# Patient Record
Sex: Female | Born: 1955
Health system: Southern US, Community
[De-identification: ages and names within clinical notes are randomized; demographics above are authoritative.]

## PROBLEM LIST (undated history)

## (undated) DIAGNOSIS — E669 Obesity, unspecified: Secondary | ICD-10-CM

## (undated) DIAGNOSIS — G4730 Sleep apnea, unspecified: Secondary | ICD-10-CM

## (undated) DIAGNOSIS — Z794 Long term (current) use of insulin: Secondary | ICD-10-CM

## (undated) DIAGNOSIS — F329 Major depressive disorder, single episode, unspecified: Secondary | ICD-10-CM

## (undated) DIAGNOSIS — K219 Gastro-esophageal reflux disease without esophagitis: Secondary | ICD-10-CM

## (undated) DIAGNOSIS — M549 Dorsalgia, unspecified: Secondary | ICD-10-CM

## (undated) DIAGNOSIS — F32A Depression, unspecified: Secondary | ICD-10-CM

## (undated) DIAGNOSIS — M1990 Unspecified osteoarthritis, unspecified site: Secondary | ICD-10-CM

## (undated) DIAGNOSIS — E119 Type 2 diabetes mellitus without complications: Secondary | ICD-10-CM

## (undated) DIAGNOSIS — E785 Hyperlipidemia, unspecified: Secondary | ICD-10-CM

## (undated) DIAGNOSIS — I10 Essential (primary) hypertension: Secondary | ICD-10-CM

## (undated) DIAGNOSIS — G8929 Other chronic pain: Secondary | ICD-10-CM

## (undated) DIAGNOSIS — Z789 Other specified health status: Secondary | ICD-10-CM

## (undated) HISTORY — DX: Depression, unspecified: F32.A

## (undated) HISTORY — DX: Other chronic pain: G89.29

## (undated) HISTORY — DX: Sleep apnea, unspecified: G47.30

## (undated) HISTORY — DX: Unspecified osteoarthritis, unspecified site: M19.90

## (undated) HISTORY — PX: ABDOMINAL HYSTERECTOMY: SHX81

## (undated) HISTORY — DX: Type 2 diabetes mellitus without complications: E11.9

## (undated) HISTORY — DX: Essential (primary) hypertension: I10

## (undated) HISTORY — DX: Dorsalgia, unspecified: M54.9

## (undated) HISTORY — DX: Major depressive disorder, single episode, unspecified: F32.9

## (undated) HISTORY — DX: Obesity, unspecified: E66.9

## (undated) HISTORY — DX: Hyperlipidemia, unspecified: E78.5

## (undated) HISTORY — DX: Long term (current) use of insulin: Z79.4

---

## 1998-01-05 ENCOUNTER — Ambulatory Visit: Admission: RE | Admit: 1998-01-05 | Discharge: 1998-01-05 | Payer: Self-pay

## 1999-05-02 ENCOUNTER — Inpatient Hospital Stay (HOSPITAL_COMMUNITY): Admission: EM | Admit: 1999-05-02 | Discharge: 1999-05-05 | Payer: Self-pay | Admitting: Emergency Medicine

## 1999-05-03 ENCOUNTER — Encounter: Payer: Self-pay | Admitting: Internal Medicine

## 1999-05-05 ENCOUNTER — Encounter: Payer: Self-pay | Admitting: Internal Medicine

## 1999-09-14 ENCOUNTER — Emergency Department (HOSPITAL_COMMUNITY): Admission: EM | Admit: 1999-09-14 | Discharge: 1999-09-14 | Payer: Self-pay | Admitting: Emergency Medicine

## 1999-09-28 ENCOUNTER — Emergency Department (HOSPITAL_COMMUNITY): Admission: EM | Admit: 1999-09-28 | Discharge: 1999-09-28 | Payer: Self-pay | Admitting: Emergency Medicine

## 1999-10-30 ENCOUNTER — Emergency Department (HOSPITAL_COMMUNITY): Admission: EM | Admit: 1999-10-30 | Discharge: 1999-10-30 | Payer: Self-pay | Admitting: Emergency Medicine

## 1999-11-08 ENCOUNTER — Emergency Department (HOSPITAL_COMMUNITY): Admission: EM | Admit: 1999-11-08 | Discharge: 1999-11-08 | Payer: Self-pay | Admitting: Emergency Medicine

## 2000-03-08 ENCOUNTER — Inpatient Hospital Stay (HOSPITAL_COMMUNITY): Admission: EM | Admit: 2000-03-08 | Discharge: 2000-03-10 | Payer: Self-pay | Admitting: *Deleted

## 2000-04-20 HISTORY — PX: VESICOVAGINAL FISTULA CLOSURE W/ TAH: SUR271

## 2000-08-03 ENCOUNTER — Emergency Department (HOSPITAL_COMMUNITY): Admission: EM | Admit: 2000-08-03 | Discharge: 2000-08-03 | Payer: Self-pay | Admitting: *Deleted

## 2000-08-09 ENCOUNTER — Other Ambulatory Visit: Admission: RE | Admit: 2000-08-09 | Discharge: 2000-08-09 | Payer: Self-pay | Admitting: Obstetrics and Gynecology

## 2000-08-12 ENCOUNTER — Emergency Department (HOSPITAL_COMMUNITY): Admission: EM | Admit: 2000-08-12 | Discharge: 2000-08-12 | Payer: Self-pay | Admitting: Emergency Medicine

## 2000-08-17 ENCOUNTER — Encounter: Payer: Self-pay | Admitting: Family Medicine

## 2000-08-17 ENCOUNTER — Ambulatory Visit (HOSPITAL_COMMUNITY): Admission: RE | Admit: 2000-08-17 | Discharge: 2000-08-17 | Payer: Self-pay | Admitting: Family Medicine

## 2000-08-19 ENCOUNTER — Emergency Department (HOSPITAL_COMMUNITY): Admission: EM | Admit: 2000-08-19 | Discharge: 2000-08-19 | Payer: Self-pay | Admitting: *Deleted

## 2000-08-30 ENCOUNTER — Emergency Department (HOSPITAL_COMMUNITY): Admission: EM | Admit: 2000-08-30 | Discharge: 2000-08-30 | Payer: Self-pay | Admitting: Emergency Medicine

## 2000-09-06 ENCOUNTER — Encounter: Payer: Self-pay | Admitting: General Surgery

## 2000-09-07 ENCOUNTER — Inpatient Hospital Stay (HOSPITAL_COMMUNITY): Admission: RE | Admit: 2000-09-07 | Discharge: 2000-09-10 | Payer: Self-pay | Admitting: General Surgery

## 2000-09-17 ENCOUNTER — Encounter: Payer: Self-pay | Admitting: Emergency Medicine

## 2000-09-17 ENCOUNTER — Emergency Department (HOSPITAL_COMMUNITY): Admission: EM | Admit: 2000-09-17 | Discharge: 2000-09-17 | Payer: Self-pay | Admitting: Emergency Medicine

## 2001-09-23 ENCOUNTER — Ambulatory Visit (HOSPITAL_COMMUNITY): Admission: RE | Admit: 2001-09-23 | Discharge: 2001-09-23 | Payer: Self-pay | Admitting: Family Medicine

## 2001-09-25 ENCOUNTER — Ambulatory Visit (HOSPITAL_COMMUNITY): Admission: RE | Admit: 2001-09-25 | Discharge: 2001-09-25 | Payer: Self-pay | Admitting: Family Medicine

## 2001-09-25 ENCOUNTER — Encounter: Payer: Self-pay | Admitting: Internal Medicine

## 2001-09-25 ENCOUNTER — Emergency Department (HOSPITAL_COMMUNITY): Admission: EM | Admit: 2001-09-25 | Discharge: 2001-09-25 | Payer: Self-pay | Admitting: Internal Medicine

## 2001-09-27 ENCOUNTER — Ambulatory Visit (HOSPITAL_COMMUNITY): Admission: RE | Admit: 2001-09-27 | Discharge: 2001-09-27 | Payer: Self-pay | Admitting: Family Medicine

## 2001-10-12 ENCOUNTER — Ambulatory Visit: Admission: RE | Admit: 2001-10-12 | Discharge: 2001-10-12 | Payer: Self-pay | Admitting: Family Medicine

## 2001-10-28 ENCOUNTER — Ambulatory Visit (HOSPITAL_COMMUNITY): Admission: RE | Admit: 2001-10-28 | Discharge: 2001-10-28 | Payer: Self-pay | Admitting: Family Medicine

## 2001-12-04 ENCOUNTER — Encounter: Payer: Self-pay | Admitting: Emergency Medicine

## 2001-12-04 ENCOUNTER — Emergency Department (HOSPITAL_COMMUNITY): Admission: EM | Admit: 2001-12-04 | Discharge: 2001-12-04 | Payer: Self-pay | Admitting: Emergency Medicine

## 2001-12-20 ENCOUNTER — Encounter: Payer: Self-pay | Admitting: Family Medicine

## 2001-12-20 ENCOUNTER — Ambulatory Visit (HOSPITAL_COMMUNITY): Admission: RE | Admit: 2001-12-20 | Discharge: 2001-12-20 | Payer: Self-pay | Admitting: Family Medicine

## 2001-12-21 ENCOUNTER — Emergency Department (HOSPITAL_COMMUNITY): Admission: EM | Admit: 2001-12-21 | Discharge: 2001-12-21 | Payer: Self-pay | Admitting: *Deleted

## 2001-12-21 ENCOUNTER — Encounter: Payer: Self-pay | Admitting: *Deleted

## 2002-04-20 HISTORY — PX: OTHER SURGICAL HISTORY: SHX169

## 2002-06-18 ENCOUNTER — Encounter: Payer: Self-pay | Admitting: Emergency Medicine

## 2002-06-18 ENCOUNTER — Emergency Department (HOSPITAL_COMMUNITY): Admission: EM | Admit: 2002-06-18 | Discharge: 2002-06-18 | Payer: Self-pay | Admitting: Emergency Medicine

## 2002-06-30 ENCOUNTER — Encounter (HOSPITAL_COMMUNITY): Admission: RE | Admit: 2002-06-30 | Discharge: 2002-07-30 | Payer: Self-pay | Admitting: *Deleted

## 2002-06-30 ENCOUNTER — Encounter: Payer: Self-pay | Admitting: *Deleted

## 2002-10-26 ENCOUNTER — Encounter: Payer: Self-pay | Admitting: Orthopedic Surgery

## 2002-10-30 ENCOUNTER — Ambulatory Visit (HOSPITAL_COMMUNITY): Admission: RE | Admit: 2002-10-30 | Discharge: 2002-10-31 | Payer: Self-pay | Admitting: Orthopedic Surgery

## 2003-01-04 ENCOUNTER — Encounter (HOSPITAL_COMMUNITY): Admission: RE | Admit: 2003-01-04 | Discharge: 2003-02-03 | Payer: Self-pay | Admitting: Orthopedic Surgery

## 2003-02-07 ENCOUNTER — Encounter (HOSPITAL_COMMUNITY): Admission: RE | Admit: 2003-02-07 | Discharge: 2003-03-09 | Payer: Self-pay | Admitting: *Deleted

## 2003-05-01 ENCOUNTER — Emergency Department (HOSPITAL_COMMUNITY): Admission: EM | Admit: 2003-05-01 | Discharge: 2003-05-01 | Payer: Self-pay | Admitting: Emergency Medicine

## 2003-05-03 ENCOUNTER — Ambulatory Visit (HOSPITAL_COMMUNITY): Admission: RE | Admit: 2003-05-03 | Discharge: 2003-05-03 | Payer: Self-pay | Admitting: *Deleted

## 2003-10-26 ENCOUNTER — Emergency Department (HOSPITAL_COMMUNITY): Admission: EM | Admit: 2003-10-26 | Discharge: 2003-10-27 | Payer: Self-pay | Admitting: Emergency Medicine

## 2004-04-04 ENCOUNTER — Ambulatory Visit: Payer: Self-pay | Admitting: Family Medicine

## 2004-04-24 ENCOUNTER — Ambulatory Visit: Payer: Self-pay | Admitting: Orthopedic Surgery

## 2004-04-29 ENCOUNTER — Ambulatory Visit (HOSPITAL_COMMUNITY): Admission: RE | Admit: 2004-04-29 | Discharge: 2004-04-29 | Payer: Self-pay | Admitting: Orthopedic Surgery

## 2004-05-02 ENCOUNTER — Ambulatory Visit: Payer: Self-pay | Admitting: Family Medicine

## 2004-05-21 ENCOUNTER — Ambulatory Visit: Payer: Self-pay | Admitting: Orthopedic Surgery

## 2004-06-24 ENCOUNTER — Ambulatory Visit: Payer: Self-pay | Admitting: Family Medicine

## 2004-07-15 ENCOUNTER — Encounter: Admission: RE | Admit: 2004-07-15 | Discharge: 2004-07-15 | Payer: Self-pay | Admitting: Sports Medicine

## 2004-07-20 ENCOUNTER — Ambulatory Visit: Payer: Self-pay | Admitting: Internal Medicine

## 2004-07-20 ENCOUNTER — Ambulatory Visit: Payer: Self-pay | Admitting: *Deleted

## 2004-07-20 ENCOUNTER — Inpatient Hospital Stay (HOSPITAL_COMMUNITY): Admission: EM | Admit: 2004-07-20 | Discharge: 2004-07-22 | Payer: Self-pay | Admitting: Emergency Medicine

## 2004-07-21 ENCOUNTER — Ambulatory Visit: Payer: Self-pay | Admitting: *Deleted

## 2004-08-26 ENCOUNTER — Emergency Department (HOSPITAL_COMMUNITY): Admission: EM | Admit: 2004-08-26 | Discharge: 2004-08-26 | Payer: Self-pay | Admitting: Emergency Medicine

## 2004-09-04 ENCOUNTER — Ambulatory Visit: Payer: Self-pay | Admitting: Family Medicine

## 2004-10-26 ENCOUNTER — Emergency Department (HOSPITAL_COMMUNITY): Admission: EM | Admit: 2004-10-26 | Discharge: 2004-10-26 | Payer: Self-pay | Admitting: Emergency Medicine

## 2004-11-07 ENCOUNTER — Ambulatory Visit: Payer: Self-pay | Admitting: Family Medicine

## 2004-11-18 ENCOUNTER — Ambulatory Visit: Payer: Self-pay | Admitting: *Deleted

## 2004-11-26 ENCOUNTER — Emergency Department (HOSPITAL_COMMUNITY): Admission: EM | Admit: 2004-11-26 | Discharge: 2004-11-26 | Payer: Self-pay | Admitting: Emergency Medicine

## 2004-12-30 ENCOUNTER — Ambulatory Visit: Payer: Self-pay | Admitting: *Deleted

## 2004-12-30 ENCOUNTER — Encounter (HOSPITAL_COMMUNITY): Admission: RE | Admit: 2004-12-30 | Discharge: 2004-12-31 | Payer: Self-pay | Admitting: *Deleted

## 2005-01-13 ENCOUNTER — Ambulatory Visit: Payer: Self-pay | Admitting: *Deleted

## 2005-01-27 ENCOUNTER — Ambulatory Visit: Payer: Self-pay | Admitting: Family Medicine

## 2005-04-20 HISTORY — PX: OTHER SURGICAL HISTORY: SHX169

## 2005-04-28 ENCOUNTER — Ambulatory Visit: Payer: Self-pay | Admitting: Family Medicine

## 2005-06-22 ENCOUNTER — Ambulatory Visit: Payer: Self-pay | Admitting: Family Medicine

## 2005-08-03 ENCOUNTER — Ambulatory Visit (HOSPITAL_COMMUNITY): Admission: RE | Admit: 2005-08-03 | Discharge: 2005-08-04 | Payer: Self-pay | Admitting: Orthopedic Surgery

## 2005-08-27 ENCOUNTER — Ambulatory Visit: Payer: Self-pay | Admitting: Family Medicine

## 2006-04-29 ENCOUNTER — Ambulatory Visit: Payer: Self-pay | Admitting: Family Medicine

## 2006-04-29 LAB — CONVERTED CEMR LAB
Albumin: 3.9 g/dL (ref 3.5–5.2)
BUN: 14 mg/dL (ref 6–23)
Basophils Absolute: 0 10*3/uL (ref 0.0–0.1)
CO2: 23 meq/L (ref 19–32)
Calcium: 8.9 mg/dL (ref 8.4–10.5)
Chloride: 102 meq/L (ref 96–112)
Cholesterol: 188 mg/dL (ref 0–200)
Creatinine, Ser: 0.65 mg/dL (ref 0.40–1.20)
Hgb A1c MFr Bld: 8.5 % — ABNORMAL HIGH (ref 4.6–6.1)
Lymphocytes Relative: 31 % (ref 12–46)
Lymphs Abs: 2 10*3/uL (ref 0.7–3.3)
MCHC: 31.6 g/dL (ref 30.0–36.0)
Monocytes Absolute: 0.4 10*3/uL (ref 0.2–0.7)
Monocytes Relative: 7 % (ref 3–11)
Neutro Abs: 3.9 10*3/uL (ref 1.7–7.7)
Neutrophils Relative %: 61 % (ref 43–77)
Total Bilirubin: 0.5 mg/dL (ref 0.3–1.2)
Triglycerides: 120 mg/dL (ref <150)

## 2006-07-20 ENCOUNTER — Ambulatory Visit: Payer: Self-pay | Admitting: Family Medicine

## 2006-07-20 LAB — CONVERTED CEMR LAB
Albumin: 3.8 g/dL (ref 3.5–5.2)
Bilirubin, Direct: 0.1 mg/dL (ref 0.0–0.3)
CO2: 27 meq/L (ref 19–32)
Chloride: 102 meq/L (ref 96–112)
Cholesterol: 202 mg/dL — ABNORMAL HIGH (ref 0–200)
Hgb A1c MFr Bld: 8 % — ABNORMAL HIGH (ref 4.6–6.1)
Indirect Bilirubin: 0.3 mg/dL (ref 0.0–0.9)
Sodium: 138 meq/L (ref 135–145)
Total CHOL/HDL Ratio: 3.7
Total Protein: 7.3 g/dL (ref 6.0–8.3)
Triglycerides: 77 mg/dL (ref <150)
VLDL: 15 mg/dL (ref 0–40)

## 2006-07-30 ENCOUNTER — Ambulatory Visit (HOSPITAL_COMMUNITY): Admission: RE | Admit: 2006-07-30 | Discharge: 2006-07-30 | Payer: Self-pay | Admitting: Family Medicine

## 2006-10-07 ENCOUNTER — Ambulatory Visit: Payer: Self-pay | Admitting: Family Medicine

## 2006-10-09 ENCOUNTER — Encounter: Payer: Self-pay | Admitting: Family Medicine

## 2006-10-09 LAB — CONVERTED CEMR LAB
Candida species: NEGATIVE
Chlamydia, DNA Probe: NEGATIVE
GC Probe Amp, Genital: NEGATIVE

## 2007-02-01 ENCOUNTER — Ambulatory Visit: Payer: Self-pay | Admitting: Family Medicine

## 2007-02-03 ENCOUNTER — Encounter: Payer: Self-pay | Admitting: Family Medicine

## 2007-04-15 ENCOUNTER — Emergency Department (HOSPITAL_COMMUNITY): Admission: EM | Admit: 2007-04-15 | Discharge: 2007-04-15 | Payer: Self-pay | Admitting: Emergency Medicine

## 2007-04-21 ENCOUNTER — Encounter: Payer: Self-pay | Admitting: Family Medicine

## 2007-05-05 ENCOUNTER — Ambulatory Visit: Payer: Self-pay | Admitting: Family Medicine

## 2007-06-04 ENCOUNTER — Emergency Department (HOSPITAL_COMMUNITY): Admission: EM | Admit: 2007-06-04 | Discharge: 2007-06-04 | Payer: Self-pay | Admitting: Emergency Medicine

## 2007-06-07 ENCOUNTER — Ambulatory Visit: Payer: Self-pay | Admitting: Family Medicine

## 2007-07-05 ENCOUNTER — Ambulatory Visit: Payer: Self-pay | Admitting: Family Medicine

## 2007-07-15 DIAGNOSIS — E109 Type 1 diabetes mellitus without complications: Secondary | ICD-10-CM | POA: Insufficient documentation

## 2007-07-15 DIAGNOSIS — M549 Dorsalgia, unspecified: Secondary | ICD-10-CM | POA: Insufficient documentation

## 2007-07-15 DIAGNOSIS — F329 Major depressive disorder, single episode, unspecified: Secondary | ICD-10-CM

## 2007-07-15 DIAGNOSIS — G4730 Sleep apnea, unspecified: Secondary | ICD-10-CM | POA: Insufficient documentation

## 2007-07-15 DIAGNOSIS — I10 Essential (primary) hypertension: Secondary | ICD-10-CM | POA: Insufficient documentation

## 2007-07-15 DIAGNOSIS — E669 Obesity, unspecified: Secondary | ICD-10-CM

## 2007-07-15 DIAGNOSIS — J45909 Unspecified asthma, uncomplicated: Secondary | ICD-10-CM | POA: Insufficient documentation

## 2007-07-15 DIAGNOSIS — E785 Hyperlipidemia, unspecified: Secondary | ICD-10-CM

## 2007-08-03 ENCOUNTER — Ambulatory Visit: Payer: Self-pay | Admitting: Family Medicine

## 2007-08-03 LAB — CONVERTED CEMR LAB
ALT: 15 units/L (ref 0–35)
AST: 13 units/L (ref 0–37)
Alkaline Phosphatase: 47 units/L (ref 39–117)
BUN: 11 mg/dL (ref 6–23)
Chloride: 102 meq/L (ref 96–112)
Cholesterol: 173 mg/dL (ref 0–200)
Creatinine, Ser: 0.72 mg/dL (ref 0.40–1.20)
Lymphocytes Relative: 32 % (ref 12–46)
Lymphs Abs: 2.3 10*3/uL (ref 0.7–4.0)
MCV: 90.4 fL (ref 78.0–100.0)
Monocytes Relative: 7 % (ref 3–12)
Neutro Abs: 4.3 10*3/uL (ref 1.7–7.7)
Neutrophils Relative %: 60 % (ref 43–77)
Sodium: 141 meq/L (ref 135–145)
Total Bilirubin: 0.5 mg/dL (ref 0.3–1.2)
Total Protein: 6.7 g/dL (ref 6.0–8.3)
Triglycerides: 129 mg/dL (ref <150)

## 2007-08-05 ENCOUNTER — Encounter: Payer: Self-pay | Admitting: Family Medicine

## 2007-09-19 ENCOUNTER — Emergency Department (HOSPITAL_COMMUNITY): Admission: EM | Admit: 2007-09-19 | Discharge: 2007-09-19 | Payer: Self-pay | Admitting: Emergency Medicine

## 2007-09-29 ENCOUNTER — Ambulatory Visit: Payer: Self-pay | Admitting: Family Medicine

## 2007-09-29 LAB — CONVERTED CEMR LAB

## 2007-09-30 ENCOUNTER — Encounter: Payer: Self-pay | Admitting: Family Medicine

## 2007-09-30 LAB — CONVERTED CEMR LAB
Candida species: NEGATIVE
Chlamydia, DNA Probe: NEGATIVE
GC Probe Amp, Genital: NEGATIVE
Gardnerella vaginalis: NEGATIVE
Trichomonal Vaginitis: NEGATIVE

## 2007-11-03 ENCOUNTER — Encounter: Admission: RE | Admit: 2007-11-03 | Discharge: 2007-11-03 | Payer: Self-pay | Admitting: Sports Medicine

## 2007-11-16 ENCOUNTER — Encounter: Payer: Self-pay | Admitting: Family Medicine

## 2007-11-16 ENCOUNTER — Ambulatory Visit: Payer: Self-pay | Admitting: Family Medicine

## 2007-11-16 DIAGNOSIS — M25569 Pain in unspecified knee: Secondary | ICD-10-CM | POA: Insufficient documentation

## 2007-11-16 LAB — CONVERTED CEMR LAB: Hgb A1c MFr Bld: 8.9 %

## 2007-12-19 ENCOUNTER — Encounter: Payer: Self-pay | Admitting: Family Medicine

## 2007-12-30 ENCOUNTER — Encounter: Payer: Self-pay | Admitting: Family Medicine

## 2008-01-17 ENCOUNTER — Encounter: Payer: Self-pay | Admitting: Family Medicine

## 2008-01-23 ENCOUNTER — Encounter: Payer: Self-pay | Admitting: Family Medicine

## 2008-02-02 ENCOUNTER — Ambulatory Visit: Payer: Self-pay | Admitting: Family Medicine

## 2008-02-02 LAB — CONVERTED CEMR LAB: Glucose, Bld: 343 mg/dL

## 2008-02-13 ENCOUNTER — Encounter: Payer: Self-pay | Admitting: Family Medicine

## 2008-03-13 ENCOUNTER — Ambulatory Visit: Payer: Self-pay | Admitting: Family Medicine

## 2008-03-13 LAB — CONVERTED CEMR LAB
Bilirubin Urine: NEGATIVE
Glucose, Urine, Semiquant: NEGATIVE
Hgb A1c MFr Bld: 8.6 %
Protein, U semiquant: NEGATIVE
Urobilinogen, UA: 0.2
WBC Urine, dipstick: NEGATIVE
pH: 5.5

## 2008-03-14 ENCOUNTER — Encounter: Payer: Self-pay | Admitting: Family Medicine

## 2008-03-21 ENCOUNTER — Emergency Department (HOSPITAL_COMMUNITY): Admission: EM | Admit: 2008-03-21 | Discharge: 2008-03-21 | Payer: Self-pay | Admitting: Emergency Medicine

## 2008-03-23 ENCOUNTER — Encounter: Payer: Self-pay | Admitting: Family Medicine

## 2008-04-02 ENCOUNTER — Encounter: Payer: Self-pay | Admitting: Family Medicine

## 2008-04-06 ENCOUNTER — Encounter: Payer: Self-pay | Admitting: Family Medicine

## 2008-04-18 ENCOUNTER — Telehealth: Payer: Self-pay | Admitting: Family Medicine

## 2008-04-23 ENCOUNTER — Telehealth: Payer: Self-pay | Admitting: Family Medicine

## 2008-05-16 ENCOUNTER — Encounter: Payer: Self-pay | Admitting: Family Medicine

## 2008-05-31 ENCOUNTER — Encounter: Payer: Self-pay | Admitting: Family Medicine

## 2008-06-13 ENCOUNTER — Ambulatory Visit: Payer: Self-pay | Admitting: Family Medicine

## 2008-06-13 LAB — CONVERTED CEMR LAB: Glucose, Bld: 238 mg/dL

## 2008-06-14 ENCOUNTER — Telehealth: Payer: Self-pay | Admitting: Family Medicine

## 2008-06-15 ENCOUNTER — Encounter: Payer: Self-pay | Admitting: Family Medicine

## 2008-06-16 ENCOUNTER — Emergency Department (HOSPITAL_COMMUNITY): Admission: EM | Admit: 2008-06-16 | Discharge: 2008-06-16 | Payer: Self-pay | Admitting: Emergency Medicine

## 2008-06-27 ENCOUNTER — Telehealth: Payer: Self-pay | Admitting: Family Medicine

## 2008-06-28 ENCOUNTER — Ambulatory Visit: Payer: Self-pay | Admitting: Family Medicine

## 2008-06-28 LAB — CONVERTED CEMR LAB: Glucose, Bld: 122 mg/dL

## 2008-07-10 ENCOUNTER — Telehealth: Payer: Self-pay | Admitting: Family Medicine

## 2008-07-11 ENCOUNTER — Encounter: Payer: Self-pay | Admitting: Family Medicine

## 2008-07-12 ENCOUNTER — Ambulatory Visit: Payer: Self-pay | Admitting: Family Medicine

## 2008-07-12 DIAGNOSIS — N3000 Acute cystitis without hematuria: Secondary | ICD-10-CM

## 2008-07-12 LAB — CONVERTED CEMR LAB
Bilirubin Urine: NEGATIVE
Blood in Urine, dipstick: NEGATIVE
Glucose, Bld: 133 mg/dL
Glucose, Urine, Semiquant: NEGATIVE
Nitrite: NEGATIVE
Protein, U semiquant: NEGATIVE
Specific Gravity, Urine: 1.02
Urobilinogen, UA: 0.2
WBC Urine, dipstick: NEGATIVE
pH: 6

## 2008-07-17 ENCOUNTER — Encounter: Payer: Self-pay | Admitting: Family Medicine

## 2008-07-26 ENCOUNTER — Encounter: Payer: Self-pay | Admitting: Family Medicine

## 2008-07-27 ENCOUNTER — Encounter: Payer: Self-pay | Admitting: Family Medicine

## 2008-08-10 ENCOUNTER — Telehealth: Payer: Self-pay | Admitting: Family Medicine

## 2008-08-21 ENCOUNTER — Ambulatory Visit: Payer: Self-pay | Admitting: Family Medicine

## 2008-08-21 LAB — CONVERTED CEMR LAB
Bilirubin Urine: NEGATIVE
Glucose, Bld: 400 mg/dL
Glucose, Urine, Semiquant: 250
Ketones, urine, test strip: NEGATIVE
Microalb, Ur: 0.5 mg/dL (ref 0.00–1.89)
Specific Gravity, Urine: 1.025
pH: 6

## 2008-09-14 ENCOUNTER — Encounter: Payer: Self-pay | Admitting: Internal Medicine

## 2008-09-27 ENCOUNTER — Encounter: Payer: Self-pay | Admitting: Family Medicine

## 2008-10-15 ENCOUNTER — Telehealth: Payer: Self-pay | Admitting: Family Medicine

## 2008-10-18 ENCOUNTER — Ambulatory Visit: Payer: Self-pay | Admitting: Family Medicine

## 2008-10-18 DIAGNOSIS — R5383 Other fatigue: Secondary | ICD-10-CM

## 2008-10-18 DIAGNOSIS — R5381 Other malaise: Secondary | ICD-10-CM | POA: Insufficient documentation

## 2008-10-18 LAB — CONVERTED CEMR LAB: Glucose, Bld: 90 mg/dL

## 2008-10-19 ENCOUNTER — Encounter: Payer: Self-pay | Admitting: Family Medicine

## 2008-10-19 ENCOUNTER — Telehealth: Payer: Self-pay | Admitting: Family Medicine

## 2008-10-19 LAB — CONVERTED CEMR LAB: Retic Ct Pct: 1.3 % (ref 0.4–3.1)

## 2008-10-24 ENCOUNTER — Encounter: Payer: Self-pay | Admitting: Family Medicine

## 2008-10-29 ENCOUNTER — Encounter: Payer: Self-pay | Admitting: Family Medicine

## 2008-11-05 ENCOUNTER — Encounter: Payer: Self-pay | Admitting: Family Medicine

## 2008-12-04 ENCOUNTER — Emergency Department (HOSPITAL_COMMUNITY): Admission: EM | Admit: 2008-12-04 | Discharge: 2008-12-04 | Payer: Self-pay | Admitting: Emergency Medicine

## 2008-12-14 ENCOUNTER — Ambulatory Visit: Payer: Self-pay | Admitting: Family Medicine

## 2008-12-14 DIAGNOSIS — N62 Hypertrophy of breast: Secondary | ICD-10-CM | POA: Insufficient documentation

## 2008-12-14 LAB — CONVERTED CEMR LAB: Glucose, Bld: 124 mg/dL

## 2008-12-25 ENCOUNTER — Encounter: Payer: Self-pay | Admitting: Family Medicine

## 2009-01-14 ENCOUNTER — Ambulatory Visit: Payer: Self-pay | Admitting: Family Medicine

## 2009-01-14 LAB — CONVERTED CEMR LAB
Glucose, Bld: 239 mg/dL
Hgb A1c MFr Bld: 8.1 %

## 2009-01-17 ENCOUNTER — Telehealth: Payer: Self-pay | Admitting: Family Medicine

## 2009-02-06 ENCOUNTER — Telehealth: Payer: Self-pay | Admitting: Family Medicine

## 2009-02-06 ENCOUNTER — Ambulatory Visit: Payer: Self-pay | Admitting: Family Medicine

## 2009-02-07 DIAGNOSIS — F411 Generalized anxiety disorder: Secondary | ICD-10-CM | POA: Insufficient documentation

## 2009-02-07 LAB — CONVERTED CEMR LAB
AST: 12 units/L (ref 0–37)
Alkaline Phosphatase: 46 units/L (ref 39–117)
BUN: 14 mg/dL (ref 6–23)
Calcium: 8.6 mg/dL (ref 8.4–10.5)
Chloride: 105 meq/L (ref 96–112)
Cholesterol: 208 mg/dL — ABNORMAL HIGH (ref 0–200)
Creatinine, Ser: 0.8 mg/dL (ref 0.40–1.20)
Indirect Bilirubin: 0.3 mg/dL (ref 0.0–0.9)
LDL Cholesterol: 130 mg/dL — ABNORMAL HIGH (ref 0–99)
Total Protein: 6.6 g/dL (ref 6.0–8.3)
Triglycerides: 90 mg/dL (ref <150)

## 2009-02-18 ENCOUNTER — Encounter: Payer: Self-pay | Admitting: Family Medicine

## 2009-02-22 ENCOUNTER — Encounter: Payer: Self-pay | Admitting: Family Medicine

## 2009-03-04 ENCOUNTER — Ambulatory Visit: Payer: Self-pay | Admitting: Family Medicine

## 2009-03-04 LAB — CONVERTED CEMR LAB
Blood in Urine, dipstick: NEGATIVE
Ketones, urine, test strip: NEGATIVE
Nitrite: NEGATIVE
Urobilinogen, UA: 1

## 2009-03-06 ENCOUNTER — Ambulatory Visit: Payer: Self-pay | Admitting: Family Medicine

## 2009-03-06 LAB — CONVERTED CEMR LAB: Glucose, Bld: 199 mg/dL

## 2009-03-07 ENCOUNTER — Telehealth: Payer: Self-pay | Admitting: Family Medicine

## 2009-03-08 ENCOUNTER — Telehealth: Payer: Self-pay | Admitting: Family Medicine

## 2009-03-08 ENCOUNTER — Encounter: Payer: Self-pay | Admitting: Family Medicine

## 2009-03-11 ENCOUNTER — Ambulatory Visit: Payer: Self-pay | Admitting: Family Medicine

## 2009-03-16 ENCOUNTER — Emergency Department (HOSPITAL_COMMUNITY): Admission: EM | Admit: 2009-03-16 | Discharge: 2009-03-16 | Payer: Self-pay | Admitting: Emergency Medicine

## 2009-03-16 ENCOUNTER — Encounter: Payer: Self-pay | Admitting: Family Medicine

## 2009-03-18 ENCOUNTER — Ambulatory Visit: Payer: Self-pay | Admitting: Family Medicine

## 2009-03-18 LAB — CONVERTED CEMR LAB: Glucose, Bld: 271 mg/dL

## 2009-03-19 ENCOUNTER — Telehealth: Payer: Self-pay | Admitting: Family Medicine

## 2009-03-20 ENCOUNTER — Telehealth: Payer: Self-pay | Admitting: Family Medicine

## 2009-03-24 DIAGNOSIS — E162 Hypoglycemia, unspecified: Secondary | ICD-10-CM | POA: Insufficient documentation

## 2009-03-27 ENCOUNTER — Telehealth: Payer: Self-pay | Admitting: Family Medicine

## 2009-04-01 ENCOUNTER — Telehealth: Payer: Self-pay | Admitting: Family Medicine

## 2009-04-03 ENCOUNTER — Telehealth: Payer: Self-pay | Admitting: Family Medicine

## 2009-04-04 ENCOUNTER — Telehealth: Payer: Self-pay | Admitting: Family Medicine

## 2009-04-08 ENCOUNTER — Ambulatory Visit: Payer: Self-pay | Admitting: Family Medicine

## 2009-04-08 LAB — CONVERTED CEMR LAB: Glucose, Bld: 126 mg/dL

## 2009-04-10 ENCOUNTER — Ambulatory Visit (HOSPITAL_COMMUNITY): Admission: RE | Admit: 2009-04-10 | Discharge: 2009-04-10 | Payer: Self-pay | Admitting: Family Medicine

## 2009-04-16 ENCOUNTER — Encounter: Payer: Self-pay | Admitting: Family Medicine

## 2009-04-23 ENCOUNTER — Telehealth: Payer: Self-pay | Admitting: Family Medicine

## 2009-05-17 ENCOUNTER — Ambulatory Visit: Payer: Self-pay | Admitting: Family Medicine

## 2009-05-19 LAB — CONVERTED CEMR LAB
ALT: 13 units/L (ref 0–35)
AST: 18 units/L (ref 0–37)
Albumin: 3.9 g/dL (ref 3.5–5.2)
Calcium: 9.1 mg/dL (ref 8.4–10.5)
Cholesterol: 190 mg/dL (ref 0–200)
HDL: 58 mg/dL (ref >39)
Sodium: 141 meq/L (ref 135–145)
Total CHOL/HDL Ratio: 3.3

## 2009-05-20 ENCOUNTER — Encounter (INDEPENDENT_AMBULATORY_CARE_PROVIDER_SITE_OTHER): Payer: Self-pay | Admitting: *Deleted

## 2009-06-13 ENCOUNTER — Ambulatory Visit: Payer: Self-pay | Admitting: Family Medicine

## 2009-06-13 ENCOUNTER — Encounter (INDEPENDENT_AMBULATORY_CARE_PROVIDER_SITE_OTHER): Payer: Self-pay | Admitting: *Deleted

## 2009-06-13 LAB — CONVERTED CEMR LAB
Glucose, Urine, Semiquant: NEGATIVE
WBC Urine, dipstick: NEGATIVE
pH: 6.5

## 2009-07-04 ENCOUNTER — Ambulatory Visit: Payer: Self-pay | Admitting: Family Medicine

## 2009-07-04 DIAGNOSIS — D179 Benign lipomatous neoplasm, unspecified: Secondary | ICD-10-CM | POA: Insufficient documentation

## 2009-08-13 ENCOUNTER — Encounter: Payer: Self-pay | Admitting: Family Medicine

## 2009-08-20 ENCOUNTER — Ambulatory Visit: Payer: Self-pay | Admitting: Family Medicine

## 2009-08-20 LAB — CONVERTED CEMR LAB
Glucose, Urine, Semiquant: NEGATIVE
Protein, U semiquant: NEGATIVE
Urobilinogen, UA: 0.2
WBC Urine, dipstick: NEGATIVE

## 2009-08-21 ENCOUNTER — Telehealth: Payer: Self-pay | Admitting: Family Medicine

## 2009-08-21 LAB — CONVERTED CEMR LAB
ALT: 12 units/L (ref 0–35)
AST: 12 units/L (ref 0–37)
Albumin: 4 g/dL (ref 3.5–5.2)
Alkaline Phosphatase: 38 units/L — ABNORMAL LOW (ref 39–117)
BUN: 18 mg/dL (ref 6–23)
Bilirubin, Direct: 0.1 mg/dL (ref 0.0–0.3)
CO2: 22 meq/L (ref 19–32)
Calcium: 9.2 mg/dL (ref 8.4–10.5)
Chloride: 106 meq/L (ref 96–112)
Creatinine, Ser: 0.84 mg/dL (ref 0.40–1.20)
Glucose, Bld: 119 mg/dL — ABNORMAL HIGH (ref 70–99)
Hgb A1c MFr Bld: 6.6 % — ABNORMAL HIGH (ref <5.7)
Indirect Bilirubin: 0.2 mg/dL (ref 0.0–0.9)
Potassium: 4.1 meq/L (ref 3.5–5.3)
Sodium: 138 meq/L (ref 135–145)
Total Bilirubin: 0.3 mg/dL (ref 0.3–1.2)
Total Protein: 6.4 g/dL (ref 6.0–8.3)
Vit D, 25-Hydroxy: 20 ng/mL — ABNORMAL LOW (ref 30–89)

## 2009-08-22 ENCOUNTER — Encounter: Payer: Self-pay | Admitting: Family Medicine

## 2009-08-22 LAB — CONVERTED CEMR LAB: Microalb Creat Ratio: 3 mg/g (ref 0.0–30.0)

## 2009-09-18 ENCOUNTER — Telehealth (INDEPENDENT_AMBULATORY_CARE_PROVIDER_SITE_OTHER): Payer: Self-pay | Admitting: *Deleted

## 2009-10-03 ENCOUNTER — Telehealth: Payer: Self-pay | Admitting: Family Medicine

## 2009-10-14 ENCOUNTER — Encounter: Admission: RE | Admit: 2009-10-14 | Discharge: 2009-10-14 | Payer: Self-pay | Admitting: Sports Medicine

## 2009-10-23 ENCOUNTER — Ambulatory Visit: Payer: Self-pay | Admitting: Family Medicine

## 2009-10-23 LAB — CONVERTED CEMR LAB
Blood in Urine, dipstick: NEGATIVE
Ketones, urine, test strip: NEGATIVE
Nitrite: NEGATIVE
Specific Gravity, Urine: 1.025
Urobilinogen, UA: 0.2

## 2009-10-28 ENCOUNTER — Telehealth: Payer: Self-pay | Admitting: Family Medicine

## 2009-10-28 ENCOUNTER — Emergency Department (HOSPITAL_COMMUNITY): Admission: EM | Admit: 2009-10-28 | Discharge: 2009-10-28 | Payer: Self-pay | Admitting: Emergency Medicine

## 2009-10-30 ENCOUNTER — Encounter: Payer: Self-pay | Admitting: Family Medicine

## 2009-11-03 ENCOUNTER — Emergency Department (HOSPITAL_COMMUNITY): Admission: EM | Admit: 2009-11-03 | Discharge: 2009-11-03 | Payer: Self-pay | Admitting: Emergency Medicine

## 2009-11-05 ENCOUNTER — Encounter: Payer: Self-pay | Admitting: Family Medicine

## 2009-11-07 ENCOUNTER — Encounter: Payer: Self-pay | Admitting: Family Medicine

## 2009-11-26 ENCOUNTER — Ambulatory Visit (HOSPITAL_COMMUNITY): Admission: RE | Admit: 2009-11-26 | Discharge: 2009-11-29 | Payer: Self-pay | Admitting: Orthopedic Surgery

## 2009-11-26 HISTORY — PX: OTHER SURGICAL HISTORY: SHX169

## 2009-12-04 ENCOUNTER — Ambulatory Visit: Payer: Self-pay | Admitting: Family Medicine

## 2009-12-04 DIAGNOSIS — M899 Disorder of bone, unspecified: Secondary | ICD-10-CM | POA: Insufficient documentation

## 2009-12-04 DIAGNOSIS — M949 Disorder of cartilage, unspecified: Secondary | ICD-10-CM

## 2009-12-05 ENCOUNTER — Encounter: Payer: Self-pay | Admitting: Family Medicine

## 2009-12-13 ENCOUNTER — Telehealth: Payer: Self-pay | Admitting: Family Medicine

## 2009-12-25 ENCOUNTER — Encounter: Payer: Self-pay | Admitting: Family Medicine

## 2009-12-25 ENCOUNTER — Telehealth: Payer: Self-pay | Admitting: Family Medicine

## 2009-12-30 ENCOUNTER — Telehealth: Payer: Self-pay | Admitting: Family Medicine

## 2010-01-03 ENCOUNTER — Telehealth: Payer: Self-pay | Admitting: Family Medicine

## 2010-01-08 ENCOUNTER — Ambulatory Visit: Payer: Self-pay | Admitting: Family Medicine

## 2010-01-08 LAB — CONVERTED CEMR LAB: Glucose, Bld: 135 mg/dL

## 2010-01-19 ENCOUNTER — Emergency Department (HOSPITAL_COMMUNITY): Admission: EM | Admit: 2010-01-19 | Discharge: 2010-01-19 | Payer: Self-pay | Admitting: Emergency Medicine

## 2010-01-29 ENCOUNTER — Telehealth: Payer: Self-pay | Admitting: Family Medicine

## 2010-02-02 ENCOUNTER — Emergency Department (HOSPITAL_COMMUNITY): Admission: EM | Admit: 2010-02-02 | Discharge: 2010-02-02 | Payer: Self-pay | Admitting: Emergency Medicine

## 2010-02-05 ENCOUNTER — Telehealth: Payer: Self-pay | Admitting: Family Medicine

## 2010-02-19 ENCOUNTER — Encounter (INDEPENDENT_AMBULATORY_CARE_PROVIDER_SITE_OTHER): Payer: Self-pay | Admitting: *Deleted

## 2010-02-21 ENCOUNTER — Encounter: Payer: Self-pay | Admitting: Family Medicine

## 2010-03-05 ENCOUNTER — Telehealth (INDEPENDENT_AMBULATORY_CARE_PROVIDER_SITE_OTHER): Payer: Self-pay | Admitting: *Deleted

## 2010-03-26 ENCOUNTER — Ambulatory Visit: Payer: Self-pay | Admitting: Family Medicine

## 2010-03-26 LAB — CONVERTED CEMR LAB
Bilirubin Urine: NEGATIVE
Glucose, Urine, Semiquant: NEGATIVE
Protein, U semiquant: NEGATIVE
Specific Gravity, Urine: 1.02
pH: 7

## 2010-04-02 ENCOUNTER — Encounter: Payer: Self-pay | Admitting: Family Medicine

## 2010-04-06 ENCOUNTER — Encounter: Payer: Self-pay | Admitting: Family Medicine

## 2010-04-07 LAB — CONVERTED CEMR LAB
ALT: 15 units/L (ref 0–35)
AST: 16 units/L (ref 0–37)
Albumin: 3.8 g/dL (ref 3.5–5.2)
Alkaline Phosphatase: 48 units/L (ref 39–117)
CO2: 26 meq/L (ref 19–32)
Calcium: 8.8 mg/dL (ref 8.4–10.5)
Cholesterol: 209 mg/dL — ABNORMAL HIGH (ref 0–200)
HDL: 64 mg/dL (ref >39)
Hgb A1c MFr Bld: 8.5 % — ABNORMAL HIGH (ref <5.7)
Sodium: 136 meq/L (ref 135–145)
Total Bilirubin: 0.6 mg/dL (ref 0.3–1.2)
Total CHOL/HDL Ratio: 3.3
Total Protein: 6.7 g/dL (ref 6.0–8.3)
Triglycerides: 66 mg/dL (ref <150)

## 2010-05-08 ENCOUNTER — Other Ambulatory Visit: Payer: Self-pay | Admitting: Family Medicine

## 2010-05-08 ENCOUNTER — Other Ambulatory Visit
Admission: RE | Admit: 2010-05-08 | Discharge: 2010-05-08 | Payer: Self-pay | Source: Home / Self Care | Admitting: Family Medicine

## 2010-05-08 ENCOUNTER — Ambulatory Visit
Admission: RE | Admit: 2010-05-08 | Discharge: 2010-05-08 | Payer: Self-pay | Source: Home / Self Care | Attending: Family Medicine | Admitting: Family Medicine

## 2010-05-10 ENCOUNTER — Encounter: Payer: Self-pay | Admitting: Family Medicine

## 2010-05-11 ENCOUNTER — Encounter: Payer: Self-pay | Admitting: *Deleted

## 2010-05-11 ENCOUNTER — Encounter: Payer: Self-pay | Admitting: Internal Medicine

## 2010-05-11 ENCOUNTER — Encounter: Payer: Self-pay | Admitting: Orthopedic Surgery

## 2010-05-11 ENCOUNTER — Encounter: Payer: Self-pay | Admitting: Family Medicine

## 2010-05-12 ENCOUNTER — Encounter: Payer: Self-pay | Admitting: Family Medicine

## 2010-05-15 ENCOUNTER — Telehealth: Payer: Self-pay | Admitting: Family Medicine

## 2010-05-20 NOTE — Letter (Signed)
Summary: MISC  MISC   Imported By: Lind Guest 09/17/2009 14:06:17  _____________________________________________________________________  External Attachment:    Type:   Image     Comment:   External Document

## 2010-05-20 NOTE — Letter (Signed)
Summary: X RAYS  X RAYS   Imported By: Lind Guest 09/17/2009 13:59:20  _____________________________________________________________________  External Attachment:    Type:   Image     Comment:   External Document

## 2010-05-20 NOTE — Letter (Signed)
Summary: MURPHY/WAINER  MURPHY/WAINER   Imported By: Lind Guest 01/02/2010 09:26:01  _____________________________________________________________________  External Attachment:    Type:   Image     Comment:   External Document

## 2010-05-20 NOTE — Letter (Signed)
Summary: PHONE NOTES  PHONE NOTES   Imported By: Lind Guest 09/17/2009 13:58:39  _____________________________________________________________________  External Attachment:    Type:   Image     Comment:   External Document

## 2010-05-20 NOTE — Letter (Signed)
Summary: no show  no show   Imported By: Lind Guest 02/21/2010 13:30:13  _____________________________________________________________________  External Attachment:    Type:   Image     Comment:   External Document

## 2010-05-20 NOTE — Progress Notes (Signed)
Summary: insurance on gastric bypass  Phone Note Call from Patient   Summary of Call: Lisa Todd stated that Dr. Lodema Hong needed to call [name] at [employer]. They stated they are not allowed to taqlk to pt needs to speak with Dr. [name] [name] for Dr. Lodema Hong call [facility] at [phone]. They have got all paper work and letters but need to talk with doc.  Initial call taken by: Rudene Anda,  April 23, 2009 10:45 AM  Follow-up for Phone Call        ged the numi called this number given it is a private cell phone the correct no needasto be provided Follow-up by: Syliva Overman MD,  April 23, 2009 5:49 PM  Additional Follow-up for Phone Call Additional follow up Details #1::        changed the number. it is [phone] [facility]p department Additional Follow-up by: Rudene Anda,  April 24, 2009 8:43 AM    Additional Follow-up for Phone Call Additional follow up Details #2::    this number has multiple options, when you get the appropriate person on the phone regarding this appeal pls call me to the phone, thanks Follow-up by: Syliva Overman MD,  April 29, 2009 12:39 PM  Additional Follow-up for Phone Call Additional follow up Details #3:: Details for Additional Follow-up Action Taken: I called the number provided and couldn't get anywhere, called [name] and she said to call [phone] and ask to speak to [employer]  people. I tried doing that but nobody knew what this would be about. And I got [name] but she is not with [employer], she is with the [facility] for weight loss surgery. Left msg for her to call ([phone]) Additional Follow-up by: Everitt Amber,  May 02, 2009 2:20 PM  noted 45 mins was spent on this call, no more time available, Pt can ask whoever she feels can help he to callhere directly with correct contact info left

## 2010-05-20 NOTE — Progress Notes (Signed)
Summary: bad cramps  Phone Note Call from Patient   Summary of Call: having really bad cramps in both legs. [phone] what can she do  Initial call taken by: Rudene Anda,  October 28, 2009 9:53 AM  Follow-up for Phone Call        patient crying and saying the cramps in her legs are soo bad, states she cant drive to get up here or hospital Follow-up by: Adella Hare LPN,  October 28, 2009 11:06 AM  Additional Follow-up for Phone Call Additional follow up Details #1::        advise stop simvastatin, start pravastatin [place], she needs cPK blood test top see if she has muscle damage,also do potassium, ca and magnesium also I am sending in a muscle relaxant for bedtime use as needed, pls ensure sheunderstands  pls stamp and fax rx Additional Follow-up by: Syliva Overman MD,  October 28, 2009 12:21 PM    Additional Follow-up for Phone Call Additional follow up Details #2::    patient went to er  patient aware cholesterol med being changed Follow-up by: Adella Hare LPN,  October 28, 2009 2:36 PM  New/Updated Medications: CYCLOBENZAPRINE HCL 10 MG TABS (CYCLOBENZAPRINE HCL) Take 1 tab by mouth at bedtime as needed PRAVACHOL 40 MG TABS (PRAVASTATIN SODIUM) one tablet at bedtime to start7/21/2011 Prescriptions: PRAVACHOL 40 MG TABS (PRAVASTATIN SODIUM) one tablet at bedtime to start7/21/2011  #30 x 4   Entered and Authorized by:   Syliva Overman MD   Signed by:   Syliva Overman MD on 10/28/2009   Method used:   Printed then faxed to ...       [facility] (retail)       [address]       [place], [place]  [address]       Ph: [phone]       Fax: [phone]   RxID:   [id number] CYCLOBENZAPRINE HCL 10 MG TABS (CYCLOBENZAPRINE HCL) Take 1 tab by mouth at bedtime as needed  #30 x 0   Entered and Authorized by:   Syliva Overman MD   Signed by:   Syliva Overman MD on 10/28/2009   Method used:   Printed then faxed to ...  [facility] (retail)       [address]       [place], [place]  [address]       Ph: [phone]       Fax: [phone]   RxID:   [id number]

## 2010-05-20 NOTE — Assessment & Plan Note (Signed)
Summary: office visit   Vital Signs:  Patient profile:   55 year old female Menstrual status:  hysterectomy Height:      63 inches Weight:      282 pounds BMI:     50.13 O2 Sat:      97 % Pulse rate:   83 / minute Pulse rhythm:   regular Resp:     16 per minute BP sitting:   122 / 76  (left arm) Cuff size:   xl  Vitals Entered By: Everitt Amber LPN (July 04, 2009 2:49 PM)  Nutrition Counseling: Patient's BMI is greater than 25 and therefore counseled on weight management options. CC: has a lump on her upper arm about the size of a grape, sometimes its tender   Primary Care Provider:  Syliva Overman MD  CC:  has a lump on her upper arm about the size of a grape and sometimes its tender.  History of Present Illness: Pt's min complaint today is of a swelling on her left arm, which is at times tender. She  has no h/o trauma to the area, and has not had this before. She contiues to be depressed and trearful about her weight. She has not joined the [facility] but continues daily exercise and watching her food intake. pt is requesting a referral to a local internist for pain manageet, I advised that since he is not a pan speciaist I do not think this appropriate, however an attempt will be made for her to get the requested referral.   Allergies: 1)  ! * Victoza  Review of Systems      See HPI General:  Complains of fatigue; denies chills and fever. Eyes:  Denies blurring and discharge. ENT:  Denies nasal congestion, postnasal drainage, sinus pressure, and sore throat. CV:  Denies chest pain or discomfort, palpitations, and swelling of feet. Resp:  Denies cough and sputum productive. GI:  Denies abdominal pain, constipation, diarrhea, nausea, and vomiting. GU:  Denies dysuria and urinary hesitancy. MS:  Complains of joint pain, low back pain, mid back pain, and stiffness; chronic, wants to see dr Carlus Pavlov since recommended by a friwend. Derm:  Complains of lesion(s); painkless nodule  on left  arm x  . Endo:  Denies excessive hunger and excessive thirst.  Physical Exam  General:  Well-developed,obese,in no acute distress; alert,appropriate and cooperative throughout examination HEENT: No facial asymmetry,  EOMI, No sinus tenderness, TM's Clear, oropharynx  pink and moist.   Chest: Clear to auscultation bilaterally.  CVS: S1, S2, No murmurs, No S3.   Abd: Soft, Nontender.  MS: Adequate ROM spine, hips, shoulders and knees.  Ext: No edema.   CNS: CN 2-12 intact, power tone and sensation normal throughout.   Skin: Intact, nlipoma/nodule on left forearm, tender to deep palpaption Psych: Good eye contact, normal affect.  Memory intact, depressed appearing.    Impression & Recommendations:  Problem # 1:  LIPOMA OF UNSPECIFIED SITE (ICD-214.9) Assessment Comment Only  Future Orders: Surgical Referral (Surgery) ... 07/08/2009  Problem # 2:  OBESITY, UNSPECIFIED (ICD-278.00) Assessment: Unchanged  Ht: 63 (07/04/2009)   Wt: 282 (07/04/2009)   BMI: 50.13 (07/04/2009)  Problem # 3:  HYPERTENSION (ICD-401.9) Assessment: Unchanged  Her updated medication list for this problem includes:    Cardizem La 420 Mg Tb24 (Diltiazem hcl coated beads) ..... One tab by mouth once daily  BP today: 122/76 Prior BP: 122/80 (06/13/2009)  Labs Reviewed: K+: 4.0 (05/17/2009) Creat: : 0.30 (05/17/2009)  Chol: 190 (05/17/2009)   HDL: 58 (05/17/2009)   LDL: 120 (05/17/2009)   TG: 59 (05/17/2009)  Problem # 4:  IDDM (ICD-250.01) Assessment: Comment Only  Her updated medication list for this problem includes:    Glipizide Xl 10 Mg Tb24 (Glipizide) .Marland Kitchen..Marland Kitchen Two tabs by mouth every morning    Lantus 100 Unit/ml Soln (Insulin glargine) ..... Inject 100 units subcutaneously once daily    Humalog Mix 75/25 75-25 % Susp (Insulin lispro prot & lispro) ..... Inject 12 units subcutaneously every morning and 12 units subcutaneously every evening    Adult Aspirin Ec Low Strength 81 Mg  Tbec (Aspirin) ..... One tab by mouth qd    Janumet 50-1000 Mg Tabs (Sitagliptin-metformin hcl) .Marland Kitchen... Take 1 tablet by mouth two times a day  Orders: Glucose, (CBG) [phone])  Labs Reviewed: Creat: 0.30 (05/17/2009)    Reviewed HgBA1c results: 7.9 (04/08/2009)  8.1 (01/14/2009)  Complete Medication List: 1)  Cardizem La 420 Mg Tb24 (Diltiazem hcl coated beads) .... One tab by mouth once daily 2)  Glipizide Xl 10 Mg Tb24 (Glipizide) .... Two tabs by mouth every morning 3)  Lantus 100 Unit/ml Soln (Insulin glargine) .... Inject 100 units subcutaneously once daily 4)  Humalog Mix 75/25 75-25 % Susp (Insulin lispro prot & lispro) .... Inject 12 units subcutaneously every morning and 12 units subcutaneously every evening 5)  Albuterol 90 Mcg/act Aers (Albuterol) .... Two puffs every 6-8 hours as needed 6)  Omeprazole 40 Mg Cpdr (Omeprazole) .... Take one cap by mouth once daily 7)  Proventil Hfa 108 (90 Base) Mcg/act Aers (Albuterol sulfate) .... 2 puffs q 6-8 hrs as needed 8)  Simvastatin 80 Mg Tabs (Simvastatin) .... One tab by mouth qhs 9)  Alprazolam 1 Mg Tabs (Alprazolam) .Marland Kitchen.. 1 twice daily and 2 at night 10)  Ibuprofen 800 Mg Tabs (Ibuprofen) .... One tab by mouth tid 11)  Proventil Hfa 108 (90 Base) Mcg/act Aers (Albuterol sulfate) .... Two puffs every six to eight hours prn 12)  Adult Aspirin Ec Low Strength 81 Mg Tbec (Aspirin) .... One tab by mouth qd 13)  Bd Pen Needle Ultrafine 29g X 12.13mm Misc (Insulin pen needle) .... Uad 14)  Trilipix 135 Mg Cpdr (Choline fenofibrate) .... Take 1 capsule by mouth at bedtime 15)  Phentermine Hcl 37.5 Mg Tabs (Phentermine hcl) .... Take 1 tablet by mouth once a day 16)  Janumet 50-1000 Mg Tabs (Sitagliptin-metformin hcl) .... Take 1 tablet by mouth two times a day 17)  Qvar 40 Mcg/act Aers (Beclomethasone dipropionate) .... 2 puffs twice daily 18)  Phentermine Hcl 37.5 Mg Tabs (Phentermine hcl) .... Take 1 tablet by mouth once a day  Other  Orders: Internal Medicine Referral (Internal)  Patient Instructions: 1)  f/u as before. 2)  you are being referred to Dr Dr Lovell Sheehan to eval the swelling in your left arm. 3)  you are being referred to Dr Nobie Putnam per your request Prescriptions: ALPRAZOLAM 1 MG TABS (ALPRAZOLAM) 1 twice daily and 2 at night  #120 x 3   Entered by:   Everitt Amber LPN   Authorized by:   Syliva Overman MD   Signed by:   Everitt Amber LPN on 81/19/1478   Method used:   Printed then faxed to ...       [facility] (retail)       [address]       [place], [place]  [address]  Ph: [phone]       Fax: [phone]   RxID:   [id number]   Laboratory Results   Blood Tests     Glucose (random): 92 mg/dL   (Normal Range: 13-244)

## 2010-05-20 NOTE — Assessment & Plan Note (Signed)
Summary: OV   Vital Signs:  Patient profile:   55 year old female Menstrual status:  hysterectomy Height:      63 inches Weight:      283 pounds BMI:     50.31 O2 Sat:      98 % Pulse rate:   106 / minute Pulse rhythm:   regular Resp:     16 per minute BP sitting:   122 / 80 Cuff size:   xl  Vitals Entered By: Everitt Amber LPN (June 13, 2009 2:29 PM)  Nutrition Counseling: Patient's BMI is greater than 25 and therefore counseled on weight management options. CC: Follow up chronic problems Is Patient Diabetic? Yes   Primary Care Provider:  Syliva Overman MD  CC:  Follow up chronic problems.  History of Present Illness: pt comesin depressed and tearful stating that she is upset that she has actually gained 1 pound, she is unable to get into [facility] [employer], des not have the deposit and she is distraught that surgery is not being offered o her for weight loss. She still intends to work hard , not giveup and she has actually lost almost 80 pounds sinceshefirst started a weight loss program She denies fever or chills, she does have urinary symptoms.  Allergies: 1)  ! * Victoza  Review of Systems      See HPI General:  Complains of fatigue. Eyes:  Denies blurring and discharge. ENT:  Denies hoarseness, nasal congestion, sinus pressure, and sore throat. CV:  Denies chest pain or discomfort, palpitations, and swelling of feet. Resp:  Denies cough and sputum productive. GI:  Denies abdominal pain, constipation, diarrhea, nausea, and vomiting. GU:  Complains of dysuria and urinary frequency; 1 wek history. MS:  Complains of joint pain. Derm:  Denies itching and rash. Neuro:  Denies headaches. Psych:  Complains of anxiety, depression, easily tearful, and mental problems; denies easily angered, irritability, suicidal thoughts/plans, thoughts of violence, and unusual visions or sounds. Endo:  Denies cold intolerance, excessive hunger, excessive thirst, excessive urination, heat  intolerance, polyuria, and weight change. Heme:  Denies abnormal bruising and bleeding. Allergy:  Denies hives or rash, itching eyes, and sneezing.  Physical Exam  General:  Well-developed,obese,in no acute distress; alert,appropriate and cooperative throughout examination HEENT: No facial asymmetry,  EOMI, No sinus tenderness, TM's Clear, oropharynx  pink and moist.   Chest: Clear to auscultation bilaterally.  CVS: S1, S2, No murmurs, No S3.   Abd: Soft, Nontender.  MS: Adequate ROM spine, hips, shoulders and knees.  Ext: No edema.   CNS: CN 2-12 intact, power tone and sensation normal throughout.   Skin: Intact, no visible lesions or rashes.  Psych: Good eye contact, normal affect.  Memory intact, depressed appearing.    Impression & Recommendations:  Problem # 1:  OTHER ANXIETY STATES (ICD-300.09) Assessment Deteriorated  Her updated medication list for this problem includes:    Alprazolam 1 Mg Tabs (Alprazolam) .Marland Kitchen... 1 twice daily and 2 at night  Problem # 2:  ACUTE CYSTITIS (ICD-595.0) Assessment: Comment Only  Orders: UA Dipstick W/ Micro (manual) (04540)  Encouraged to push clear liquids, get enough rest, and take acetaminophen as needed. To be seen in 10 days if no improvement, sooner if worse.  Problem # 3:  HYPERTENSION (ICD-401.9) Assessment: Unchanged  Her updated medication list for this problem includes:    Cardizem La 420 Mg Tb24 (Diltiazem hcl coated beads) ..... One tab by mouth once daily  BP today: 122/80 Prior  BP: 120/80 (05/17/2009)  Labs Reviewed: K+: 4.0 (05/17/2009) Creat: : 0.30 (05/17/2009)   Chol: 190 (05/17/2009)   HDL: 58 (05/17/2009)   LDL: 120 (05/17/2009)   TG: 59 (05/17/2009)  Problem # 4:  IDDM (ICD-250.01) Assessment: Comment Only  Her updated medication list for this problem includes:    Glipizide Xl 10 Mg Tb24 (Glipizide) .Marland Kitchen..Marland Kitchen Two tabs by mouth every morning    Lantus 100 Unit/ml Soln (Insulin glargine) ..... Inject 100 units  subcutaneously once daily    Humalog Mix 75/25 75-25 % Susp (Insulin lispro prot & lispro) ..... Inject 12 units subcutaneously every morning and 12 units subcutaneously every evening    Adult Aspirin Ec Low Strength 81 Mg Tbec (Aspirin) ..... One tab by mouth qd    Janumet 50-1000 Mg Tabs (Sitagliptin-metformin hcl) .Marland Kitchen... Take 1 tablet by mouth two times a day  Orders: T- Hemoglobin A1C ([id number])  Labs Reviewed: Creat: 0.30 (05/17/2009)    Reviewed HgBA1c results: 7.9 (04/08/2009)  8.1 (01/14/2009)  Complete Medication List: 1)  Cardizem La 420 Mg Tb24 (Diltiazem hcl coated beads) .... One tab by mouth once daily 2)  Glipizide Xl 10 Mg Tb24 (Glipizide) .... Two tabs by mouth every morning 3)  Lantus 100 Unit/ml Soln (Insulin glargine) .... Inject 100 units subcutaneously once daily 4)  Humalog Mix 75/25 75-25 % Susp (Insulin lispro prot & lispro) .... Inject 12 units subcutaneously every morning and 12 units subcutaneously every evening 5)  Albuterol 90 Mcg/act Aers (Albuterol) .... Two puffs every 6-8 hours as needed 6)  Omeprazole 40 Mg Cpdr (Omeprazole) .... Take one cap by mouth once daily 7)  Proventil Hfa 108 (90 Base) Mcg/act Aers (Albuterol sulfate) .... 2 puffs q 6-8 hrs as needed 8)  Simvastatin 80 Mg Tabs (Simvastatin) .... One tab by mouth qhs 9)  Alprazolam 1 Mg Tabs (Alprazolam) .Marland Kitchen.. 1 twice daily and 2 at night 10)  Ibuprofen 800 Mg Tabs (Ibuprofen) .... One tab by mouth tid 11)  Proventil Hfa 108 (90 Base) Mcg/act Aers (Albuterol sulfate) .... Two puffs every six to eight hours prn 12)  Adult Aspirin Ec Low Strength 81 Mg Tbec (Aspirin) .... One tab by mouth qd 13)  Bd Pen Needle Ultrafine 29g X 12.47mm Misc (Insulin pen needle) .... Uad 14)  Trilipix 135 Mg Cpdr (Choline fenofibrate) .... Take 1 capsule by mouth at bedtime 15)  Phentermine Hcl 37.5 Mg Tabs (Phentermine hcl) .... Take 1 tablet by mouth once a day 16)  Janumet 50-1000 Mg Tabs (Sitagliptin-metformin  hcl) .... Take 1 tablet by mouth two times a day 17)  Qvar 40 Mcg/act Aers (Beclomethasone dipropionate) .... 2 puffs twice daily 18)  Phentermine Hcl 37.5 Mg Tabs (Phentermine hcl) .... Take 1 tablet by mouth once a day  Patient Instructions: 1)  Please schedule a follow-up appointment in 1 month. 2)  It is important that you exercise regularly at least 20 minutes 5 times a week. If you develop chest pain, have severe difficulty breathing, or feel very tired , stop exercising immediately and seek medical attention. 3)  You need to lose weight. Consider a lower calorie diet and regular exercise.  4)  HbgA1C prior to visit, ICD-9:  March 20 or after Prescriptions: PHENTERMINE HCL 37.5 MG TABS (PHENTERMINE HCL) Take 1 tablet by mouth once a day  #30 x 0   Entered by:   Everitt Amber LPN   Authorized by:   Syliva Overman MD   Signed by:   Merry Proud  Hudy LPN on 16/01/9603   Method used:   Printed then faxed to ...       [facility] (retail)       [address]       [place], [place]  [address]       Ph: [phone]       Fax: [phone]   RxID:   [id number]   Laboratory Results   Urine Tests    Routine Urinalysis   Color: yellow Appearance: Clear Glucose: negative   (Normal Range: Negative) Bilirubin: negative   (Normal Range: Negative) Ketone: negative   (Normal Range: Negative) Spec. Gravity: 1.020   (Normal Range: 1.003-1.035) Blood: negative   (Normal Range: Negative) pH: 6.5   (Normal Range: 5.0-8.0) Protein: negative   (Normal Range: Negative) Urobilinogen: 0.2   (Normal Range: 0-1) Nitrite: negative   (Normal Range: Negative) Leukocyte Esterace: negative   (Normal Range: Negative)

## 2010-05-20 NOTE — Letter (Signed)
Summary: 2nd Missed Appt.  All     ,     Phone:   Fax:     February 19, 2010  MRN: [id number]  LEKEISHA ARENAS [address] [place], [place]  [address]  Dear Ms. Mickley,  At [facility], we are committed to providing quality health care at the most reasonable cost to you.  It is our policy to require 24 hours' advance notice for appointment cancellations.  If you cancel your appointment with less than 24 hours notice or do not show up for your appointment, the time that has been reserved for you often goes unfilled, depriving other patients who need to be seen and increasing our cost of providing health care due to lost revenues.  We have reviewed your appointment history and found that this is at least the second time you have not showed up for an appointment or canceled at the last minute.  We understand that emergencies occur, and we usually will work with you if that is the case.  However, missed appointments are very disruptive to our office and can have a negative effect on the care we are able to provide our patients.  As per our Welcome letter, further missed appointments or late cancellations could result in discharge from our practice.  Thank you for your consideration.   Luann Bullins      FINAL NOTICE

## 2010-05-20 NOTE — Progress Notes (Signed)
Summary: diabetic shoes  Phone Note Call from Patient   Summary of Call: would like to get a rx for diabetic shoes. [phone] Initial call taken by: Rudene Anda,  December 30, 2009 11:10 AM  Follow-up for Phone Call        script written, pls let her know Follow-up by: Syliva Overman MD,  December 30, 2009 11:45 AM  Additional Follow-up for Phone Call Additional follow up Details #1::        called patient, no answer Additional Follow-up by: Adella Hare LPN,  December 30, 2009 3:29 PM    Additional Follow-up for Phone Call Additional follow up Details #2::    patient aware Follow-up by: Adella Hare LPN,  December 30, 2009 3:31 PM

## 2010-05-20 NOTE — Progress Notes (Signed)
  Phone Note From Pharmacy   Caller: [facility] Summary of Call: janumet not covered(and hasnt been filled since 07/27/09) requires failure of metformin  also request for potassium once daily not on med list Initial call taken by: Adella Hare LPN,  January 03, 2010 2:33 PM  Follow-up for Phone Call        let pt and pharmacy know of med change to metformin pls and fax in the script which i have entered as historical. Pt's HBA1C ia way past due , she needs this also a chem 7 to see if she needs the p[otassium, no indication based on meds taking thT SHE WOULD NEED POTASSIUM SUPPLEMENT, PLS LET HER KNOW AND ORDER THE TESTS also fasting lipid and hepatic she needs these asapo, preferably by Tuesday Follow-up by: Syliva Overman MD,  January 05, 2010 8:31 PM  Additional Follow-up for Phone Call Additional follow up Details #1::        labs ordered, rx sent, called patient, left message Additional Follow-up by: Adella Hare LPN,  January 06, 2010 9:02 AM    Additional Follow-up for Phone Call Additional follow up Details #2::    called patient, no answer Follow-up by: Adella Hare LPN,  January 07, 2010 8:28 AM  New/Updated Medications: METFORMIN HCL 1000 MG TABS (METFORMIN HCL) Take 1 tablet by mouth two times a day METFORMIN HCL 1000 MG TABS (METFORMIN HCL) Take 1 tablet by mouth two times a day discontinue janumet Prescriptions: METFORMIN HCL 1000 MG TABS (METFORMIN HCL) Take 1 tablet by mouth two times a day discontinue janumet  #60 x 3   Entered by:   Adella Hare LPN   Authorized by:   Syliva Overman MD   Signed by:   Adella Hare LPN on 19/14/7829   Method used:   Electronically to        [facility] (retail)       [address]       [place], [place]  [address]       Ph: [phone]       Fax: [phone]   RxID:   [id number] METFORMIN HCL 1000 MG TABS (METFORMIN HCL) Take 1 tablet by mouth two times a day   #60 x 3   Entered and Authorized by:   Syliva Overman MD   Signed by:   Syliva Overman MD on 01/05/2010   Method used:   Historical   RxID:   [id number]

## 2010-05-20 NOTE — Progress Notes (Signed)
Summary: DIET PILL TODAY  Phone Note Call from Patient   Summary of Call: NEEDS HER DIET PILL SENT TO [facility] TODAY SHE JUST TOOK HER LAST ONE Initial call taken by: Lind Guest,  March 05, 2010 3:21 PM  Follow-up for Phone Call        pls refill x 2 and let her know Follow-up by: Syliva Overman MD,  March 05, 2010 5:30 PM  Additional Follow-up for Phone Call Additional follow up Details #1::        Faxed prescription to [facility], could not reach patient on phone. Additional Follow-up by: Mauricia Area CMA,  March 06, 2010 8:16 AM    Prescriptions: PHENTERMINE HCL 37.5 MG TABS (PHENTERMINE HCL) Take 1 tablet by mouth once a day  #30 x 2   Entered by:   Mauricia Area CMA   Authorized by:   Syliva Overman MD   Signed by:   Mauricia Area CMA on 03/06/2010   Method used:   Printed then faxed to ...       [facility]* (retail)       [address]       [place], [place]  [address]       Ph: [phone]       Fax: [phone]   RxID:   [id number]

## 2010-05-20 NOTE — Letter (Signed)
Summary: DIABETIC SUPPLIES  DIABETIC SUPPLIES   Imported By: Lind Guest 08/13/2009 09:18:34  _____________________________________________________________________  External Attachment:    Type:   Image     Comment:   External Document

## 2010-05-20 NOTE — Letter (Signed)
Summary: HANDICAPP CARD  HANDICAPP CARD   Imported By: Lind Guest 11/05/2009 14:38:55  _____________________________________________________________________  External Attachment:    Type:   Image     Comment:   External Document

## 2010-05-20 NOTE — Progress Notes (Signed)
Summary: diet pills  Phone Note Call from Patient   Summary of Call: she in [place] and wants you to send over her diet pills to [facility] she will there a few more mintues so please do asap Initial call taken by: Lind Guest,  October 03, 2009 10:40 AM  Follow-up for Phone Call        should this be refilled? Follow-up by: Adella Hare LPN,  October 03, 2009 1:39 PM  Additional Follow-up for Phone Call Additional follow up Details #1::        REFILL X 1, LET HER KNOW, SEND TO [facility] Additional Follow-up by: Syliva Overman MD,  October 03, 2009 2:54 PM    Additional Follow-up for Phone Call Additional follow up Details #2::    rx sent, called patient, no answer Follow-up by: Adella Hare LPN,  October 03, 2009 4:35 PM  Prescriptions: PHENTERMINE HCL 37.5 MG TABS (PHENTERMINE HCL) Take 1 tablet by mouth once a day  #30 x 0   Entered by:   Adella Hare LPN   Authorized by:   Syliva Overman MD   Signed by:   Adella Hare LPN on 04/54/0981   Method used:   Printed then faxed to ...       [facility] (retail)       [address]       [place], [place]  [address]       Ph: [phone]       Fax: [phone]   RxID:   [id number]

## 2010-05-20 NOTE — Progress Notes (Signed)
Summary: CALL  Phone Note Call from Patient   Summary of Call: Lisa Todd TO CALL HER AT [phone] ASAP Initial call taken by: Lind Guest,  Aug 21, 2009 1:38 PM  Follow-up for Phone Call        pt was actually returning [name] call Follow-up by: Rudene Anda,  Aug 21, 2009 1:43 PM

## 2010-05-20 NOTE — Progress Notes (Signed)
Summary: MEDICINE  Phone Note Call from Patient   Summary of Call: PLEASE CALL IN [facility] Initial call taken by: Lind Guest,  December 13, 2009 9:36 AM  Follow-up for Phone Call        Rx Called In Follow-up by: Adella Hare LPN,  December 13, 2009 3:19 PM    Prescriptions: ALPRAZOLAM 1 MG TABS (ALPRAZOLAM) 1 twice daily and 2 at night  #120 x 0   Entered by:   Adella Hare LPN   Authorized by:   Syliva Overman MD   Signed by:   Adella Hare LPN on 54/12/8117   Method used:   Printed then faxed to ...       [facility] (retail)       [address]       [place], [place]  [address]       Ph: [phone]       Fax: [phone]   RxID:   [id number]

## 2010-05-20 NOTE — Assessment & Plan Note (Signed)
Summary: office visit   Vital Signs:  Patient profile:   55 year old female Menstrual status:  hysterectomy Height:      63 inches Weight:      282 pounds BMI:     50.13 O2 Sat:      97 % Pulse rate:   87 / minute Pulse rhythm:   regular Resp:     16 per minute BP sitting:   120 / 80  (left arm)  Vitals Entered By: Everitt Amber (May 17, 2009 10:08 AM)  Nutrition Counseling: Patient's BMI is greater than 25 and therefore counseled on weight management options. CC: Follow up chronic problems   Primary Care Provider:  Syliva Overman MD  CC:  Follow up chronic problems.  History of Present Illness: Pt comes in stating that she is frustrated about slow rate of weight loss, and still hoping to be approved for surgical intervention. She is now focussing on the fact that her abdomen is large. She exercse on avg 2 to 3 hrs daily now. She reports testing her blood sugars and has hada few hypoglycemic episodes because she is not as diligent in eaing regulalrly as she should be. She reports frustration and exessive crying spells, she is not suicidal or homicidal and denies hallucinations.  Allergies: 1)  ! * Victoza  Review of Systems      See HPI General:  Denies chills and fever. Eyes:  Denies blurring and discharge. ENT:  Denies hoarseness, nasal congestion, sinus pressure, and sore throat. CV:  Denies chest pain or discomfort, palpitations, and swelling of feet. Resp:  Denies cough and sputum productive. GI:  Denies abdominal pain, constipation, diarrhea, nausea, and vomiting. GU:  Denies dysuria and urinary frequency. MS:  Complains of joint pain. Psych:  Complains of anxiety, depression, easily angered, easily tearful, irritability, and mental problems; denies suicidal thoughts/plans, thoughts of violence, and unusual visions or sounds. Endo:  Denies cold intolerance, excessive hunger, excessive thirst, excessive urination, heat intolerance, polyuria, and weight change;  checks 2 to 3 times per day, drinks 2 shakes and 1 meal daily, reports hypoglycemia to 57 appox 3 weeks ago, states she forgot to eat. Heme:  Denies abnormal bruising and bleeding.  Physical Exam  General:  Well-developed,obese,in no acute distress; alert,appropriate and cooperative throughout examination HEENT: No facial asymmetry,  EOMI, No sinus tenderness, TM's Clear, oropharynx  pink and moist.   Chest: Clear to auscultation bilaterally. decreased air entry throughout. CVS: S1, S2, No murmurs, No S3.   Abd: Soft, Nontender.  MS: decreased  ROM spine, hips, shoulders and knees.  Ext: No edema.   CNS: CN 2-12 intact, power tone and sensation normal throughout.   Skin: Intact, no visible lesions or rashes.  Psych: Good eye contact, normal affect.  Memory intact,  depressed appearing.    Impression & Recommendations:  Problem # 1:  OTHER ANXIETY STATES (ICD-300.09) Assessment Deteriorated  Her updated medication list for this problem includes:    Alprazolam 1 Mg Tabs (Alprazolam) .Marland Kitchen... 1 twice daily and 2 at night  Problem # 2:  GYNECOMASTIA (ICD-611.1) Assessment: Comment Only pt states she has been told that if she gets down to 250 pounds the surgeon will operate  Problem # 3:  HYPERLIPIDEMIA (ICD-272.4) Assessment: Comment Only  Her updated medication list for this problem includes:    Simvastatin 80 Mg Tabs (Simvastatin) ..... One tab by mouth qhs    Trilipix 135 Mg Cpdr (Choline fenofibrate) .Marland Kitchen... Take 1 capsule by mouth  at bedtime  Orders: T-Hepatic Function [id number]) T-Lipid Profile [id number])  Labs Reviewed: SGOT: 12 (02/06/2009)   SGPT: 13 (02/06/2009)   HDL:60 (02/06/2009), 64 (06/15/2008)  LDL:130 (02/06/2009), 141 (06/15/2008)  Chol:208 (02/06/2009), 227 (06/15/2008)  Trig:90 (02/06/2009), 108 (06/15/2008)  Problem # 4:  IDDM (ICD-250.01) Assessment: Comment Only  Her updated medication list for this problem includes:    Glipizide Xl 10 Mg Tb24  (Glipizide) .Marland Kitchen..Marland Kitchen Two tabs by mouth every morning    Lantus 100 Unit/ml Soln (Insulin glargine) ..... Inject 100 units subcutaneously once daily    Humalog Mix 75/25 75-25 % Susp (Insulin lispro prot & lispro) ..... Inject 12 units subcutaneously every morning and 12 units subcutaneously every evening    Adult Aspirin Ec Low Strength 81 Mg Tbec (Aspirin) ..... One tab by mouth qd    Janumet 50-1000 Mg Tabs (Sitagliptin-metformin hcl) .Marland Kitchen... Take 1 tablet by mouth two times a day  Labs Reviewed: Creat: 0.80 (02/06/2009)    Reviewed HgBA1c results: 7.9 (04/08/2009)  8.1 (01/14/2009)  Problem # 5:  ASTHMA (ICD-493.90) Assessment: Improved  Her updated medication list for this problem includes:    Albuterol 90 Mcg/act Aers (Albuterol) .Marland Kitchen..Marland Kitchen Two puffs every 6-8 hours as needed    Proventil Hfa 108 (90 Base) Mcg/act Aers (Albuterol sulfate) .Marland Kitchen... 2 puffs q 6-8 hrs as needed    Proventil Hfa 108 (90 Base) Mcg/act Aers (Albuterol sulfate) .Marland Kitchen..Marland Kitchen Two puffs every six to eight hours prn    Qvar 40 Mcg/act Aers (Beclomethasone dipropionate) .Marland Kitchen... 2 puffs twice daily  Problem # 6:  OBESITY, UNSPECIFIED (ICD-278.00) Assessment: Improved  Ht: 63 (05/17/2009)   Wt: 282 (05/17/2009)   BMI: 50.13 (05/17/2009)  Complete Medication List: 1)  Cardizem La 420 Mg Tb24 (Diltiazem hcl coated beads) .... One tab by mouth once daily 2)  Glipizide Xl 10 Mg Tb24 (Glipizide) .... Two tabs by mouth every morning 3)  Lantus 100 Unit/ml Soln (Insulin glargine) .... Inject 100 units subcutaneously once daily 4)  Humalog Mix 75/25 75-25 % Susp (Insulin lispro prot & lispro) .... Inject 12 units subcutaneously every morning and 12 units subcutaneously every evening 5)  Albuterol 90 Mcg/act Aers (Albuterol) .... Two puffs every 6-8 hours as needed 6)  Omeprazole 40 Mg Cpdr (Omeprazole) .... Take one cap by mouth once daily 7)  Proventil Hfa 108 (90 Base) Mcg/act Aers (Albuterol sulfate) .... 2 puffs q 6-8 hrs as needed 8)   Simvastatin 80 Mg Tabs (Simvastatin) .... One tab by mouth qhs 9)  Alprazolam 1 Mg Tabs (Alprazolam) .Marland Kitchen.. 1 twice daily and 2 at night 10)  Ibuprofen 800 Mg Tabs (Ibuprofen) .... One tab by mouth tid 11)  Proventil Hfa 108 (90 Base) Mcg/act Aers (Albuterol sulfate) .... Two puffs every six to eight hours prn 12)  Adult Aspirin Ec Low Strength 81 Mg Tbec (Aspirin) .... One tab by mouth qd 13)  Bd Pen Needle Ultrafine 29g X 12.74mm Misc (Insulin pen needle) .... Uad 14)  Trilipix 135 Mg Cpdr (Choline fenofibrate) .... Take 1 capsule by mouth at bedtime 15)  Phentermine Hcl 37.5 Mg Tabs (Phentermine hcl) .... Take 1 tablet by mouth once a day 16)  Janumet 50-1000 Mg Tabs (Sitagliptin-metformin hcl) .... Take 1 tablet by mouth two times a day 17)  Qvar 40 Mcg/act Aers (Beclomethasone dipropionate) .... 2 puffs twice daily 18)  Phentermine Hcl 37.5 Mg Tabs (Phentermine hcl) .... Take 1 tablet by mouth once a day  Other Orders: T-Basic Metabolic Panel [id number])  Patient Instructions: 1)  Please schedule a follow-up appointment in 1 month. 2)  It is important that you exercise regularly at least 20 minutes 5 times a week. If you develop chest pain, have severe difficulty breathing, or feel very tired , stop exercising immediately and seek medical attention. 3)  You need to lose weight. Consider a lower calorie diet and regular exercise.  4)  cONGRATS on continued weight lossand exercise plan, pls keep itup 5)  BMP prior to visit, ICD-9: 6)  Hepatic Panel prior to visit, ICD-9:   fasting today 7)  Lipid Panel prior to visit, ICD-9: Prescriptions: PHENTERMINE HCL 37.5 MG TABS (PHENTERMINE HCL) Take 1 tablet by mouth once a day  #30 x 0   Entered and Authorized by:   Syliva Overman MD   Signed by:   Syliva Overman MD on 05/17/2009   Method used:   Printed then faxed to ...       [facility] (retail)       [address]       [place], [place]   [address]       Ph: [phone]       Fax: [phone]   RxID:   [id number]  Abdominal girth- 51''

## 2010-05-20 NOTE — Assessment & Plan Note (Signed)
Summary: f up from hospital   Vital Signs:  Patient profile:   55 year old female Menstrual status:  hysterectomy Height:      63 inches Weight:      268 pounds BMI:     47.65 O2 Sat:      98 % Pulse rate:   75 / minute Pulse rhythm:   regular Resp:     16 per minute BP sitting:   110 / 68  (left arm) Cuff size:   large  Vitals Entered By: Everitt Amber LPN (December 04, 2009 3:29 PM)  Nutrition Counseling: Patient's BMI is greater than 25 and therefore counseled on weight management options. CC: Follow up from surgery   Primary Care Provider:  Syliva Overman MD  CC:  Follow up from surgery.  History of Present Illness: Pt had right shoulder surgery , the surgery itself went well, however her blood sugar got  extremely low when she was "accidentally " given 100 units, states she is being reconsidered for weight loss surgery, and is interested in this. she has again started regular exercise, and remains diligent in her diet. she denies depression, does have anxiety , and reports a "new attitude" She denies any current symptoms of uncontrolled blood sugars and does not use insulin on a regular basis. She denies head or chest congestion, she denies dysuria, frequency or incontinence.  Current Medications (verified): 1)  Cardizem La 420 Mg  Tb24 (Diltiazem Hcl Coated Beads) .... One Tab By Mouth Once Daily 2)  Glipizide Xl 10 Mg  Tb24 (Glipizide) .... Two Tabs By Mouth Every Morning 3)  Lantus 100 Unit/ml  Soln (Insulin Glargine) .... Inject 100 Units Subcutaneously Once Daily 4)  Humalog Mix 75/25 75-25 %  Susp (Insulin Lispro Prot & Lispro) .... Inject 12 Units Subcutaneously Every Morning and 12 Units Subcutaneously Every Evening 5)  Albuterol 90 Mcg/act  Aers (Albuterol) .... Two Puffs Every 6-8 Hours As Needed 6)  Omeprazole 40 Mg Cpdr (Omeprazole) .... Take One Cap By Mouth Once Daily 7)  Proventil Hfa 108 (90 Base) Mcg/act Aers (Albuterol Sulfate) .... 2 Puffs Q 6-8 Hrs As  Needed 8)  Alprazolam 1 Mg Tabs (Alprazolam) .Marland Kitchen.. 1 Twice Daily and 2 At Night 9)  Ibuprofen 800 Mg Tabs (Ibuprofen) .... One Tab By Mouth Tid 10)  Proventil Hfa 108 (90 Base) Mcg/act Aers (Albuterol Sulfate) .... Two Puffs Every Six To Eight Hours Prn 11)  Adult Aspirin Ec Low Strength 81 Mg Tbec (Aspirin) .... One Tab By Mouth Qd 12)  Bd Pen Needle Ultrafine 29g X 12.12mm Misc (Insulin Pen Needle) .... Uad 13)  Trilipix 135 Mg Cpdr (Choline Fenofibrate) .... Take 1 Capsule By Mouth At Bedtime 14)  Phentermine Hcl 37.5 Mg Tabs (Phentermine Hcl) .... Take 1 Tablet By Mouth Once A Day 15)  Janumet 50-1000 Mg Tabs (Sitagliptin-Metformin Hcl) .... Take 1 Tablet By Mouth Two Times A Day 16)  Qvar 40 Mcg/act Aers (Beclomethasone Dipropionate) .... 2 Puffs Twice Daily 17)  Phentermine Hcl 37.5 Mg Tabs (Phentermine Hcl) .... Take 1 Tablet By Mouth Once A Day 18)  Vitamin D (Ergocalciferol) 50000 Unit Caps (Ergocalciferol) .... One Cap By Mouth Every Week 19)  Cyclobenzaprine Hcl 10 Mg Tabs (Cyclobenzaprine Hcl) .... Take 1 Tab By Mouth At Bedtime As Needed 20)  Pravachol 40 Mg Tabs (Pravastatin Sodium) .... One Tablet At Bedtime To Start7/21/2011  Allergies (verified): 1)  ! * Victoza  Past History:  Past medical, surgical, family and  social histories (including risk factors) reviewed, and no changes noted (except as noted below).  Past Medical History: Reviewed history from 07/15/2007 and no changes required. Current Problems:  BACK PAIN, CHRONIC (ICD-724.5) HYPERLIPIDEMIA (ICD-272.4) ASTHMA (ICD-493.90) SLEEP APNEA (ICD-780.57) DEPRESSION (ICD-311) OBESITY, UNSPECIFIED (ICD-278.00) HYPERTENSION (ICD-401.9) IDDM (ICD-250.01)  Past Surgical History: Hysterectomy (2002) Left knee arthroscopy (2004) Right knee surgery (2007) Caesarean section (1989) Right rotator cuff surgery-11/26/2009  Family History: Reviewed history from 07/15/2007 and no changes required. Mother living - HTN, DM,  breast cancer Father living - HTN, asthma, DM One brother deceased - 44 mnths old Two brothers living - x1 DM and HTN Four sisters living - x1 HTN, x1 DM  Social History: Reviewed history from 07/15/2007 and no changes required. Unemployed Divorced Three children Never Smoked Alcohol use-no History of illicit drug use-yes  Review of Systems      See HPI General:  Complains of fatigue. Eyes:  Denies blurring and discharge. CV:  Denies chest pain or discomfort, difficulty breathing while lying down, and palpitations. Resp:  Denies cough, sputum productive, and wheezing. GI:  Denies abdominal pain, constipation, diarrhea, nausea, and vomiting. MS:  Complains of joint pain and stiffness. Derm:  Denies itching, lesion(s), and rash. Psych:  Complains of anxiety and depression; denies mental problems, suicidal thoughts/plans, thoughts of violence, and unusual visions or sounds. Endo:  Denies excessive hunger and excessive thirst. Heme:  Denies abnormal bruising and bleeding. Allergy:  Complains of seasonal allergies; denies hives or rash and itching eyes.  Physical Exam  General:  Well-developed,obese,in no acute distress; alert,appropriate and cooperative throughout examination HEENT: No facial asymmetry,  EOMI, No sinus tenderness, TM's Clear, oropharynx  pink and moist.   Chest: Clear to auscultation bilaterally.  CVS: S1, S2, No murmurs, No S3.   Abd: Soft, Nontender.  MS: Adequate ROM spine, hips,  and knees. Right shoulder in dressing and a sling Ext: No edema.   CNS: CN 2-12 intact, power tone and sensation normal throughout.   Skin: Intact, n Psych: Good eye contact, normal affect.  Memory intact, depressed appearing.    Impression & Recommendations:  Problem # 1:  OTHER ANXIETY STATES (ICD-300.09) Assessment Improved  Her updated medication list for this problem includes:    Alprazolam 1 Mg Tabs (Alprazolam) .Marland Kitchen... 1 twice daily and 2 at night  Problem # 2:   HYPERTENSION (ICD-401.9) Assessment: Unchanged  Her updated medication list for this problem includes:    Cardizem La 420 Mg Tb24 (Diltiazem hcl coated beads) ..... One tab by mouth once daily, needs to be on an ACE will disuss at next  ov  BP today: 110/68 Prior BP: 110/72 (10/23/2009)  Labs Reviewed: K+: 4.1 (08/20/2009) Creat: : 0.84 (08/20/2009)   Chol: 190 (05/17/2009)   HDL: 58 (05/17/2009)   LDL: 120 (05/17/2009)   TG: 59 (05/17/2009)  Problem # 3:  HYPERLIPIDEMIA (ICD-272.4) Assessment: Comment Only  Her updated medication list for this problem includes:    Trilipix 135 Mg Cpdr (Choline fenofibrate) .Marland Kitchen... Take 1 capsule by mouth at bedtime    Pravachol 40 Mg Tabs (Pravastatin sodium) ..... One tablet at bedtime to start7/21/2011  Orders: T-Hepatic Function [id number]) T-Lipid Profile [id number])  Labs Reviewed: SGOT: 12 (08/20/2009)   SGPT: 12 (08/20/2009)   HDL:58 (05/17/2009), 60 (02/06/2009)  LDL:120 (05/17/2009), 130 (02/06/2009)  Chol:190 (05/17/2009), 208 (02/06/2009)  Trig:59 (05/17/2009), 90 (02/06/2009)  Problem # 4:  IDDM (ICD-250.01) Assessment: Improved  The following medications were removed from the medication list:  Lantus 100 Unit/ml Soln (Insulin glargine) ..... Inject 100 units subcutaneously once daily    Humalog Mix 75/25 75-25 % Susp (Insulin lispro prot & lispro) ..... Inject 12 units subcutaneously every morning and 12 units subcutaneously every evening Her updated medication list for this problem includes:    Glipizide Xl 10 Mg Tb24 (Glipizide) .Marland Kitchen..Marland Kitchen Two tabs by mouth every morning    Adult Aspirin Ec Low Strength 81 Mg Tbec (Aspirin) ..... One tab by mouth qd    Janumet 50-1000 Mg Tabs (Sitagliptin-metformin hcl) .Marland Kitchen... Take 1 tablet by mouth two times a day  Orders: T- Hemoglobin A1C ([id number])  Labs Reviewed: Creat: 0.84 (08/20/2009)    Reviewed HgBA1c results: 6.6 (08/20/2009)  7.9 (04/08/2009)  Problem # 5:  OBESITY,  UNSPECIFIED (ICD-278.00) Assessment: Improved  Ht: 63 (12/04/2009)   Wt: 268 (12/04/2009)   BMI: 47.65 (12/04/2009)  Complete Medication List: 1)  Cardizem La 420 Mg Tb24 (Diltiazem hcl coated beads) .... One tab by mouth once daily 2)  Glipizide Xl 10 Mg Tb24 (Glipizide) .... Two tabs by mouth every morning 3)  Albuterol 90 Mcg/act Aers (Albuterol) .... Two puffs every 6-8 hours as needed 4)  Omeprazole 40 Mg Cpdr (Omeprazole) .... Take one cap by mouth once daily 5)  Proventil Hfa 108 (90 Base) Mcg/act Aers (Albuterol sulfate) .... 2 puffs q 6-8 hrs as needed 6)  Alprazolam 1 Mg Tabs (Alprazolam) .Marland Kitchen.. 1 twice daily and 2 at night 7)  Ibuprofen 800 Mg Tabs (Ibuprofen) .... One tab by mouth tid 8)  Proventil Hfa 108 (90 Base) Mcg/act Aers (Albuterol sulfate) .... Two puffs every six to eight hours prn 9)  Adult Aspirin Ec Low Strength 81 Mg Tbec (Aspirin) .... One tab by mouth qd 10)  Bd Pen Needle Ultrafine 29g X 12.84mm Misc (Insulin pen needle) .... Uad 11)  Trilipix 135 Mg Cpdr (Choline fenofibrate) .... Take 1 capsule by mouth at bedtime 12)  Phentermine Hcl 37.5 Mg Tabs (Phentermine hcl) .... Take 1 tablet by mouth once a day 13)  Janumet 50-1000 Mg Tabs (Sitagliptin-metformin hcl) .... Take 1 tablet by mouth two times a day 14)  Qvar 40 Mcg/act Aers (Beclomethasone dipropionate) .... 2 puffs twice daily 15)  Phentermine Hcl 37.5 Mg Tabs (Phentermine hcl) .... Take 1 tablet by mouth once a day 16)  Vitamin D (ergocalciferol) 50000 Unit Caps (Ergocalciferol) .... One cap by mouth every week 17)  Cyclobenzaprine Hcl 10 Mg Tabs (Cyclobenzaprine hcl) .... Take 1 tab by mouth at bedtime as needed 18)  Pravachol 40 Mg Tabs (Pravastatin sodium) .... One tablet at bedtime to start7/21/2011  Other Orders: T-Basic Metabolic Panel [id number]) T-Vitamin D (25-Hydroxy) [id number])  Patient Instructions: 1)  Please schedule a follow-up appointment in 1 month. 2)  It is important that you  exercise regularly at least 20 minutes 5 times a week. If you develop chest pain, have severe difficulty breathing, or feel very tired , stop exercising immediately and seek medical attention. 3)  You need to lose weight. Consider a lower calorie diet and regular exercise.  4)  BMP prior to visit, ICD-9: 5)  Hepatic Panel prior to visit, ICD-9: 6)  Lipid Panel prior to visit, ICD-9: 7)  HbgA1C prior to visit, ICD-9 8)  Vitamin D: Prescriptions: ALPRAZOLAM 1 MG TABS (ALPRAZOLAM) 1 twice daily and 2 at night  #120 x 0   Entered by:   Everitt Amber LPN   Authorized by:   Syliva Overman MD   Signed by:  Brandi Hudy LPN on 47/82/9562   Method used:   Printed then faxed to ...       [facility] (retail)       [address]       [place], [place]  [address]       Ph: [phone]       Fax: [phone]   RxID:   [id number]

## 2010-05-20 NOTE — Letter (Signed)
Summary: HISTORY AND PHYSICAL  HISTORY AND PHYSICAL   Imported By: Lind Guest 09/17/2009 13:57:09  _____________________________________________________________________  External Attachment:    Type:   Image     Comment:   External Document

## 2010-05-20 NOTE — Progress Notes (Signed)
Summary: [facility]  Phone Note Call from Patient   Summary of Call: [facility] MEDICAL LEFT MESSAGE ON HER SUPPLIES REF # [id number] # [id number] Initial call taken by: Lind Guest,  February 05, 2010 9:12 AM  Follow-up for Phone Call        we do not reply to these messages unless patient contacts office requesting this Follow-up by: Adella Hare LPN,  February 05, 2010 9:15 AM

## 2010-05-20 NOTE — Letter (Signed)
Summary: 2nd Missed Appt.  All     ,     Phone:   Fax:     June 13, 2009  MRN: [id number]  MORINE KOHLMAN [address] [place], [place]  [address]  Dear Ms. Dement,  At [facility], we are committed to providing quality health care at the most reasonable cost to you.  It is our policy to require 24 hours' advance notice for appointment cancellations.  If you cancel your appointment with less than 24 hours notice or do not show up for your appointment, the time that has been reserved for you often goes unfilled, depriving other patients who need to be seen and increasing our cost of providing health care due to lost revenues.  We have reviewed your appointment history and found that this is at least the second time you have not showed up for an appointment or canceled at the last minute.  We understand that emergencies occur, and we usually will work with you if that is the case.  However, missed appointments are very disruptive to our office and can have a negative effect on the care we are able to provide our patients.    Thank you for your consideration.   Altamease Oiler      FINAL NOTICE

## 2010-05-20 NOTE — Letter (Signed)
Summary: CONSULTS  CONSULTS   Imported By: Lind Guest 09/17/2009 13:59:48  _____________________________________________________________________  External Attachment:    Type:   Image     Comment:   External Document

## 2010-05-20 NOTE — Letter (Signed)
Summary: DEMO  DEMO   Imported By: Lind Guest 09/17/2009 13:56:32  _____________________________________________________________________  External Attachment:    Type:   Image     Comment:   External Document

## 2010-05-20 NOTE — Letter (Signed)
Summary: 1st Missed Appt.  [facility]  [address]   [place], [place] [address]   Phone: [phone]  Fax: [phone]    May 20, 2009  MRN: [id number]  MELVA FAUX [address] [place], [place]  [address]  Dear Ms. Wyline Mood,  At [facility], we make every attempt to fit patients into our schedule by reserving several appointment slots for same-day appointments.  However, we cannot always make appointments for patients the same day they are calling.  At the end of the day, we look back at our schedule and find that because of last-minute cancellations and patients not showing up for their scheduled appointments, we have several appointment slots that are left open and could have been used by another person who really needed it.  In the past, you may have been one of the patients who could not get in when you needed to.  But recently, you were one of the patients with an appointment that you didn't show up for or canceled too late for [place] to fill it.  We choose not to charge no-show or last minute cancellation fees to our patients, like many other offices do.  We do not wish to institute that policy and hope we never have to.  However, we kindly request that you assist [place] by providing at least 24 hours' notice if you can't make your appointment.  If no-shows or late cancellations become habitual (i.e. Three or more in a one-year period), we may terminate the physician-patient relationship.    Thank you for your consideration and cooperation.   Altamease Oiler

## 2010-05-20 NOTE — Progress Notes (Signed)
Summary: STRAP THROAT  Phone Note Call from Patient   Summary of Call: WANTING TO COME IN NO APPT AND THINKS SHE HAS STRAP THROAT CALL BACK AT [phone] Initial call taken by: Lind Guest,  April 23, 2009 10:20 AM  Follow-up for Phone Call        pls call and get more details on symptoms, may offer nurse visit for rapid strep test, check if any contact with strep pos people Follow-up by: Syliva Overman MD,  April 23, 2009 12:47 PM  Additional Follow-up for Phone Call Additional follow up Details #1::        called pt no answer Additional Follow-up by: Everitt Amber,  April 23, 2009 12:53 PM    Additional Follow-up for Phone Call Additional follow up Details #2::    said throat is really sore and some swelling. Thinks its turning into strep. No fever or body aches. Doesn't want to come today for NV because she has app tomorrow. Advised warm salt water gargles until tomorrow Follow-up by: Everitt Amber,  April 23, 2009 3:20 PM

## 2010-05-20 NOTE — Assessment & Plan Note (Signed)
Summary: OV   Vital Signs:  Patient profile:   55 year old female Menstrual status:  hysterectomy Height:      63 inches Weight:      273 pounds BMI:     48.53 O2 Sat:      98 % Pulse rate:   106 / minute Pulse rhythm:   regular Resp:     16 per minute BP sitting:   108 / 70  (left arm) Cuff size:   xl  Vitals Entered By: Everitt Amber LPN (Aug 21, 1658 3:18 PM)  Nutrition Counseling: Patient's BMI is greater than 25 and therefore counseled on weight management options. CC: Follow up chronic problems, having to urinate more often than usual. Submitting urine for ccua   Primary Care Provider:  Syliva Overman MD  CC:  Follow up chronic problems and having to urinate more often than usual. Submitting urine for ccua.  History of Present Illness: Pt states she is walking 45 mins twice daily, she is not happy wiith her current weight loss asusual, although she has actually lost 9 pounds in the last 6 weeks.  c/o not enough money, school is not going well.  She also states that it is [facility] too much to [facility], cannot afford it  She c/o uncontrolled pain, tried to see local doc , but was told he is closing down, her ortho in [place] is already prescribing meds for her knees and injecting the knees, but will not inc the meds and will notcontinue to give shots often   Allergies: 1)  ! * Victoza  Review of Systems      See HPI General:  Complains of fatigue and malaise; denies chills and fever. Eyes:  Denies discharge, eye pain, and red eye. ENT:  Denies hoarseness, nasal congestion, and sinus pressure. CV:  Denies chest pain or discomfort, palpitations, and swelling of feet. Resp:  Denies cough, sputum productive, and wheezing. GI:  Denies abdominal pain, constipation, diarrhea, nausea, and vomiting. GU:  Complains of discharge; denies dysuria and urinary frequency; requests sTD blood tests due to possible exposure, condom burst. MS:  Complains of joint pain, low  back pain, mid back pain, and stiffness; c/o uncontrolled back and knee pain, wants to find a new provider for pain mx. Derm:  Denies itching, lesion(s), and rash. Neuro:  Denies headaches, poor balance, seizures, and sensation of room spinning. Psych:  Complains of anxiety, depression, easily angered, irritability, and mental problems; denies panic attacks, sense of great danger, suicidal thoughts/plans, thoughts of violence, and unusual visions or sounds. Endo:  Denies cold intolerance, excessive hunger, excessive thirst, excessive urination, heat intolerance, polyuria, and weight change; pt has not been testing for the past month , states she is fed up about everything. Heme:  Denies abnormal bruising and bleeding. Allergy:  Complains of seasonal allergies.  Physical Exam  General:  Well-developed,obese,in no acute distress; alert,appropriate and cooperative throughout examination HEENT: No facial asymmetry,  EOMI, No sinus tenderness, TM's Clear, oropharynx  pink and moist.   Chest: Clear to auscultation bilaterally.  CVS: S1, S2, No murmurs, No S3.   Abd: Soft, Nontender.  MS: Adequate ROM spine, hips, shoulders and knees.  Ext: No edema.   CNS: CN 2-12 intact, power tone and sensation normal throughout.   Skin: Intact, n Psych: Good eye contact, normal affect.  Memory intact, depressed appearing.    Impression & Recommendations:  Problem # 1:  SCREENING EXAMINATION FOR VENEREAL DISEASE (ICD-V74.5)  Assessment Comment Only  Orders: T-HIV Antibody  (Reflex) [id number]) T-Syphilis Test (RPR) [id number]) impt of condom use stressed  Problem # 2:  OTHER ANXIETY STATES (ICD-300.09) Assessment: Deteriorated  Her updated medication list for this problem includes:    Alprazolam 1 Mg Tabs (Alprazolam) .Marland Kitchen... 1 twice daily and 2 at night  Problem # 3:  KNEE PAIN, BILATERAL (ICD-719.46) Assessment: Deteriorated  Her updated medication list for this problem includes:     Ibuprofen 800 Mg Tabs (Ibuprofen) ..... One tab by mouth tid    Adult Aspirin Ec Low Strength 81 Mg Tbec (Aspirin) ..... One tab by mouth qd  Problem # 4:  BACK PAIN, CHRONIC (ICD-724.5) Assessment: Deteriorated  Her updated medication list for this problem includes:    Ibuprofen 800 Mg Tabs (Ibuprofen) ..... One tab by mouth tid    Adult Aspirin Ec Low Strength 81 Mg Tbec (Aspirin) ..... One tab by mouth qd toradol 60mg  IM administered  Orders: Ketorolac-Toradol 15mg  (Z3086) Admin of Therapeutic Inj  intramuscular or subcutaneous (57846)  Problem # 5:  DEPRESSION (ICD-311) Assessment: Deteriorated  Her updated medication list for this problem includes:    Alprazolam 1 Mg Tabs (Alprazolam) .Marland Kitchen... 1 twice daily and 2 at night  Problem # 6:  HYPERTENSION (ICD-401.9) Assessment: Unchanged  Her updated medication list for this problem includes:    Cardizem La 420 Mg Tb24 (Diltiazem hcl coated beads) ..... One tab by mouth once daily  BP today: 108/70 Prior BP: 122/76 (07/04/2009)  Labs Reviewed: K+: 4.0 (05/17/2009) Creat: : 0.30 (05/17/2009)   Chol: 190 (05/17/2009)   HDL: 58 (05/17/2009)   LDL: 120 (05/17/2009)   TG: 59 (05/17/2009)  Problem # 7:  OBESITY, UNSPECIFIED (ICD-278.00) Assessment: Improved  Ht: 63 (08/20/2009)   Wt: 273 (08/20/2009)   BMI: 48.53 (08/20/2009)  Complete Medication List: 1)  Cardizem La 420 Mg Tb24 (Diltiazem hcl coated beads) .... One tab by mouth once daily 2)  Glipizide Xl 10 Mg Tb24 (Glipizide) .... Two tabs by mouth every morning 3)  Lantus 100 Unit/ml Soln (Insulin glargine) .... Inject 100 units subcutaneously once daily 4)  Humalog Mix 75/25 75-25 % Susp (Insulin lispro prot & lispro) .... Inject 12 units subcutaneously every morning and 12 units subcutaneously every evening 5)  Albuterol 90 Mcg/act Aers (Albuterol) .... Two puffs every 6-8 hours as needed 6)  Omeprazole 40 Mg Cpdr (Omeprazole) .... Take one cap by mouth once daily 7)   Proventil Hfa 108 (90 Base) Mcg/act Aers (Albuterol sulfate) .... 2 puffs q 6-8 hrs as needed 8)  Simvastatin 80 Mg Tabs (Simvastatin) .... One tab by mouth qhs 9)  Alprazolam 1 Mg Tabs (Alprazolam) .Marland Kitchen.. 1 twice daily and 2 at night 10)  Ibuprofen 800 Mg Tabs (Ibuprofen) .... One tab by mouth tid 11)  Proventil Hfa 108 (90 Base) Mcg/act Aers (Albuterol sulfate) .... Two puffs every six to eight hours prn 12)  Adult Aspirin Ec Low Strength 81 Mg Tbec (Aspirin) .... One tab by mouth qd 13)  Bd Pen Needle Ultrafine 29g X 12.49mm Misc (Insulin pen needle) .... Uad 14)  Trilipix 135 Mg Cpdr (Choline fenofibrate) .... Take 1 capsule by mouth at bedtime 15)  Phentermine Hcl 37.5 Mg Tabs (Phentermine hcl) .... Take 1 tablet by mouth once a day 16)  Janumet 50-1000 Mg Tabs (Sitagliptin-metformin hcl) .... Take 1 tablet by mouth two times a day 17)  Qvar 40 Mcg/act Aers (Beclomethasone dipropionate) .... 2 puffs twice daily 18)  Phentermine  Hcl 37.5 Mg Tabs (Phentermine hcl) .... Take 1 tablet by mouth once a day  Other Orders: T-Basic Metabolic Panel [id number]) T-Hepatic Function [id number]) T- Hemoglobin A1C [phone]) T-Vitamin D (25-Hydroxy) [phone]) T-Urine Microalbumin w/creat. ratio [id number]) UA Dipstick W/ Micro (manual) (24580)  Patient Instructions: 1)  Please schedule a follow-up appointment in 1 month. 2)  It is important that you exercise regularly at least 20 minutes 5 times a week. If you develop chest pain, have severe difficulty breathing, or feel very tired , stop exercising immediately and seek medical attention. 3)  You need to lose weight. Consider a lower calorie diet and regular exercise. Congrats on your 9 pound weight loss, pls keep it up 4)  Check your blood sugars regularly. If your readings are usually above : or below 70 you should contact our office. 5)  BMP prior to visit, ICD-9: 6)  HbgA1C prior to visit, ICD-9: 7)  Urine Microalbumin prior to  visit, ICD-9: 8)  Hepatic Panel prior to visit, ICD-9:   today 9)  Vitamin d 10)  RPR and HIV testing Prescriptions: PHENTERMINE HCL 37.5 MG TABS (PHENTERMINE HCL) Take 1 tablet by mouth once a day  #30 x 0   Entered by:   Everitt Amber LPN   Authorized by:   Syliva Overman MD   Signed by:   Everitt Amber LPN on 99/83/3825   Method used:   Printed then faxed to ...       [facility]* (retail)       [address]       [place], [place]  [address]       Ph: [phone]       Fax: [phone]   RxID:   [id number]   Laboratory Results   Urine Tests    Routine Urinalysis   Color: yellow Appearance: Clear Glucose: negative   (Normal Range: Negative) Bilirubin: negative   (Normal Range: Negative) Ketone: negative   (Normal Range: Negative) Spec. Gravity: 1.025   (Normal Range: 1.003-1.035) Blood: negative   (Normal Range: Negative) pH: 5.5   (Normal Range: 5.0-8.0) Protein: negative   (Normal Range: Negative) Urobilinogen: 0.2   (Normal Range: 0-1) Nitrite: negative   (Normal Range: Negative) Leukocyte Esterace: negative   (Normal Range: Negative)         Medication Administration  Injection # 1:    Medication: Ketorolac-Toradol 15mg     Diagnosis: BACK PAIN, CHRONIC (ICD-724.5)    Route: IM    Site: LUOQ gluteus    Exp Date: 03/2011    Lot #: [id number]     Mfr: [email or web address]    Comments: 60mg  given     Patient tolerated injection without complications    Given by: Everitt Amber LPN (Aug 21, 9890 4:11 PM)  Orders Added: 1)  Est. Patient Level IV [11941] 2)  T-Basic Metabolic Panel [[id number]] 3)  T-Hepatic Function [80076-22960] 4)  T- Hemoglobin A1C [83036-23375] 5)  T-Vitamin D (25-Hydroxy) [56314-97026] 6)  T-HIV Antibody  (Reflex) [[id number]] 7)  T-Syphilis Test (RPR) [74128-78676] 8)  T-Urine Microalbumin w/creat. ratio [[id number]] 9)  UA Dipstick W/ Micro (manual) [81000] 10)  Ketorolac-Toradol 15mg   [J1885] 11)  Admin of Therapeutic Inj  intramuscular or subcutaneous [72094]

## 2010-05-20 NOTE — Progress Notes (Signed)
Summary: MURPHY/WAINER  MURPHY/WAINER   Imported By: Lind Guest 11/04/2009 14:50:58  _____________________________________________________________________  External Attachment:    Type:   Image     Comment:   External Document

## 2010-05-20 NOTE — Progress Notes (Signed)
Summary: refill  Phone Note Call from Patient   Summary of Call: pt needs diet pill called into pharm. [phone]  to [facility] Initial call taken by: Rudene Anda,  January 29, 2010 10:32 AM  Follow-up for Phone Call        can patient have refill on phentermine? Follow-up by: Everitt Amber LPN,  January 29, 2010 10:48 AM  Additional Follow-up for Phone Call Additional follow up Details #1::        If diue pls refill x2 I am unclear whyshe always has to be calling in Additional Follow-up by: Syliva Overman MD,  January 29, 2010 11:52 AM    Additional Follow-up for Phone Call Additional follow up Details #2::    refilled Follow-up by: Everitt Amber LPN,  January 29, 2010 1:14 PM  Prescriptions: PHENTERMINE HCL 37.5 MG TABS (PHENTERMINE HCL) Take 1 tablet by mouth once a day  #30 x 1   Entered by:   Everitt Amber LPN   Authorized by:   Syliva Overman MD   Signed by:   Everitt Amber LPN on 43/15/4008   Method used:   Printed then faxed to ...       [facility] (retail)       [address]       [place], [place]  [address]       Ph: [phone]       Fax: [phone]   RxID:   [id number]

## 2010-05-20 NOTE — Letter (Signed)
Summary: LABS  LABS   Imported By: Lind Guest 09/17/2009 13:58:07  _____________________________________________________________________  External Attachment:    Type:   Image     Comment:   External Document

## 2010-05-20 NOTE — Assessment & Plan Note (Signed)
Summary: office visit   Vital Signs:  Patient profile:   55 year old female Menstrual status:  hysterectomy Height:      63 inches Weight:      265 pounds Pulse rate:   82 / minute Pulse rhythm:   regular BP sitting:   110 / 72  (left arm)  Primary Care Provider:  Syliva Overman MD   History of Present Illness: Reports  that she has not been doing well. She is still frustrated that she was unable to have weight reduction surgery. She bstates now shge has no car she is not w  Denies recent fever or chillsexercising as she should, she is however continuingf to lose weight.Which is great. Denies sinus pressure, nasal congestion , ear pain or sore throat. Denies chest congestion, or cough productive of sputum. Denies chest pain, palpitations, PND, orthopnea or leg swelling. Denies abdominal pain, nausea, vomitting, diarrhea or constipation. Denies change in bowel movements or bloody stool. Denies  incontinence or hesitancy.  Denies headaches, vertigo, seizures.  Denies  rash, lesions, or itch.      Allergies: 1)  ! * Victoza  Review of Systems      See HPI General:  Complains of fatigue. Eyes:  Denies discharge and red eye. GU:  Complains of dysuria and urinary frequency; 5 day h/o. MS:  Complains of joint pain, low back pain, mid back pain, and stiffness; right shoulder injury, may need surgery. Psych:  Complains of anxiety, depression, and mental problems; denies sense of great danger, suicidal thoughts/plans, thoughts of violence, and unusual visions or sounds; less stressed. Endo:  Denies cold intolerance, excessive hunger, excessive thirst, excessive urination, heat intolerance, polyuria, and weight change; SUGARS, WHEN TESTED ARE SELDOM OVER 140. Heme:  Denies abnormal bruising and bleeding. Allergy:  Complains of seasonal allergies; denies hives or rash and itching eyes.  Physical Exam  General:  Well-developed,obese,in no acute distress; alert,appropriate and  cooperative throughout examination HEENT: No facial asymmetry,  EOMI, No sinus tenderness, TM's Clear, oropharynx  pink and moist.   Chest: Clear to auscultation bilaterally.  CVS: S1, S2, No murmurs, No S3.   Abd: Soft, Nontender.  MS: Adequate ROM spine, hips, shoulders and knees.  Ext: No edema.   CNS: CN 2-12 intact, power tone and sensation normal throughout.   Skin: Intact, n Psych: Good eye contact, normal affect.  Memory intact, depressed appearing.    Impression & Recommendations:  Problem # 1:  ACUTE CYSTITIS (ICD-595.0) Assessment Comment Only  Orders: Urinalysis (81003-65000)PT REASSURED THAT NO LAB EVIDENCE OF uti  Encouraged to push clear liquids, get enough rest, and take acetaminophen as needed. To be seen in 10 days if no improvement, sooner if worse.  Problem # 2:  OBESITY, UNSPECIFIED (ICD-278.00) Assessment: Improved  Ht: 63 (10/23/2009)   Wt: 265 (10/23/2009)   BMI: 48.53 (08/20/2009)  Problem # 3:  IDDM (ICD-250.01) Assessment: Improved  Her updated medication list for this problem includes:    Glipizide Xl 10 Mg Tb24 (Glipizide) .Marland Kitchen..Marland Kitchen Two tabs by mouth every morning    Lantus 100 Unit/ml Soln (Insulin glargine) ..... Inject 100 units subcutaneously once daily    Humalog Mix 75/25 75-25 % Susp (Insulin lispro prot & lispro) ..... Inject 12 units subcutaneously every morning and 12 units subcutaneously every evening    Adult Aspirin Ec Low Strength 81 Mg Tbec (Aspirin) ..... One tab by mouth qd    Janumet 50-1000 Mg Tabs (Sitagliptin-metformin hcl) .Marland Kitchen... Take 1 tablet by mouth  two times a day  Labs Reviewed: Creat: 0.84 (08/20/2009)    Reviewed HgBA1c results: 6.6 (08/20/2009)  7.9 (04/08/2009)  Problem # 4:  HYPERTENSION (ICD-401.9) Assessment: Unchanged  Her updated medication list for this problem includes:    Cardizem La 420 Mg Tb24 (Diltiazem hcl coated beads) ..... One tab by mouth once daily  Future Orders: Ophthalmology Referral  (Ophthalmology) ... 11/20/2009  BP today: 110/72 Prior BP: 108/70 (08/20/2009)  Labs Reviewed: K+: 4.1 (08/20/2009) Creat: : 0.84 (08/20/2009)   Chol: 190 (05/17/2009)   HDL: 58 (05/17/2009)   LDL: 120 (05/17/2009)   TG: 59 (05/17/2009)  Problem # 5:  OTHER ANXIETY STATES (ICD-300.09) Assessment: Improved  Her updated medication list for this problem includes:    Alprazolam 1 Mg Tabs (Alprazolam) .Marland Kitchen... 1 twice daily and 2 at night  Complete Medication List: 1)  Cardizem La 420 Mg Tb24 (Diltiazem hcl coated beads) .... One tab by mouth once daily 2)  Glipizide Xl 10 Mg Tb24 (Glipizide) .... Two tabs by mouth every morning 3)  Lantus 100 Unit/ml Soln (Insulin glargine) .... Inject 100 units subcutaneously once daily 4)  Humalog Mix 75/25 75-25 % Susp (Insulin lispro prot & lispro) .... Inject 12 units subcutaneously every morning and 12 units subcutaneously every evening 5)  Albuterol 90 Mcg/act Aers (Albuterol) .... Two puffs every 6-8 hours as needed 6)  Omeprazole 40 Mg Cpdr (Omeprazole) .... Take one cap by mouth once daily 7)  Proventil Hfa 108 (90 Base) Mcg/act Aers (Albuterol sulfate) .... 2 puffs q 6-8 hrs as needed 8)  Simvastatin 80 Mg Tabs (Simvastatin) .... One tab by mouth qhs 9)  Alprazolam 1 Mg Tabs (Alprazolam) .Marland Kitchen.. 1 twice daily and 2 at night 10)  Ibuprofen 800 Mg Tabs (Ibuprofen) .... One tab by mouth tid 11)  Proventil Hfa 108 (90 Base) Mcg/act Aers (Albuterol sulfate) .... Two puffs every six to eight hours prn 12)  Adult Aspirin Ec Low Strength 81 Mg Tbec (Aspirin) .... One tab by mouth qd 13)  Bd Pen Needle Ultrafine 29g X 12.85mm Misc (Insulin pen needle) .... Uad 14)  Trilipix 135 Mg Cpdr (Choline fenofibrate) .... Take 1 capsule by mouth at bedtime 15)  Phentermine Hcl 37.5 Mg Tabs (Phentermine hcl) .... Take 1 tablet by mouth once a day 16)  Janumet 50-1000 Mg Tabs (Sitagliptin-metformin hcl) .... Take 1 tablet by mouth two times a day 17)  Qvar 40 Mcg/act Aers  (Beclomethasone dipropionate) .... 2 puffs twice daily 18)  Phentermine Hcl 37.5 Mg Tabs (Phentermine hcl) .... Take 1 tablet by mouth once a day 19)  Vitamin D (ergocalciferol) 50000 Unit Caps (Ergocalciferol) .... One cap by mouth every week  Patient Instructions: 1)  cPE in 1 month 2)  It is important that you exercise regularly at least 20 minutes 5 times a week. If you develop chest pain, have severe difficulty breathing, or feel very tired , stop exercising immediately and seek medical attention. 3)  You need to lose weight. Consider a lower calorie diet and regular exercise. congrats on weight lost , keep it up. 4)  you will be referred for an eye exam, we will call you with the appt    Laboratory Results   Urine Tests  Date/Time Received: October 23, 2009 4:48 PM  Date/Time Reported: October 23, 2009 4:48 PM   Routine Urinalysis   Color: yellow Appearance: Clear Glucose: negative   (Normal Range: Negative) Bilirubin: negative   (Normal Range: Negative) Ketone:  negative   (Normal Range: Negative) Spec. Gravity: 1.025   (Normal Range: 1.003-1.035) Blood: negative   (Normal Range: Negative) pH: 6.0   (Normal Range: 5.0-8.0) Protein: negative   (Normal Range: Negative) Urobilinogen: 0.2   (Normal Range: 0-1) Nitrite: negative   (Normal Range: Negative) Leukocyte Esterace: negative   (Normal Range: Negative)

## 2010-05-20 NOTE — Letter (Signed)
Summary: OFFICE NOTES  OFFICE NOTES   Imported By: Lind Guest 09/17/2009 13:54:50  _____________________________________________________________________  External Attachment:    Type:   Image     Comment:   External Document

## 2010-05-20 NOTE — Progress Notes (Signed)
Summary: MURPHY / Delbert Harness / Thurston Hole   Imported By: Lind Guest 12/06/2009 13:50:00  _____________________________________________________________________  External Attachment:    Type:   Image     Comment:   External Document

## 2010-05-20 NOTE — Letter (Signed)
Summary: Letter  Letter   Imported By: Lind Guest 11/07/2009 14:15:27  _____________________________________________________________________  External Attachment:    Type:   Image     Comment:   External Document

## 2010-05-20 NOTE — Progress Notes (Signed)
Summary: green drainage  Phone Note Call from Patient   Summary of Call: pt has alot of green mucus coming up and would like to get something called in.  [facility] [phone] Initial call taken by: Rudene Anda,  September 18, 2009 10:37 AM  Follow-up for Phone Call        please have patient schedule appt Follow-up by: Adella Hare LPN,  September 18, 6293 2:35 PM  Additional Follow-up for Phone Call Additional follow up Details #1::        COMING 6.2.11  Additional Follow-up by: Lind Guest,  September 18, 2009 2:43 PM

## 2010-05-20 NOTE — Assessment & Plan Note (Signed)
Summary: office visit   Vital Signs:  Patient profile:   55 year old female Menstrual status:  hysterectomy Height:      63 inches Weight:      259 pounds BMI:     46.05 O2 Sat:      98 % Pulse rate:   87 / minute Pulse rhythm:   regular Resp:     16 per minute BP sitting:   122 / 74  (left arm) Cuff size:   large  Vitals Entered By: Everitt Amber LPN  Nutrition Counseling: Patient's BMI is greater than 25 and therefore counseled on weight management options. CC: Follow up chronic problems   Primary Care Daris Aristizabal:  Syliva Overman MD  CC:  Follow up chronic problems.  History of Present Illness: Reports  that tshe has been doing fairly well. She has actually lost 9 pounds in the past 4 weeks, which is extremely good. She still is wishing that she can get surgery. Denies recent fever or chills. Denies sinus pressure, nasal congestion , ear pain or sore throat. Denies chest congestion, or cough productive of sputum. Denies chest pain, palpitations, PND, orthopnea or leg swelling. Denies abdominal pain, nausea, vomitting, diarrhea or constipation. Denies change in bowel movements or bloody stool. Denies dysuria , frequency, incontinence or hesitancy. Reports   joint pain, and  reduced mobility, espescially affecting the right shpoulder which she recently had surgery on. Denies headaches, vertigo, seizures. She reports improvement in depression and anxiety. Denies  rash, lesions, or itch.     Allergies: 1)  ! * Victoza  Review of Systems      See HPI General:  Complains of fatigue and sleep disorder. Eyes:  Denies blurring and discharge. Endo:  Denies excessive thirst and excessive urination; reports hypoglycemia, with her sugars inthe 60's at times. Heme:  Denies abnormal bruising and bleeding. Allergy:  Denies hives or rash and itching eyes.  Physical Exam  General:  Well-developed,obese,in no acute distress; alert,appropriate and cooperative throughout  examination HEENT: No facial asymmetry,  EOMI, No sinus tenderness, TM's Clear, oropharynx  pink and moist.   Chest: Clear to auscultation bilaterally.  CVS: S1, S2, No murmurs, No S3.   Abd: Soft, Nontender.  MS: Adequate ROM spine, hips,  and knees. Right shoulderno longer  in  a sling reduced ROM Ext: No edema.   CNS: CN 2-12 intact, power tone and sensation normal throughout.   Skin: Intact, n Psych: Good eye contact, normal affect.  Memory intact, depressed appearing.    Impression & Recommendations:  Problem # 1:  HYPERTENSION (ICD-401.9) Assessment Unchanged  Her updated medication list for this problem includes:    Cardizem La 420 Mg Tb24 (Diltiazem hcl coated beads) ..... One tab by mouth once daily  BP today: 122/74 Prior BP: 110/68 (12/04/2009)  Labs Reviewed: K+: 4.1 (08/20/2009) Creat: : 0.84 (08/20/2009)   Chol: 190 (05/17/2009)   HDL: 58 (05/17/2009)   LDL: 120 (05/17/2009)   TG: 59 (05/17/2009)  Problem # 2:  IDDM (ICD-250.01) Assessment: Improved  The following medications were removed from the medication list:    Glipizide Xl 10 Mg Tb24 (Glipizide) .Marland Kitchen..Marland Kitchen Two tabs by mouth every morning Her updated medication list for this problem includes:    Adult Aspirin Ec Low Strength 81 Mg Tbec (Aspirin) ..... One tab by mouth qd    Metformin Hcl 1000 Mg Tabs (Metformin hcl) .Marland Kitchen... Take 1 tablet by mouth two times a day discontinue janumet    Glipizide 10  Mg Xr24h-tab (Glipizide) .Marland Kitchen... Take 1 tablet by mouth once a day  Orders: Glucose, (CBG) [phone])  Labs Reviewed: Creat: 0.84 (08/20/2009)    Reviewed HgBA1c results: 6.6 (08/20/2009)  7.9 (04/08/2009)  Problem # 3:  OBESITY, UNSPECIFIED (ICD-278.00) Assessment: Improved  Ht: 63 (01/08/2010)   Wt: 259 (01/08/2010)   BMI: 46.05 (01/08/2010) therapeutic lifestyle change discussed and encouraged ]  Problem # 4:  HYPERLIPIDEMIA (ICD-272.4) Assessment: Comment Only  Her updated medication list for this problem  includes:    Trilipix 135 Mg Cpdr (Choline fenofibrate) .Marland Kitchen... Take 1 capsule by mouth at bedtime    Pravachol 40 Mg Tabs (Pravastatin sodium) ..... One tablet at bedtime to start7/21/2011 Low fat dietdiscussed and encouraged  Labs Reviewed: SGOT: 12 (08/20/2009)   SGPT: 12 (08/20/2009)   HDL:58 (05/17/2009), 60 (02/06/2009)  LDL:120 (05/17/2009), 130 (02/06/2009)  Chol:190 (05/17/2009), 208 (02/06/2009)  Trig:59 (05/17/2009), 90 (02/06/2009)  Complete Medication List: 1)  Cardizem La 420 Mg Tb24 (Diltiazem hcl coated beads) .... One tab by mouth once daily 2)  Albuterol 90 Mcg/act Aers (Albuterol) .... Two puffs every 6-8 hours as needed 3)  Omeprazole 40 Mg Cpdr (Omeprazole) .... Take one cap by mouth once daily 4)  Proventil Hfa 108 (90 Base) Mcg/act Aers (Albuterol sulfate) .... 2 puffs q 6-8 hrs as needed 5)  Alprazolam 1 Mg Tabs (Alprazolam) .Marland Kitchen.. 1 twice daily and 2 at night 6)  Ibuprofen 800 Mg Tabs (Ibuprofen) .... One tab by mouth tid 7)  Proventil Hfa 108 (90 Base) Mcg/act Aers (Albuterol sulfate) .... Two puffs every six to eight hours prn 8)  Adult Aspirin Ec Low Strength 81 Mg Tbec (Aspirin) .... One tab by mouth qd 9)  Bd Pen Needle Ultrafine 29g X 12.93mm Misc (Insulin pen needle) .... Uad 10)  Trilipix 135 Mg Cpdr (Choline fenofibrate) .... Take 1 capsule by mouth at bedtime 11)  Phentermine Hcl 37.5 Mg Tabs (Phentermine hcl) .... Take 1 tablet by mouth once a day 12)  Qvar 40 Mcg/act Aers (Beclomethasone dipropionate) .... 2 puffs twice daily 13)  Phentermine Hcl 37.5 Mg Tabs (Phentermine hcl) .... Take 1 tablet by mouth once a day 14)  Vitamin D (ergocalciferol) 50000 Unit Caps (Ergocalciferol) .... One cap by mouth every week 15)  Cyclobenzaprine Hcl 10 Mg Tabs (Cyclobenzaprine hcl) .... Take 1 tab by mouth at bedtime as needed 16)  Pravachol 40 Mg Tabs (Pravastatin sodium) .... One tablet at bedtime to start7/21/2011 17)  Metformin Hcl 1000 Mg Tabs (Metformin hcl) ....  Take 1 tablet by mouth two times a day discontinue janumet 18)  Glipizide 10 Mg Xr24h-tab (Glipizide) .... Take 1 tablet by mouth once a day 19)  Clotrimazole-betamethasone 1-0.05 % Crea (Clotrimazole-betamethasone) .... Apply twice dailly for 10 days, then as needed  Other Orders: T-TSH [phone]) T-Vitamin D (25-Hydroxy) [phone])  Patient Instructions: 1)  Please schedule a follow-up appointment in 1 month. 2)  BMP prior to visit, ICD-9: 3)  Hepatic Panel prior to visit, ICD-9:  fasting in am 4)  Lipid Panel prior to visit, ICD-9: 5)  HbgA1C prior to visit, ICD-9: 6)  vitamin D 7)  Congrats on weight loss 8)  cUT back the glipizide to oNE daily pls 9)  med is sent in for rash under the breast Prescriptions: CLOTRIMAZOLE-BETAMETHASONE 1-0.05 % CREA (CLOTRIMAZOLE-BETAMETHASONE) apply twice dailly for 10 days, then as needed  #45 gm x 1   Entered and Authorized by:   Syliva Overman MD   Signed by:  Syliva Overman MD on 01/08/2010   Method used:   Printed then faxed to ...       [facility]* (retail)       [address]       [place], [place]  [address]       Ph: [phone]       Fax: [phone]   RxID:   [id number] GLIPIZIDE 10 MG XR24H-TAB (GLIPIZIDE) Take 1 tablet by mouth once a day  #30 x 3   Entered and Authorized by:   Syliva Overman MD   Signed by:   Syliva Overman MD on 01/08/2010   Method used:   Printed then faxed to ...       [facility] (retail)       [address]       [place], [place]  [address]       Ph: [phone]       Fax: [phone]   RxID:   [id number]   Laboratory Results   Blood Tests     Glucose (random): 135 mg/dL   (Normal Range: 29-476)

## 2010-05-20 NOTE — Letter (Signed)
Summary: CONSULTS  CONSULTS   Imported By: Lind Guest 09/17/2009 13:56:05  _____________________________________________________________________  External Attachment:    Type:   Image     Comment:   External Document

## 2010-05-20 NOTE — Progress Notes (Signed)
Summary: diet pill  Phone Note Call from Patient   Summary of Call: please call  in diet pills to [facility]. [phone]  Initial call taken by: Rudene Anda,  December 25, 2009 9:56 AM  Follow-up for Phone Call        Rx Called In Follow-up by: Adella Hare LPN,  December 25, 2009 3:52 PM    Prescriptions: PHENTERMINE HCL 37.5 MG TABS (PHENTERMINE HCL) Take 1 tablet by mouth once a day  #30 x 1   Entered by:   Adella Hare LPN   Authorized by:   Syliva Overman MD   Signed by:   Adella Hare LPN on 09/81/1914   Method used:   Printed then faxed to ...       [facility] (retail)       [address]       [place], [place]  [address]       Ph: [phone]       Fax: [phone]   RxID:   [id number]  pls refill x 2

## 2010-05-22 NOTE — Progress Notes (Signed)
Summary: NEW METER AND STRIPS  Phone Note Call from Patient   Summary of Call: NEEDS FOR YOU TO SEND IN FOR HER A NEW ACCU CK METER AND STRIPS SHE CHECKS IT 4 X A DAY SEND TO [facility]  Initial call taken by: Lind Guest,  May 15, 2010 3:34 PM  Follow-up for Phone Call        Rx Called In Follow-up by: Adella Hare LPN,  May 15, 2010 3:53 PM    New/Updated Medications: ACCU-CHEK AVIVA  KIT (BLOOD GLUCOSE MONITORING SUPPL) four times a day testing dx: 250.00 ACCU-CHEK AVIVA  STRP (GLUCOSE BLOOD) four times daily testing dx:250.00 ACCU-CHEK MULTICLIX LANCET DEV  KIT (LANCETS MISC.) four times a day testing dx: 250.00 ACCU-CHEK MULTICLIX LANCETS  MISC (LANCETS) four times daily testing  dx: 250.00    Prescriptions: ACCU-CHEK MULTICLIX LANCETS  MISC (LANCETS) four times daily testing  dx: 250.00  #120 x 3   Entered by:   Adella Hare LPN   Authorized by:   Syliva Overman MD   Signed by:   Adella Hare LPN on 09/81/1914   Method used:   Electronically to        [facility] (retail)       [address] [place], [place]  [address]       Ph: [phone]       Fax: [phone]   RxID:   [id number] ACCU-CHEK MULTICLIX LANCET DEV  KIT (LANCETS MISC.) four times a day testing dx: 250.00  #1 x 0   Entered by:   Adella Hare LPN   Authorized by:   Syliva Overman MD   Signed by:   Adella Hare LPN on 40/34/7425   Method used:   Electronically to        [facility] (retail)       [address] [place], [place]  [address]       Ph: [phone]       Fax: [phone]   RxID:   [id number] ACCU-CHEK AVIVA  KIT (BLOOD GLUCOSE MONITORING SUPPL) four times a day testing dx: 250.00  #120 x 3   Entered by:   Adella Hare LPN   Authorized by:   Syliva Overman MD   Signed by:   Adella Hare LPN on 25/42/7062   Method used:   Electronically to        [facility]  (retail)       [address] [place], [place]  [address]       Ph: [phone]       Fax: [phone]   RxID:   [id number]

## 2010-05-22 NOTE — Miscellaneous (Signed)
  Clinical Lists Changes  Medications: Removed medication of GLIPIZIDE 10 MG XR24H-TAB (GLIPIZIDE) Take 1 tablet by mouth once a day Added new medication of GLIPIZIDE 10 MG XR24H-TAB (GLIPIZIDE) Take 1 tablet by mouth two times a day - Signed Removed medication of PRAVACHOL 40 MG TABS (PRAVASTATIN SODIUM) one tablet at bedtime to start7/21/2011 Added new medication of PRAVACHOL 40 MG TABS (PRAVASTATIN SODIUM) two tablets at bedtime - Signed Rx of GLIPIZIDE 10 MG XR24H-TAB (GLIPIZIDE) Take 1 tablet by mouth two times a day;  #60 x 4;  Signed;  Entered by: Syliva Overman MD;  Authorized by: Syliva Overman MD;  Method used: Historical Rx of PRAVACHOL 40 MG TABS (PRAVASTATIN SODIUM) two tablets at bedtime;  #60 x 3;  Signed;  Entered by: Syliva Overman MD;  Authorized by: Syliva Overman MD;  Method used: Historical    Prescriptions: PRAVACHOL 40 MG TABS (PRAVASTATIN SODIUM) two tablets at bedtime  #60 x 3   Entered and Authorized by:   Syliva Overman MD   Signed by:   Syliva Overman MD on 04/06/2010   Method used:   Historical   RxID:   [id number] GLIPIZIDE 10 MG XR24H-TAB (GLIPIZIDE) Take 1 tablet by mouth two times a day  #60 x 4   Entered and Authorized by:   Syliva Overman MD   Signed by:   Syliva Overman MD on 04/06/2010   Method used:   Historical   RxID:   [id number]

## 2010-05-22 NOTE — Medication Information (Signed)
Summary: Tax adviser   Imported By: Lind Guest 05/13/2010 10:49:11  _____________________________________________________________________  External Attachment:    Type:   Image     Comment:   External Document

## 2010-05-22 NOTE — Letter (Signed)
Summary: Letter  Letter   Imported By: Lind Guest 04/02/2010 13:51:03  _____________________________________________________________________  External Attachment:    Type:   Image     Comment:   External Document

## 2010-05-22 NOTE — Assessment & Plan Note (Signed)
Summary: OV   Vital Signs:  Patient profile:   55 year old female Menstrual status:  hysterectomy Height:      63 inches Weight:      254.75 pounds BMI:     45.29 O2 Sat:      98 % on Room air Pulse rate:   87 / minute Pulse rhythm:   regular Resp:     16 per minute BP sitting:   126 / 80  (left arm)  Vitals Entered By: Adella Hare LPN (March 26, 2010 2:01 PM)  Nutrition Counseling: Patient's BMI is greater than 25 and therefore counseled on weight management options.  O2 Flow:  Room air CC: follow-up visit Is Patient Diabetic? Yes Did you bring your meter with you today? No Comments did not bring meds to ov, states med list is correct   Primary Care Provider:  Syliva Overman MD  CC:  follow-up visit.  History of Present Illness: Reports  that she has not been doing very well. She is diturbed because of slow weight loss. She continues to have alot of psychosocial stress, with little income and multiple family concerns. Denies recent fever or chills. Denies sinus pressure, nasal congestion , ear pain or sore throat. Denies chest congestion, or cough productive of sputum. Denies chest pain, palpitations, PND, orthopnea or leg swelling. Denies abdominal pain, nausea, vomitting, diarrhea or constipation. Denies change in bowel movements or bloody stool. Denies dysuria , frequency, incontinence or hesitancy.  Occasional headaches,denies ertigo, seizures.  Denies  rash, lesions, or itch.     Allergies (verified): 1)  ! * Victoza  Review of Systems      See HPI General:  Complains of fatigue. Eyes:  Denies discharge and red eye. MS:  Complains of joint pain, low back pain, mid back pain, and stiffness. Derm:  Denies rash. Psych:  Complains of anxiety, depression, and mental problems; denies panic attacks, suicidal thoughts/plans, thoughts of violence, and unusual visions or sounds. Endo:  Complains of excessive thirst; denies excessive urination. Heme:   Denies abnormal bruising and bleeding. Allergy:  Denies hives or rash and itching eyes.  Physical Exam  General:  Well-developed,obese,in no acute distress; alert,appropriate and cooperative throughout examination HEENT: No facial asymmetry,  EOMI, No sinus tenderness, TM's Clear, oropharynx  pink and moist.   Chest: Clear to auscultation bilaterally.  CVS: S1, S2, No murmurs, No S3.   Abd: Soft, Nontender.  MS: decreased ROM spine, adequate in hips, shoulders and knees Ext: No edema.   CNS: CN 2-12 intact, power and  tone normal throughout.   Skin: Intact, n Psych: Good eye contact, normal affect.  Memory intact, neither anxious nor depressed appearing   Diabetes Management Exam:    Foot Exam (with socks and/or shoes not present):       Sensory-Monofilament:          Left foot: diminished          Right foot: diminished       Inspection:          Left foot: normal          Right foot: normal       Nails:          Left foot: thickened          Right foot: thickened   Impression & Recommendations:  Problem # 1:  OTHER ANXIETY STATES (ICD-300.09) Assessment Unchanged  Her updated medication list for this problem includes:    Alprazolam  1 Mg Tabs (Alprazolam) .Marland Kitchen... 1 twice daily and 2 at night  Problem # 2:  DEPRESSION (ICD-311) Assessment: Unchanged  Her updated medication list for this problem includes:    Alprazolam 1 Mg Tabs (Alprazolam) .Marland Kitchen... 1 twice daily and 2 at night  Problem # 3:  BACK PAIN, CHRONIC (ICD-724.5) Assessment: Unchanged  Her updated medication list for this problem includes:    Ibuprofen 800 Mg Tabs (Ibuprofen) ..... One tab by mouth tid    Adult Aspirin Ec Low Strength 81 Mg Tbec (Aspirin) ..... One tab by mouth qd    Cyclobenzaprine Hcl 10 Mg Tabs (Cyclobenzaprine hcl) .Marland Kitchen... Take 1 tab by mouth at bedtime as needed  Problem # 4:  HYPERLIPIDEMIA (ICD-272.4) Assessment: Comment Only  Her updated medication list for this problem includes:     Trilipix 135 Mg Cpdr (Choline fenofibrate) .Marland Kitchen... Take 1 capsule by mouth at bedtime    Pravachol 40 Mg Tabs (Pravastatin sodium) ..... One tablet at bedtime to start7/21/2011 Low fat dietdiscussed and encouraged  Orders: T-Lipid Profile [phone]) T-Hepatic Function [phone])  Labs Reviewed: SGOT: 12 (08/20/2009)   SGPT: 12 (08/20/2009)   HDL:58 (05/17/2009), 60 (02/06/2009)  LDL:120 (05/17/2009), 130 (02/06/2009)  Chol:190 (05/17/2009), 208 (02/06/2009)  Trig:59 (05/17/2009), 90 (02/06/2009)  Problem # 5:  ASTHMA (ICD-493.90) Assessment: Improved  The following medications were removed from the medication list:    Albuterol 90 Mcg/act Aers (Albuterol) .Marland Kitchen..Marland Kitchen Two puffs every 6-8 hours as needed    Proventil Hfa 108 (90 Base) Mcg/act Aers (Albuterol sulfate) .Marland Kitchen... 2 puffs q 6-8 hrs as needed Her updated medication list for this problem includes:    Proventil Hfa 108 (90 Base) Mcg/act Aers (Albuterol sulfate) .Marland Kitchen..Marland Kitchen Two puffs every six to eight hours prn    Qvar 40 Mcg/act Aers (Beclomethasone dipropionate) .Marland Kitchen... 2 puffs twice daily  Problem # 6:  HYPERTENSION (ICD-401.9) Assessment: Unchanged  Her updated medication list for this problem includes:    Cardizem La 420 Mg Tb24 (Diltiazem hcl coated beads) ..... One tab by mouth once daily  Orders: T-Basic Metabolic Panel ([phone])  BP today: 126/80 Prior BP: 122/74 (01/08/2010)  Labs Reviewed: K+: 4.1 (08/20/2009) Creat: : 0.84 (08/20/2009)   Chol: 190 (05/17/2009)   HDL: 58 (05/17/2009)   LDL: 120 (05/17/2009)   TG: 59 (05/17/2009)  Problem # 7:  IDDM (ICD-250.01) Assessment: Comment Only  Her updated medication list for this problem includes:    Adult Aspirin Ec Low Strength 81 Mg Tbec (Aspirin) ..... One tab by mouth qd    Metformin Hcl 1000 Mg Tabs (Metformin hcl) .Marland Kitchen... Take 1 tablet by mouth two times a day discontinue janumet    Glipizide 10 Mg Xr24h-tab (Glipizide) .Marland Kitchen... Take 1 tablet by mouth once a  day  Orders: T- Hemoglobin A1C ([id number])  Labs Reviewed: Creat: 0.84 (08/20/2009)    Reviewed HgBA1c results: 6.6 (08/20/2009)  7.9 (04/08/2009)  Complete Medication List: 1)  Cardizem La 420 Mg Tb24 (Diltiazem hcl coated beads) .... One tab by mouth once daily 2)  Omeprazole 40 Mg Cpdr (Omeprazole) .... Take one cap by mouth once daily 3)  Alprazolam 1 Mg Tabs (Alprazolam) .Marland Kitchen.. 1 twice daily and 2 at night 4)  Ibuprofen 800 Mg Tabs (Ibuprofen) .... One tab by mouth tid 5)  Proventil Hfa 108 (90 Base) Mcg/act Aers (Albuterol sulfate) .... Two puffs every six to eight hours prn 6)  Adult Aspirin Ec Low Strength 81 Mg Tbec (Aspirin) .... One tab by mouth qd 7)  Bd  Pen Needle Ultrafine 29g X 12.31mm Misc (Insulin pen needle) .... Uad 8)  Trilipix 135 Mg Cpdr (Choline fenofibrate) .... Take 1 capsule by mouth at bedtime 9)  Qvar 40 Mcg/act Aers (Beclomethasone dipropionate) .... 2 puffs twice daily 10)  Vitamin D (ergocalciferol) 50000 Unit Caps (Ergocalciferol) .... One cap by mouth every week 11)  Cyclobenzaprine Hcl 10 Mg Tabs (Cyclobenzaprine hcl) .... Take 1 tab by mouth at bedtime as needed 12)  Pravachol 40 Mg Tabs (Pravastatin sodium) .... One tablet at bedtime to start7/21/2011 13)  Metformin Hcl 1000 Mg Tabs (Metformin hcl) .... Take 1 tablet by mouth two times a day discontinue janumet 14)  Glipizide 10 Mg Xr24h-tab (Glipizide) .... Take 1 tablet by mouth once a day 15)  Clotrimazole-betamethasone 1-0.05 % Crea (Clotrimazole-betamethasone) .... Apply twice dailly for 10 days, then as needed 16)  Phentermine Hcl 37.5 Mg Tabs (Phentermine hcl) .... Take 1 tablet by mouth once a day 17)  Clotrimazole 1 % Crea (Clotrimazole) .... Apply twice daily to soles of feet x 4 weeks, then as needed  Other Orders: T-HIV Antibody  (Reflex) ([id number]) T-Syphilis Test (RPR) [phone]) T-Vitamin D (25-Hydroxy) [phone]) Urinalysis ([id number])  Patient Instructions: 1)  Please  schedule a CPE 2nd week in January 2)  It is important that you exercise regularly at least 30 minutes 5 times a week. If you develop chest pain, have severe difficulty breathing, or feel very tired , stop exercising immediately and seek medical attention. 3)  You need to lose weight. Consider a lower calorie diet and regular exercise. 4)  BMP prior to visit, ICD-9: 5)  Hepatic Panel prior to visit, ICD-9: 6)  Lipid Panel prior to visit, ICD-9:  fasting asap , past due 7)  HbgA1C prior to visit, ICD-9: 8)  Vit D, RPR and HIV Prescriptions: CLOTRIMAZOLE 1 % CREA (CLOTRIMAZOLE) apply twice daily to soles of feet x 4 weeks, then as needed  #45 gm x 2   Entered and Authorized by:   Syliva Overman MD   Signed by:   Syliva Overman MD on 03/26/2010   Method used:   Electronically to        [facility] (retail)       [address]       [place], [place]  [address]       Ph: [phone]       Fax: [phone]   RxID:   [id number] PHENTERMINE HCL 37.5 MG TABS (PHENTERMINE HCL) Take 1 tablet by mouth once a day  #30 x 1   Entered and Authorized by:   Syliva Overman MD   Signed by:   Syliva Overman MD on 03/26/2010   Method used:   Printed then faxed to ...       [facility] (retail)       [address]       [place], [place]  [address]       Ph: [phone]       Fax: [phone]   RxID:   [id number]    Orders Added: 1)  Est. Patient Level IV [07371] 2)  T-Basic Metabolic Panel [phone] 3)  T-Lipid Profile [80061-22930] 4)  T-Hepatic Function [80076-22960] 5)  T- Hemoglobin A1C [83036-23375] 6)  T-HIV Antibody  (Reflex) [[id number]] 7)  T-Syphilis Test (RPR) [18299-37169] 8)  T-Vitamin D (25-Hydroxy) [67893-81017] 9)  Urinalysis [81003-65000]    Laboratory  Results   Urine Tests  Date/Time Received: 03/26/10  Date/Time Reported: 03/26/10  Routine Urinalysis   Color:  yellow Appearance: Clear Glucose: negative   (Normal Range: Negative) Bilirubin: negative   (Normal Range: Negative) Ketone: negative   (Normal Range: Negative) Spec. Gravity: 1.020   (Normal Range: 1.003-1.035) Blood: negative   (Normal Range: Negative) pH: 7.0   (Normal Range: 5.0-8.0) Protein: negative   (Normal Range: Negative) Urobilinogen: negative   (Normal Range: 0-1) Nitrite: negative   (Normal Range: Negative) Leukocyte Esterace: negative   (Normal Range: Negative)

## 2010-05-22 NOTE — Letter (Signed)
Summary: diabetic shoes rx  diabetic shoes rx   Imported By: Lind Guest 04/02/2010 13:50:40  _____________________________________________________________________  External Attachment:    Type:   Image     Comment:   External Document

## 2010-05-25 ENCOUNTER — Telehealth: Payer: Self-pay | Admitting: Family Medicine

## 2010-06-05 NOTE — Progress Notes (Signed)
Summary: stool cards  Phone Note Other Incoming   Caller: dr simpson Summary of Call: pt needs to be contacted to bring in 3 stool cards for testing, no documentation during her recent physical of stool testing being done, pls call and let her know, and provide her with the cards Initial call taken by: Syliva Overman MD,  May 25, 2010 8:52 PM  Follow-up for Phone Call        pt was notified and told see needed stool cards. she said she would be in tomorrow to get this. Follow-up by: Rudene Anda,  May 26, 2010 9:02 AM  Additional Follow-up for Phone Call Additional follow up Details #1::        noted Additional Follow-up by: Everitt Amber LPN,  May 26, 2010 11:04 AM

## 2010-06-05 NOTE — Assessment & Plan Note (Signed)
Summary: phy   Vital Signs:  Patient profile:   55 year old female Menstrual status:  hysterectomy Height:      63 inches Weight:      256 pounds BMI:     45.51 O2 Sat:      98 % Pulse rate:   89 / minute Pulse rhythm:   regular Resp:     16 per minute BP sitting:   150 / 90  (left arm) Cuff size:   large  Vitals Entered By: Everitt Amber LPN (May 08, 2010 3:05 PM)  Nutrition Counseling: Patient's BMI is greater than 25 and therefore counseled on weight management options. CC: CPE    Primary Care Provider:  Syliva Overman MD  CC:  CPE .  History of Present Illness: Reports  that sh eis doing fairly well, except for continued stressof low/no finances, and family stresses Denies recent fever or chills. Denies sinus pressure, nasal congestion , ear pain or sore throat. Denies chest congestion, or cough productive of sputum. Denies chest pain, palpitations, PND, orthopnea or leg swelling. Denies abdominal pain, nausea, vomitting, diarrhea or constipation. Denies change in bowel movements or bloody stool. Denies dysuria , frequency, incontinence or hesitancy.  Denies headaches, vertigo, seizures.  Denies  rash, lesions, or itch.     Allergies (verified): 1)  ! * Victoza  Review of Systems      See HPI General:  Complains of fatigue; denies chills and loss of appetite. Eyes:  Denies discharge, double vision, and red eye. MS:  Complains of joint pain, loss of strength, low back pain, mid back pain, stiffness, and thoracic pain; right shoulder pain, fell this past tuesday, inc pain on righ knee, which is new and causing her to limp, will see ortho today. Psych:  Complains of anxiety and depression. Endo:  Complains of excessive thirst and excessive urination; blood sugars when checkedare higher than they should be. Heme:  Denies abnormal bruising and bleeding. Allergy:  Denies hives or rash and persistent infections.  Physical Exam  General:   Well-developed,morbidly obese,in no acute distress; alert,appropriate and cooperative throughout examination Head:  Normocephalic and atraumatic without obvious abnormalities. No apparent alopecia or balding. Eyes:  No corneal or conjunctival inflammation noted. EOMI. Perrla. Funduscopic exam benign, without hemorrhages, exudates or papilledema. Vision grossly normal. Ears:  External ear exam shows no significant lesions or deformities.  Otoscopic examination reveals clear canals, tympanic membranes are intact bilaterally without bulging, retraction, inflammation or discharge. Hearing is grossly normal bilaterally. Nose:  External nasal examination shows no deformity or inflammation. Nasal mucosa are pink and moist without lesions or exudates. Mouth:  pharynx pink and moist and fair dentition.   Neck:  No deformities, masses, or tenderness noted. Chest Wall:  No deformities, masses, or tenderness noted. Breasts:  No mass, nodules, thickening, tenderness, bulging, retraction, inflamation, nipple discharge or skin changes noted.   Lungs:  Normal respiratory effort, chest expands symmetrically. Lungs are clear to auscultation, no crackles or wheezes. Heart:  Normal rate and regular rhythm. S1 and S2 normal without gallop, murmur, click, rub or other extra sounds. Abdomen:  Bowel sounds positive,abdomen soft and non-tender without masses, organomegaly or hernias noted. Rectal:  No external abnormalities noted. Normal sphincter tone. No rectal masses or tenderness. Genitalia:  Normal introitus for age, no external lesions, no vaginal discharge, mucosa pink and moist, no vaginal or cervical lesions, no vaginal atrophy, no friaility or hemorrhage, normal uterus size and position, no adnexal masses or tenderness Msk:  No deformity or scoliosis noted of thoracic or lumbar spine.   Pulses:  R and L carotid,radial,femoral,dorsalis pedis and posterior tibial pulses are full and equal bilaterally Extremities:   decreased ROM spine, hips shouldeers and knees Neurologic:  No cranial nerve deficits noted. Station and gait are normal. Plantar reflexes are down-going bilaterally. DTRs are symmetrical throughout. Sensory, motor and coordinative functions appear intact. Skin:  Intact without suspicious lesions or rashes Cervical Nodes:  No lymphadenopathy noted Axillary Nodes:  No palpable lymphadenopathy Inguinal Nodes:  No significant adenopathy Psych:  Oriented X3 and memory intact for recent and remote.  anxious and depressed, easily tearful   Impression & Recommendations:  Problem # 1:  PHYSICAL EXAMINATION (ICD-V70.0) Assessment Comment Only importance of regular exrcise andhealthy  diet discussed. Pap sent.  Problem # 2:  IDDM (ICD-250.01) Assessment: Deteriorated  Her updated medication list for this problem includes:    Adult Aspirin Ec Low Strength 81 Mg Tbec (Aspirin) ..... One tab by mouth qd    Metformin Hcl 1000 Mg Tabs (Metformin hcl) .Marland Kitchen... Take 1 tablet by mouth two times a day discontinue janumet    Glipizide 10 Mg Xr24h-tab (Glipizide) .Marland Kitchen... Take 1 tablet by mouth two times a day Patient advised to reduce carbs and sweets, commit to regular physical activity, take meds as prescribed, test blood sugars as directed, and attempt to lose weight , to improve blood sugar control.  Labs Reviewed: Creat: 0.68 (03/29/2010)    Reviewed HgBA1c results: 8.5 (03/29/2010)  6.6 (08/20/2009)  Problem # 3:  HYPERTENSION (ICD-401.9) Assessment: Deteriorated  Her updated medication list for this problem includes:    Cardizem La 420 Mg Tb24 (Diltiazem hcl coated beads) ..... One tab by mouth once daily Patient advised to follow low sodium diet rich in fruit and vegetables, and to commit to at least 30 minutes 5 days per week of regular exercise , to improve blood presure control.   BP today: 150/90 Prior BP: 126/80 (03/26/2010)  Labs Reviewed: K+: 4.0 (03/29/2010) Creat: : 0.68 (03/29/2010)    Chol: 209 (03/29/2010)   HDL: 64 (03/29/2010)   LDL: 132 (03/29/2010)   TG: 66 (03/29/2010)  Problem # 4:  OTHER ANXIETY STATES (ICD-300.09) Assessment: Unchanged  Her updated medication list for this problem includes:    Alprazolam 1 Mg Tabs (Alprazolam) .Marland Kitchen... 1 twice daily and 2 at night  Complete Medication List: 1)  Cardizem La 420 Mg Tb24 (Diltiazem hcl coated beads) .... One tab by mouth once daily 2)  Omeprazole 40 Mg Cpdr (Omeprazole) .... Take one cap by mouth once daily 3)  Alprazolam 1 Mg Tabs (Alprazolam) .Marland Kitchen.. 1 twice daily and 2 at night 4)  Ibuprofen 800 Mg Tabs (Ibuprofen) .... One tab by mouth tid 5)  Proventil Hfa 108 (90 Base) Mcg/act Aers (Albuterol sulfate) .... Two puffs every six to eight hours prn 6)  Adult Aspirin Ec Low Strength 81 Mg Tbec (Aspirin) .... One tab by mouth qd 7)  Bd Pen Needle Ultrafine 29g X 12.82mm Misc (Insulin pen needle) .... Uad 8)  Trilipix 135 Mg Cpdr (Choline fenofibrate) .... Take 1 capsule by mouth at bedtime 9)  Qvar 40 Mcg/act Aers (Beclomethasone dipropionate) .... 2 puffs twice daily 10)  Vitamin D (ergocalciferol) 50000 Unit Caps (Ergocalciferol) .... One cap by mouth every week 11)  Cyclobenzaprine Hcl 10 Mg Tabs (Cyclobenzaprine hcl) .... Take 1 tab by mouth at bedtime as needed 12)  Metformin Hcl 1000 Mg Tabs (Metformin hcl) .... Take 1 tablet  by mouth two times a day discontinue janumet 13)  Clotrimazole-betamethasone 1-0.05 % Crea (Clotrimazole-betamethasone) .... Apply twice dailly for 10 days, then as needed 14)  Phentermine Hcl 37.5 Mg Tabs (Phentermine hcl) .... Take 1 tablet by mouth once a day 15)  Clotrimazole 1 % Crea (Clotrimazole) .... Apply twice daily to soles of feet x 4 weeks, then as needed 16)  Glipizide 10 Mg Xr24h-tab (Glipizide) .... Take 1 tablet by mouth two times a day 17)  Pravachol 40 Mg Tabs (Pravastatin sodium) .... Two tablets at bedtime  Patient Instructions: 1)  Please schedule a follow-up  appointment in 1 month. 2)  It is important that you exercise regularly at least 60 minutes 5 times a week. If you develop chest pain, have severe difficulty breathing, or feel very tired , stop exercising immediately and seek medical attention. 3)  You need to lose weight. Consider a lower calorie diet and regular exercise.  4)  Check your blood sugars regularly. If your readings are usually abov250: or below 70 you should contact our office. 5)  Your BP is high today, pls take meds as prescribed   Orders Added: 1)  Est. Patient 40-64 years [99396]        Appended Document: phy correction on the genital exam, pt hasno uterus

## 2010-06-13 ENCOUNTER — Encounter: Payer: Self-pay | Admitting: Family Medicine

## 2010-06-17 NOTE — Letter (Signed)
Summary: diabetic shoes  diabetic shoes   Imported By: Lind Guest 06/13/2010 14:23:26  _____________________________________________________________________  External Attachment:    Type:   Image     Comment:   External Document

## 2010-06-17 NOTE — Letter (Signed)
Summary: diabetic supplies  diabetic supplies   Imported By: Lind Guest 06/13/2010 14:23:55  _____________________________________________________________________  External Attachment:    Type:   Image     Comment:   External Document

## 2010-07-01 ENCOUNTER — Encounter: Payer: Self-pay | Admitting: Family Medicine

## 2010-07-01 ENCOUNTER — Telehealth (INDEPENDENT_AMBULATORY_CARE_PROVIDER_SITE_OTHER): Payer: Self-pay | Admitting: *Deleted

## 2010-07-01 ENCOUNTER — Ambulatory Visit (INDEPENDENT_AMBULATORY_CARE_PROVIDER_SITE_OTHER): Payer: Medicaid Other | Admitting: Family Medicine

## 2010-07-01 DIAGNOSIS — F329 Major depressive disorder, single episode, unspecified: Secondary | ICD-10-CM

## 2010-07-01 DIAGNOSIS — M545 Low back pain: Secondary | ICD-10-CM

## 2010-07-01 DIAGNOSIS — L259 Unspecified contact dermatitis, unspecified cause: Secondary | ICD-10-CM | POA: Insufficient documentation

## 2010-07-01 DIAGNOSIS — E109 Type 1 diabetes mellitus without complications: Secondary | ICD-10-CM

## 2010-07-01 DIAGNOSIS — I10 Essential (primary) hypertension: Secondary | ICD-10-CM

## 2010-07-01 LAB — CONVERTED CEMR LAB
Bilirubin Urine: NEGATIVE
Blood Glucose, Fasting: 186 mg/dL
Blood in Urine, dipstick: NEGATIVE
Glucose, Urine, Semiquant: NEGATIVE
Ketones, urine, test strip: NEGATIVE
Nitrite: NEGATIVE
Specific Gravity, Urine: 1.02
pH: 7

## 2010-07-01 LAB — HM DIABETES FOOT EXAM

## 2010-07-02 ENCOUNTER — Telehealth: Payer: Self-pay | Admitting: Family Medicine

## 2010-07-02 LAB — BASIC METABOLIC PANEL
BUN: 8 mg/dL (ref 6–23)
CO2: 25 mEq/L (ref 19–32)
Chloride: 99 mEq/L (ref 96–112)
Creatinine, Ser: 0.59 mg/dL (ref 0.4–1.2)
Glucose, Bld: 214 mg/dL — ABNORMAL HIGH (ref 70–99)
Potassium: 3.8 mEq/L (ref 3.5–5.1)

## 2010-07-04 LAB — GLUCOSE, CAPILLARY
Glucose-Capillary: 130 mg/dL — ABNORMAL HIGH (ref 70–99)
Glucose-Capillary: 151 mg/dL — ABNORMAL HIGH (ref 70–99)
Glucose-Capillary: 211 mg/dL — ABNORMAL HIGH (ref 70–99)
Glucose-Capillary: 64 mg/dL — ABNORMAL LOW (ref 70–99)
Glucose-Capillary: 70 mg/dL (ref 70–99)
Glucose-Capillary: 71 mg/dL (ref 70–99)
Glucose-Capillary: 79 mg/dL (ref 70–99)
Glucose-Capillary: 84 mg/dL (ref 70–99)
Glucose-Capillary: 96 mg/dL (ref 70–99)

## 2010-07-04 LAB — COMPREHENSIVE METABOLIC PANEL
ALT: 18 U/L (ref 0–35)
AST: 19 U/L (ref 0–37)
Calcium: 9.1 mg/dL (ref 8.4–10.5)
GFR calc Af Amer: >60 mL/min (ref >60)
Sodium: 141 mEq/L (ref 135–145)
Total Protein: 6.7 g/dL (ref 6.0–8.3)

## 2010-07-04 LAB — CBC
MCHC: 31.9 g/dL (ref 30.0–36.0)
RDW: 13.6 % (ref 11.5–15.5)

## 2010-07-04 LAB — SURGICAL PCR SCREEN: Staphylococcus aureus: NEGATIVE

## 2010-07-05 LAB — CBC
Hemoglobin: 12.5 g/dL (ref 12.0–15.0)
MCH: 29.6 pg (ref 26.0–34.0)
MCV: 87.3 fL (ref 78.0–100.0)
RBC: 4.23 MIL/uL (ref 3.87–5.11)
WBC: 6.5 10*3/uL (ref 4.0–10.5)

## 2010-07-05 LAB — BASIC METABOLIC PANEL
CO2: 27 mEq/L (ref 19–32)
Chloride: 108 mEq/L (ref 96–112)
GFR calc Af Amer: >60 mL/min (ref >60)
Potassium: 4 mEq/L (ref 3.5–5.1)
Sodium: 139 mEq/L (ref 135–145)

## 2010-07-05 LAB — DIFFERENTIAL
Basophils Relative: 0 % (ref 0–1)
Eosinophils Relative: 2 % (ref 0–5)
Lymphs Abs: 2.3 10*3/uL (ref 0.7–4.0)
Monocytes Absolute: 0.2 10*3/uL (ref 0.1–1.0)
Monocytes Relative: 3 % (ref 3–12)

## 2010-07-05 LAB — POCT CARDIAC MARKERS
CKMB, poc: <1 ng/mL — ABNORMAL LOW (ref 1.0–8.0)
Troponin i, poc: <0.05 ng/mL (ref 0.00–0.09)

## 2010-07-06 LAB — CBC
Hemoglobin: 12.5 g/dL (ref 12.0–15.0)
MCH: 29.3 pg (ref 26.0–34.0)
MCHC: 33.7 g/dL (ref 30.0–36.0)
Platelets: 148 10*3/uL — ABNORMAL LOW (ref 150–400)
RBC: 4.28 MIL/uL (ref 3.87–5.11)

## 2010-07-06 LAB — DIFFERENTIAL
Eosinophils Absolute: 0.1 10*3/uL (ref 0.0–0.7)
Eosinophils Relative: 2 % (ref 0–5)
Lymphocytes Relative: 42 % (ref 12–46)
Lymphs Abs: 2.6 10*3/uL (ref 0.7–4.0)
Monocytes Absolute: 0.4 10*3/uL (ref 0.1–1.0)
Monocytes Relative: 6 % (ref 3–12)

## 2010-07-06 LAB — COMPREHENSIVE METABOLIC PANEL
ALT: 16 U/L (ref 0–35)
AST: 18 U/L (ref 0–37)
Albumin: 3.4 g/dL — ABNORMAL LOW (ref 3.5–5.2)
CO2: 26 mEq/L (ref 19–32)
Calcium: 8.6 mg/dL (ref 8.4–10.5)
Creatinine, Ser: 0.73 mg/dL (ref 0.4–1.2)
GFR calc Af Amer: >60 mL/min (ref >60)
GFR calc non Af Amer: >60 mL/min (ref >60)
Sodium: 141 mEq/L (ref 135–145)

## 2010-07-06 LAB — BRAIN NATRIURETIC PEPTIDE: Pro B Natriuretic peptide (BNP): <30 pg/mL (ref 0.0–100.0)

## 2010-07-08 NOTE — Progress Notes (Signed)
Summary: referrals  Phone Note Call from Patient   Summary of Call: After you left the room she wanted me to ask you if she could have a dermatology referral for her face that keeps breaking out Initial call taken by: Everitt Amber LPN,  July 01, 2010 10:57 AM  Follow-up for Phone Call        pls refer to derm re acne Follow-up by: Syliva Overman MD,  July 01, 2010 12:00 PM  Additional Follow-up for Phone Call Additional follow up Details #1::        pt was referred to dr.halls office. Additional Follow-up by: Rudene Anda,  July 02, 2010 3:31 PM  New Problems: DERMATITIS (ICD-692.9)   New Problems: DERMATITIS (ICD-692.9)

## 2010-07-08 NOTE — Progress Notes (Signed)
Summary: refill  Phone Note Call from Patient   Summary of Call: pt needs to get phentermine called into [facility]. pt states it was suppose to be sent yesterday and she there now. But they don't have it Initial call taken by: Rudene Anda,  July 02, 2010 1:18 PM    Prescriptions: PHENTERMINE HCL 37.5 MG TABS (PHENTERMINE HCL) Take 1 tablet by mouth once a day  #30 x 1   Entered by:   Adella Hare LPN   Authorized by:   Syliva Overman MD   Signed by:   Adella Hare LPN on 04/54/0981   Method used:   Printed then faxed to ...       [facility] [phone]* (retail)       [address] [place], [place]  [address]       Ph: [phone]       Fax: [phone]   RxID:   [id number]

## 2010-07-11 ENCOUNTER — Emergency Department (HOSPITAL_COMMUNITY): Payer: Medicaid Other

## 2010-07-11 ENCOUNTER — Emergency Department (HOSPITAL_COMMUNITY)
Admission: EM | Admit: 2010-07-11 | Discharge: 2010-07-11 | Disposition: A | Discharge status: Home / Self Care | Payer: Medicaid Other | Attending: Emergency Medicine | Admitting: Emergency Medicine

## 2010-07-11 ENCOUNTER — Other Ambulatory Visit: Payer: Self-pay | Admitting: Family Medicine

## 2010-07-11 DIAGNOSIS — I10 Essential (primary) hypertension: Secondary | ICD-10-CM | POA: Insufficient documentation

## 2010-07-11 DIAGNOSIS — Y92410 Unspecified street and highway as the place of occurrence of the external cause: Secondary | ICD-10-CM | POA: Insufficient documentation

## 2010-07-11 DIAGNOSIS — S0993XA Unspecified injury of face, initial encounter: Secondary | ICD-10-CM | POA: Insufficient documentation

## 2010-07-11 DIAGNOSIS — Z79899 Other long term (current) drug therapy: Secondary | ICD-10-CM | POA: Insufficient documentation

## 2010-07-11 DIAGNOSIS — E119 Type 2 diabetes mellitus without complications: Secondary | ICD-10-CM | POA: Insufficient documentation

## 2010-07-11 DIAGNOSIS — M1990 Unspecified osteoarthritis, unspecified site: Secondary | ICD-10-CM | POA: Insufficient documentation

## 2010-07-11 DIAGNOSIS — E7800 Pure hypercholesterolemia, unspecified: Secondary | ICD-10-CM | POA: Insufficient documentation

## 2010-07-11 DIAGNOSIS — K219 Gastro-esophageal reflux disease without esophagitis: Secondary | ICD-10-CM | POA: Insufficient documentation

## 2010-07-11 DIAGNOSIS — E669 Obesity, unspecified: Secondary | ICD-10-CM | POA: Insufficient documentation

## 2010-07-11 DIAGNOSIS — S139XXA Sprain of joints and ligaments of unspecified parts of neck, initial encounter: Secondary | ICD-10-CM | POA: Insufficient documentation

## 2010-07-11 DIAGNOSIS — R51 Headache: Secondary | ICD-10-CM | POA: Insufficient documentation

## 2010-07-11 LAB — GLUCOSE, CAPILLARY

## 2010-07-14 ENCOUNTER — Telehealth: Payer: Self-pay | Admitting: Family Medicine

## 2010-07-16 NOTE — Telephone Encounter (Signed)
pls let pt know and set up appt for chiropracter eval and treatament, I will enter order

## 2010-07-17 NOTE — Assessment & Plan Note (Signed)
Summary: office visit   Vital Signs:  Patient profile:   55 year old female Menstrual status:  hysterectomy Height:      63 inches Weight:      259 pounds BMI:     46.05 O2 Sat:      98 % Pulse rate:   83 / minute Pulse rhythm:   regular Resp:     16 per minute BP sitting:   124 / 84  (left arm) Cuff size:   large  Vitals Entered By: Everitt Amber LPN (July 01, 2010 10:08 AM)  Nutrition Counseling: Patient's BMI is greater than 25 and therefore counseled on weight management options. CC: Follow up chronic problems, c/o lower back pain for a few days, wants urine checked   Primary Care Provider:  Syliva Overman MD  CC:  Follow up chronic problems, c/o lower back pain for a few days, and wants urine checked.  History of Present Illness: Reports  that  she is not doing well.Staes she is becoming depressed again because of no money, and the fact that her weight is stationary. She is tearful. Denies recent fever or chills. Denies sinus pressure, nasal congestion , ear pain or sore throat. Denies chest congestion, or cough productive of sputum. Denies chest pain, palpitations, PND, orthopnea or leg swelling. Denies abdominal pain, nausea, vomitting, diarrhea or constipation. Denies change in bowel movements or bloody stool. Denies dysuria , frequency, incontinence or hesitancy.  Denies headaches, vertigo, seizures.    Allergies: 1)  ! * Victoza  Review of Systems      See HPI General:  Complains of fatigue. Eyes:  Denies discharge and red eye. MS:  Complains of low back pain and mid back pain. Derm:  Complains of rash; c/o facial acne, wants a derm referral. Psych:  Complains of anxiety, depression, easily tearful, and mental problems; denies suicidal thoughts/plans, thoughts of violence, and unusual visions or sounds; overwhelmed and frustrated, states she has no money. Endo:  Denies cold intolerance, excessive hunger, excessive thirst, and excessive urination; blood  sugars are often over 150 when checked as fastings. Heme:  Denies abnormal bruising, bleeding, and enlarge lymph nodes. Allergy:  Complains of seasonal allergies.  Physical Exam  General:  Well-developed,morbidly obese,in no acute distress; alert,appropriate and cooperative throughout examination HEENT: No facial asymmetry,  EOMI, No sinus tenderness, TM's Clear, oropharynx  pink and moist.   Chest: Clear to auscultation bilaterally.  CVS: S1, S2, No murmurs, No S3.   Abd: Soft, Nontender.  WJ:[id number]  ROM spine, hips, shoulders and knees.  Ext: No edema.   CNS: CN 2-12 intact, power and  tone  normal throughout. decreasedsensation in feet.  Skin: Intact, no visible lesions or rashes.  Psych: Good eye contact, normal affect.  Memory intact, not anxious or depressed appearing.   Diabetes Management Exam:    Foot Exam (with socks and/or shoes not present):       Sensory-Monofilament:          Left foot: diminished          Right foot: diminished       Inspection:          Left foot: normal          Right foot: normal       Nails:          Left foot: thickened          Right foot: thickened   Impression & Recommendations:  Problem # 1:  LOW BACK PAIN, MILD (ICD-724.2) Assessment Comment Only  Her updated medication list for this problem includes:    Ibuprofen 800 Mg Tabs (Ibuprofen) ..... One tab by mouth tid    Adult Aspirin Ec Low Strength 81 Mg Tbec (Aspirin) ..... One tab by mouth qd    Cyclobenzaprine Hcl 10 Mg Tabs (Cyclobenzaprine hcl) .Marland Kitchen... Take 1 tab by mouth at bedtime as needed  Orders: UA Dipstick W/ Micro (manual) (63875)  Discussed use of moist heat or ice, modified activities, medications, and stretching/strengthening exercises. Back care instructions given. To be seen in 2 weeks if no improvement; sooner if worsening of symptoms.   Problem # 2:  OBESITY, UNSPECIFIED (ICD-278.00) Assessment: Unchanged  Ht: 63 (07/01/2010)   Wt: 259 (07/01/2010)   BMI:  46.05 (07/01/2010) therapeutic lifestyle change discussed and encouraged  Problem # 3:  IDDM (ICD-250.01) Assessment: Deteriorated  Her updated medication list for this problem includes:    Adult Aspirin Ec Low Strength 81 Mg Tbec (Aspirin) ..... One tab by mouth qd    Metformin Hcl 1000 Mg Tabs (Metformin hcl) .Marland Kitchen... Take 1 tablet by mouth two times a day discontinue janumet    Glipizide 10 Mg Xr24h-tab (Glipizide) .Marland Kitchen... Take 1 tablet by mouth two times a day Patient advised to reduce carbs and sweets, commit to regular physical activity, take meds as prescribed, test blood sugars as directed, and attempt to lose weight , to improve blood sugar control.  Orders: Glucose, (CBG) (82962) T- Hemoglobin A1C [phone])  Labs Reviewed: Creat: 0.68 (03/29/2010)    Reviewed HgBA1c results: 8.5 (03/29/2010)  6.6 (08/20/2009)  Problem # 4:  HYPERTENSION (ICD-401.9) Assessment: Improved  Her updated medication list for this problem includes:    Cardizem La 420 Mg Tb24 (Diltiazem hcl coated beads) ..... One tab by mouth once daily  Orders: T-Basic Metabolic Panel [id number])  BP today: 124/84 Prior BP: 150/90 (05/08/2010)  Labs Reviewed: K+: 4.0 (03/29/2010) Creat: : 0.68 (03/29/2010)   Chol: 209 (03/29/2010)   HDL: 64 (03/29/2010)   LDL: 132 (03/29/2010)   TG: 66 (03/29/2010)  Complete Medication List: 1)  Cardizem La 420 Mg Tb24 (Diltiazem hcl coated beads) .... One tab by mouth once daily 2)  Omeprazole 40 Mg Cpdr (Omeprazole) .... Take one cap by mouth once daily 3)  Alprazolam 1 Mg Tabs (Alprazolam) .Marland Kitchen.. 1 twice daily and 2 at night 4)  Ibuprofen 800 Mg Tabs (Ibuprofen) .... One tab by mouth tid 5)  Proventil Hfa 108 (90 Base) Mcg/act Aers (Albuterol sulfate) .... Two puffs every six to eight hours prn 6)  Adult Aspirin Ec Low Strength 81 Mg Tbec (Aspirin) .... One tab by mouth qd 7)  Bd Pen Needle Ultrafine 29g X 12.15mm Misc (Insulin pen needle) .... Uad 8)  Trilipix 135 Mg  Cpdr (Choline fenofibrate) .... Take 1 capsule by mouth at bedtime 9)  Qvar 40 Mcg/act Aers (Beclomethasone dipropionate) .... 2 puffs twice daily 10)  Vitamin D (ergocalciferol) 50000 Unit Caps (Ergocalciferol) .... One cap by mouth every week 11)  Cyclobenzaprine Hcl 10 Mg Tabs (Cyclobenzaprine hcl) .... Take 1 tab by mouth at bedtime as needed 12)  Metformin Hcl 1000 Mg Tabs (Metformin hcl) .... Take 1 tablet by mouth two times a day discontinue janumet 13)  Clotrimazole-betamethasone 1-0.05 % Crea (Clotrimazole-betamethasone) .... Apply twice dailly for 10 days, then as needed 14)  Phentermine Hcl 37.5 Mg Tabs (Phentermine hcl) .... Take 1 tablet by mouth once a day 15)  Clotrimazole 1 % Crea (  Clotrimazole) .... Apply twice daily to soles of feet x 4 weeks, then as needed 16)  Glipizide 10 Mg Xr24h-tab (Glipizide) .... Take 1 tablet by mouth two times a day 17)  Pravachol 40 Mg Tabs (Pravastatin sodium) .... Two tablets at bedtime 18)  Accu-chek Aviva Kit (Blood glucose monitoring suppl) .... Four times a day testing dx: 250.00 19)  Accu-chek Aviva Strp (Glucose blood) .... Four times daily testing dx:250.00 20)  Accu-chek Multiclix Lancet Dev Kit (Lancets misc.) .... Four times a day testing dx: 250.00 21)  Accu-chek Multiclix Lancets Misc (Lancets) .... Four times daily testing  dx: 250.00 22)  Venlafaxine Hcl 37.5 Mg Tabs (Venlafaxine hcl) .... Take 1 tablet by mouth once a day  Other Orders: T-Hepatic Function [id number]) T-Lipid Profile [id number]) T-CBC w/Diff ([id number]) T-TSH [phone])  Patient Instructions: 1)  F/u in 4 to 5 weeks 2)  It is important that you exercise regularly at least 20 minutes 5 times a week. If you develop chest pain, have severe difficulty breathing, or feel very tired , stop exercising immediately and seek medical attention. 3)  You need to lose weight. Consider a lower calorie diet and regular exercise.  4)  BMP prior to visit, ICD-9: 5)   Hepatic Panel prior to visit, ICD-9: 6)  Lipid Panel prior to visit, ICD-9: 7)  TSH prior to visit, ICD-9:   today 8)  CBC w/ Diff prior to visit, ICD-9: 9)  HbgA1C prior to visit, ICD-9: 10)  pLS start taking metformin tWIcE daily as prescribed Prescriptions: METFORMIN HCL 1000 MG TABS (METFORMIN HCL) Take 1 tablet by mouth two times a day discontinue janumet  #60 x 3   Entered by:   Everitt Amber LPN   Authorized by:   Syliva Overman MD   Signed by:   Everitt Amber LPN on 74/25/9563   Method used:   Electronically to        [facility]* (retail)       [address] [place], [place]  [address]       Ph: [phone]       Fax: [phone]   RxID:   [id number] GLIPIZIDE 10 MG XR24H-TAB (GLIPIZIDE) Take 1 tablet by mouth two times a day  #60 x 3   Entered by:   Everitt Amber LPN   Authorized by:   Syliva Overman MD   Signed by:   Everitt Amber LPN on 70/62/3762   Method used:   Electronically to        [facility]* (retail)       [address] [place], [place]  [address]       Ph: [phone]       Fax: [phone]   RxID:   [id number] PRAVACHOL 40 MG TABS (PRAVASTATIN SODIUM) two tablets at bedtime  #60 x 3   Entered by:   Everitt Amber LPN   Authorized by:   Syliva Overman MD   Signed by:   Everitt Amber LPN on 16/96/7893   Method used:   Electronically to        [facility]* (retail)       [address] [place], [place]  [address]       Ph: [phone]  Fax: [phone]   RxID:   [id number] IBUPROFEN 800 MG TABS (IBUPROFEN) ONE TAB by mouth TID  #30 Each x 3   Entered by:   Everitt Amber LPN   Authorized by:   Syliva Overman MD   Signed by:   Everitt Amber LPN on 44/06/4740   Method used:   Electronically to        [facility]* (retail)       [address]       [place], [place]  [address]        Ph: [phone]       Fax: [phone]   RxID:   [id number] OMEPRAZOLE 40 MG CPDR (OMEPRAZOLE) take one cap by mouth once daily  #30 Each x 3   Entered by:   Everitt Amber LPN   Authorized by:   Syliva Overman MD   Signed by:   Everitt Amber LPN on 02/54/2706   Method used:   Electronically to        [facility]* (retail)       [address] [place], [place]  [address]       Ph: [phone]       Fax: [phone]   RxID:   [id number] CARDIZEM LA 420 MG  TB24 (DILTIAZEM HCL COATED BEADS) one tab by mouth once daily  #30 Each x 3   Entered by:   Everitt Amber LPN   Authorized by:   Syliva Overman MD   Signed by:   Everitt Amber LPN on 29/93/7169   Method used:   Electronically to        [facility]* (retail)       [address] [place], [place]  [address]       Ph: [phone]       Fax: [phone]   RxID:   [id number] VENLAFAXINE HCL 37.5 MG TABS (VENLAFAXINE HCL) Take 1 tablet by mouth once a day  #30 x 3   Entered and Authorized by:   Syliva Overman MD   Signed by:   Syliva Overman MD on 07/01/2010   Method used:   Electronically to        [facility]* (retail)       [address]       [place], [place]  [address]       Ph: [phone]       Fax: [phone]   RxID:   [id number]    Orders Added: 1)  Glucose, (CBG) [82962] 2)  UA Dipstick W/ Micro (manual) [81000] 3)  Est. Patient Level IV [97353] 4)  T-Basic Metabolic Panel [80048-22910] 5)  T-Hepatic Function [80076-22960] 6)  T-Lipid Profile [80061-22930] 7)  T-CBC w/Diff [29924-26834] 8)  T-TSH [19622-29798] 9)  T- Hemoglobin A1C [83036-23375]    Laboratory Results   Urine Tests    Routine Urinalysis   Color: yellow Appearance: Clear Glucose: negative   (Normal Range: Negative) Bilirubin: negative   (Normal Range: Negative) Ketone: negative   (Normal Range:  Negative) Spec. Gravity: 1.020   (Normal Range: 1.003-1.035) Blood: negative   (Normal Range: Negative) pH: 7.0   (Normal Range: 5.0-8.0) Protein: negative   (Normal Range: Negative) Urobilinogen: 0.2   (Normal Range: 0-1)  Nitrite: negative   (Normal Range: Negative) Leukocyte Esterace: negative   (Normal Range: Negative)     Blood Tests     Glucose (fasting): 186 mg/dL   (Normal Range: 16-109)

## 2010-07-23 LAB — CBC
HCT: 37.6 % (ref 36.0–46.0)
Hemoglobin: 12.6 g/dL (ref 12.0–15.0)
MCV: 87.5 fL (ref 78.0–100.0)
WBC: 8.5 10*3/uL (ref 4.0–10.5)

## 2010-07-23 LAB — BASIC METABOLIC PANEL
BUN: 16 mg/dL (ref 6–23)
Chloride: 104 mEq/L (ref 96–112)
Glucose, Bld: 48 mg/dL — ABNORMAL LOW (ref 70–99)
Potassium: 3.7 mEq/L (ref 3.5–5.1)

## 2010-07-23 LAB — GLUCOSE, CAPILLARY
Glucose-Capillary: 132 mg/dL — ABNORMAL HIGH (ref 70–99)
Glucose-Capillary: 21 mg/dL — CL (ref 70–99)
Glucose-Capillary: 83 mg/dL (ref 70–99)

## 2010-07-23 LAB — POCT CARDIAC MARKERS: Myoglobin, poc: 56.4 ng/mL (ref 12–200)

## 2010-07-23 LAB — DIFFERENTIAL
Band Neutrophils: 0 % (ref 0–10)
Basophils Absolute: 0 10*3/uL (ref 0.0–0.1)
Eosinophils Absolute: 0.1 10*3/uL (ref 0.0–0.7)
Eosinophils Relative: 1 % (ref 0–5)
Metamyelocytes Relative: 0 %
Myelocytes: 0 %
nRBC: 0 /100 WBC

## 2010-07-28 ENCOUNTER — Telehealth: Payer: Self-pay | Admitting: Family Medicine

## 2010-07-30 ENCOUNTER — Telehealth: Payer: Self-pay | Admitting: Family Medicine

## 2010-07-30 ENCOUNTER — Other Ambulatory Visit: Payer: Self-pay | Admitting: *Deleted

## 2010-07-30 MED ORDER — PHENTERMINE HCL 37.5 MG PO TABS: 37.5000 mg | ORAL_TABLET | Freq: Every day | ORAL | Status: DC

## 2010-07-30 NOTE — Telephone Encounter (Signed)
Med sent as requested 

## 2010-08-05 LAB — URINALYSIS, ROUTINE W REFLEX MICROSCOPIC
Glucose, UA: 500 mg/dL — AB
Hgb urine dipstick: NEGATIVE
Specific Gravity, Urine: >1.03 — ABNORMAL HIGH (ref 1.005–1.030)
pH: 5.5 (ref 5.0–8.0)

## 2010-08-05 LAB — URINE CULTURE: Colony Count: 6000

## 2010-08-05 LAB — WET PREP, GENITAL: Clue Cells Wet Prep HPF POC: NONE SEEN

## 2010-08-05 LAB — GC/CHLAMYDIA PROBE AMP, GENITAL: Chlamydia, DNA Probe: NEGATIVE

## 2010-08-05 LAB — GLUCOSE, CAPILLARY: Glucose-Capillary: 370 mg/dL — ABNORMAL HIGH (ref 70–99)

## 2010-08-11 ENCOUNTER — Encounter: Payer: Self-pay | Admitting: Family Medicine

## 2010-08-12 ENCOUNTER — Encounter: Payer: Self-pay | Admitting: Family Medicine

## 2010-08-13 ENCOUNTER — Ambulatory Visit: Payer: Medicaid Other | Admitting: Family Medicine

## 2010-08-18 NOTE — Telephone Encounter (Signed)
error 

## 2010-08-19 ENCOUNTER — Encounter: Payer: Self-pay | Admitting: Family Medicine

## 2010-08-20 ENCOUNTER — Ambulatory Visit: Payer: Medicaid Other | Admitting: Family Medicine

## 2010-08-20 ENCOUNTER — Encounter: Payer: Self-pay | Admitting: Family Medicine
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
## 2010-09-05 NOTE — Op Note (Signed)
NAME:  Lisa Todd, Lisa Todd                         ACCOUNT NO.:  [id number]   MEDICAL RECORD NO.:  [id number]                   PATIENT TYPE:  OIB   LOCATION:  5014                                 FACILITY:  [facility]   PHYSICIAN:  Elana Alm. Thurston Hole, M.D.              DATE OF BIRTH:  09-11-1955   DATE OF PROCEDURE:  10/30/2002  DATE OF DISCHARGE:                                 OPERATIVE REPORT   PREOPERATIVE DIAGNOSES:  Left knee medial meniscus tear with chondromalacia  and synovitis.   POSTOPERATIVE DIAGNOSES:  1. Left knee lateral meniscus tear with chondromalacia and synovitis.  2. Left knee patellofemoral subluxation.   PROCEDURE:  1. Left knee examination under anesthesia, followed by arthroscopic partial     lateral meniscectomy.  2. Left knee chondroplasty.  3. Left knee lateral retinacular release.   SURGEON:  Elana Alm. Thurston Hole, M.D.   ASSISTANT:  Julien Girt, P.A.   ANESTHESIA:  General.   OPERATIVE TIME:  40 minutes.   COMPLICATIONS:  None.   INDICATIONS FOR PROCEDURE:  The patient is a 55 year old woman who has had  significant left knee pain for the past one or two years, increasing in  nature, with signs and symptoms and an MRI documenting meniscus tear,  chondromalacia, and synovitis with possible patella subluxation.  She has  failed conservative care and is now to undergo an arthroscopy.   DESCRIPTION OF PROCEDURE:  The patient is brought to the operating room on  October 30, 2002, and placed on the operating room table in the supine  position.  After an adequate level of general anesthesia was obtained, her  left knee was examined under anesthesia.  Range of motion from 0-125  degrees, 1-2+ crepitation.  The knee was stable to ligamentous examination  with mild lateral patella tracking.  After this was done, then the knee was  sterilely injected with 0.25% Marcaine with epinephrine.  Her left leg was  prepped using sterile DuraPrep and draped using  sterile technique.  Originally the arthroscopy was performed through an inferolateral portal.  The arthroscope with a pump attached was placed, and through an inferomedial  portal an arthroscopic probe was placed.  On initial inspection of the  medial compartment, she was found to have 50% grade 3 chondromalacia which  was debrided.  The medial meniscus was probed, and this was found to be  normal.  The inner condylar notch was inspected and the posterior cruciate  ligaments were normal.  The lateral compartment was inspected.  She had 25%  grade 3 chondromalacia and she had tearing of the posterior and lateral horn  of the lateral meniscus, of which 25% was resected back to a stable rim.  The patellofemoral joint showed 50% grade 3 chondromalacia on the patella  and femoral groove which was debrided.  Moderate lateral patella tracking  was noted, and thus a lateral retinacular release was carried out with  an  ArthroCare wand, with no excessive bleeding.  This significantly  decompressed the patellofemoral joint and improved patella tracking to  normal.  There was moderate synovitis in the medial and lateral gutters  which was also debrided.  Otherwise the gutters were free of pathology.  After this was done, it was felt that all pathology had been satisfactorily  addressed.  The instruments were removed.  The portals were closed with #3-0  nylon suture and injected with 0.25% Marcaine with epinephrine and 4 mg of  morphine.  Sterile dressings were applied.  The patient was awakened and taken to the recovery room in stable condition.   FOLLOW UP:  The patient will be followed overnight in the hospital for  observation due to her chronic obstructive pulmonary disease, sleep apnea  and diabetes.  She will be followed as well by internal medicine.  She will  be discharged in the next 24 hours, if stable.  She will be seen back in the  office in one week for sutures out and  followup.                                                 Robert A. Thurston Hole, M.D.    RAW/MEDQ  D:  10/30/2002  T:  10/30/2002  Job:  [id number]

## 2010-09-05 NOTE — Procedures (Signed)
   NAME:  Lisa Todd, Lisa Todd                         ACCOUNT NO.:  [id number]   MEDICAL RECORD NO.:  [id number]                   PATIENT TYPE:  OUT   LOCATION:  RAD                                  FACILITY:  [facility]   PHYSICIAN:  Gerrit Friends. Dietrich Pates, M.D. [facility]        DATE OF BIRTH:  09/14/1954   DATE OF PROCEDURE:  12/20/2001  DATE OF DISCHARGE:                                  ECHOCARDIOGRAM   CLINICAL DATA:  Abdominal swelling, hypertension, diabetes, and obesity.  To  evaluate for possible congestive heart failure.   1. Technically adequate echocardiographic study.  2. Normal left atrium, right atrium, and right ventricle.  3. Normal aortic mitral, tricuspid, and pulmonic valves; mild mitral annular     calcification.  4. Normal Doppler study.  5. Normal internal dimension of the left ventricle; very mild left     ventricular hypertrophy with normal regional and global left ventricular     systolic function.                                               Gerrit Friends. Dietrich Pates, M.D. [facility]    RMR/MEDQ  D:  12/20/2001  T:  12/20/2001  Job:  [id number]

## 2010-09-05 NOTE — Discharge Summary (Signed)
NAMESOLEDAD, BUDREAU NO.:  [id number]   MEDICAL RECORD NO.:  [id number]          PATIENT TYPE:  INP   LOCATION:  A222                          FACILITY:  [facility]   PHYSICIAN:  Vania Rea, M.D. DATE OF BIRTH:  May 10, 1955   DATE OF ADMISSION:  07/20/2004  DATE OF DISCHARGE:  04/04/2006LH                                 DISCHARGE SUMMARY   PRIMARY CARE PHYSICIAN:  Milus Mallick. Lodema Hong, M.D.   CARDIOLOGIST:  [facility].   CONSULTS THIS ADMISSION:  1.  Cardiology, [place].  2.  Gastroenterology, R. Roetta Sessions, M.D.   DISCHARGE DIAGNOSES:  1.  Chest pain, myocardial infarction ruled out.  2.  Erosive reflux esophagitis.  3.  Diabetes mellitus, type 2, uncontrolled.  4.  Hypertension, controlled.  5.  Morbid obesity.  6.  Depression.  7.  Questionable bipolar disorder or other mild psychotic disorder.   DISPOSITION:  Discharged to home.   DISCHARGE CONDITION:  Stable.   DISCHARGE MEDICATIONS:  1.  Nexium 40 mg twice daily.  2.  Zocor 40 mg at bedtime.  3.  Lexapro 20 mg daily.  4.  Remeron 30 mg at bedtime.  5.  Cardizem LA 420 mg daily.  6.  Avalide 300/12.5 mg daily.  7.  Norvasc 10 mg daily.  8.  Glucotrol XL 10 mg twice daily.  9.  Vicodin one tablet every four hours when necessary.   SPECIAL INVESTIGATIONS THIS ADMISSION:  Upper endoscopy this morning  revealed erosive reflux esophagitis with a suggestion of gastroparesis,  small hiatal hernia.   HOSPITAL COURSE:  Please refer to admission history and physical dictated  July 20, 2004.  This is a morbidly obese 54 year old African American lady  with a history of hypertension, diabetes, and nonobstructive coronary  disease by cardiac cath in 1999, who gave a history of having had an  respiratory infection about a week prior to her visit for which she had been  treated by her primary care physician, who came to the emergency room  complaining of severe central 10/10 chest pain which was  relieved by  nitroglycerin in the emergency room.  The patient was admitted to rule out  myocardial infarction and had the benefit of a cardiology consult.  Her EKGs  were consistently normal.  Her cardiac enzymes were not suggestive of a  cardiac origin of her pain, and since her pain persisted, the patient had  the benefit of a GI consult.  Upper endoscopy was scheduled this morning and  the results as outlined above.  During the admission history and physical, the patient's speech was markedly  circumstantial and she seemed to have great difficulty focusing on one  subject.  It was felt that she probably had a  low grade psychotic disorder,  possibly bipolar disorder as she is known to be getting treatment for  depression from the mental health department.  The patient also had a history of cocaine use five years ago but on this  visit her urine was negative for cocaine.  The patient denies any recent  cocaine use.  The patient  also has a history of sleep apnea but has not been using her  machine because of malfunction for the past two days.  The patient benefited  from a consult with a respiratory therapist this admission and was assisted  her in fixing her machine.  On admission the patient was unclear which medications she took.  She said  she knew the names but not the doses.  She was started empirically on the  named medications for her diabetes and her high blood pressure but when  these medications were later correlated with a pharmacy list, there were  significant differences.  Her blood pressure remained controlled throughout  the hospital course.   PHYSICAL EXAMINATION:  GENERAL:  This afternoon the patient is alert and  oriented.  VITAL SIGNS:  her temperature is 98.2, pulse 98, respirations 20, blood  pressure 99/52.  It was 124/71 early this morning.  Her fasting blood sugar  was 201.  Her pre-lunch blood sugar was 129.  She did not have breakfast  this morning.  She is  saturating at 97% on room air.  CHEST:  Clear to auscultation bilaterally but she continues to have chest  wall tenderness and epigastric tenderness.  ABDOMEN:  Morbidly obese with no masses.  EXTREMITIES:  She has trace edema bilaterally.   LABS:  On admission her ABG on room air pH was 7.42, pCO2 40, pO2 93,  saturating at 97%.  This morning her CBC is unremarkable.  The white count  is 7.4, hemoglobin is 11.5.  There has been no significant change throughout  this hospital admission.  Her platelets are 184.  Her D-dimmer was 0.42.  Her potassium 4.3, chloride 99, CO2 25, glucose 198, BUN 15, creatinine is  0.8, calcium 8.6.  Liver function test on this admission were unremarkable.  Her hemoglobin A1c was 9.0.  Her cardiac enzymes remained completely normal  throughout her hospital course.  Her fasting lipid panel revealed a serum  cholesterol of 249, triglycerides of 155, HDL 57, LDL 161.  Her TSH was  normal at 0.84.   FOLLOW UP:  1.  The patient is to be followed up by her primary care physician within      one week.  2.  She has been recommended to continue on Nexium twice daily for her GI      problems and is to have a gastric emptying study scheduled as an      outpatient.  She is to follow up with Dr. Jena Gauss for this.  3.  She seems to be very unclear about what medications she is taking.  This      needs to be coordinated with her primary care physician and she may need      some home health assistance with managing her medications.  4.  Her lipid panel is markedly abnormal, and her statins may need to be      adjusted.  This can be left to her primary care physician or her      cardiologist.  5.  Her blood sugar is uncontrolled and again this needs to be titrated as      an outpatient.  She may need Glucophage added to her regimen since this      should also help with weight management.  Consideration should also be     given to using an inhaled steroid to prevent the use of  oral steroids.      LC/MEDQ  D:  07/22/2004  T:  07/22/2004  Job:  [id number]

## 2010-09-05 NOTE — Consult Note (Signed)
NAMECARMIE, LANPHER               ACCOUNT NO.:  [id number]   MEDICAL RECORD NO.:  [id number]          PATIENT TYPE:  INP   LOCATION:  A222                          FACILITY:  [facility]   PHYSICIAN:  R. Roetta Sessions, M.D. DATE OF BIRTH:  Jun 27, 1955   DATE OF CONSULTATION:  07/21/2004  DATE OF DISCHARGE:                                   CONSULTATION   REQUESTING PHYSICIAN:  Dr. Orvan Falconer.   PRIMARY CARE PHYSICIAN:  Dr. Lodema Hong.   REASON FOR CONSULTATION:  Atypical chest pain.   HISTORY OF PRESENT ILLNESS:  Ms. Kropp is a 55 year old African-American  female who reports multiple hospital visits for atypical chest pain.  Approximately three days ago, she was sleeping.  She was awakened with  retrosternal chest pain, which caused her to sit up on the side of the bed.  She also noted some shortness of breath, diaphoresis, and describes a  pinching sensation to her left anterior chest area.  The pain lasted about  10 minutes.  She rated the pain as a 10/10 on a pain scale.  She also took 2  Vicodin, which did help relieve the pain some.  She continues to complain of  daily severe heartburn and indigestion.  She has been tried on multiple  PPIs, including ___________ and Prevacid, more recently.  She has had  chronic GERD for 10 years now.  She is complaining of food getting stuck  and points retrosternally.  She denies any regurgitation.  She denies any  odynophagia.  She denies any abdominal pain.  She typically has 1-3 bowel  movements a day.  She denies any rectal bleeding or melena.  She denies any  diarrhea or constipation.   She has had a cardiac workup, which has been negative thus far.  She had a  chest x-ray on April 2 which revealed cardiomegaly and pulmonary vascular  congestion with mild bibasilar atelectasis.  She also has a history of  asthma and sleep apnea and is on CPAP, although she did not have her machine  during this last episode of atypical chest pain.   PAST  MEDICAL HISTORY:  Significant for asthma.  Cardiac cath in 1999 with a  history of nonobstructive coronary artery disease with normal LV function  and ejection fraction, diabetes mellitus, hyperlipidemia, hypertension,  depression, history of cocaine abuse, peptic ulcer disease, and sleep apnea  on CPAP.  She has also been hospitalized for psychiatric illness.   MEDICATIONS PRIOR TO ADMISSION:  1.  Lasix, unknown dose.  2.  Glucotrol, unknown dose.  3.  Lexapro 20 mg daily.  4.  Norvasc 10 mg daily.  5.  Glucophage 500 mg b.i.d.  6.  Lantus 100 units q.h.s.  7.  Prevacid 30 mg daily.  8.  Zocor, unknown dose, daily.  9.  Cardizem 300 mg daily.   ALLERGIES:  No known drug allergies.   FAMILY HISTORY:  Mother alive with a history of breast carcinoma, diabetes  mellitus, asthma, and COPD.  Father, age 61, with history of colon carcinoma  diagnosed at age 74.  He also has COPD.  She has multiple siblings with a  history significant for diabetes mellitus.   SOCIAL HISTORY:  Ms. Ellenberger has been married for 30 years.  She has three  grown healthy children as well as one stepchild.  She denies any alcohol or  tobacco use.  She is a retired Lawyer.  She does report remote use of cocaine  abuse.   REVIEW OF SYSTEMS:  CONSTITUTIONAL:  Weight is steadily increasing.  She  does report some low-grade temp intermittently as well as some chills.  She  denies any anorexia.  MUSCULOSKELETAL:  She is complaining of body aches and  muscular cramping three days ago prior to the onset of this atypical chest  pain.  CARDIOVASCULAR:  See HPI.  She is complaining of intermittent,  occasional palpitations as well.  PULMONARY:  She does have shortness of  breath on exertion.  See HPI.   PHYSICAL EXAMINATION:  VITAL SIGNS:  Weight 363.2 pounds.  Height 63 inches.  Temp 97, pulse 80, respirations 22, blood pressure 191/55.  GENERAL:  Ms. Granquist is a 55 year old morbidly obese African-American female  who is  alert, oriented, pleasant, cooperative in no acute distress.  HEENT:  _____________.  Oropharynx pink and moist without any lesions.  NECK:  Supple without any mass or thyromegaly.  LUNGS:  Clear to auscultation bilaterally.  HEART:  Regular rate and rhythm.  Normal S1 and S2.  ABDOMEN:  Protuberant with positive bowel sounds x4.  No bruits auscultated.  Abdomen is soft, nontender and nondistended without palpable mass or  hepatosplenomegaly.  No rebound tenderness or guarding.  Negative Murphy's  sign.  The exam is extremely limited, given the patient's body habitus.  EXTREMITIES:  She does have trace bilateral lower extremity edema.  SKIN:  Dry, without any rash or jaundice.   LABORATORY STUDIES:  WBC 7.1, hemoglobin 11.8, hematocrit 34, platelets 180.  D-dimer is negative at 0.42.  Calcium 8.3, sodium 136, potassium 3.7,  chloride 102, CO2 27, BUN 13, creatinine 0.7, glucose 131.  Total bilirubin  0.5, alkaline phosphatase 45, SGOT 14, SGPT 17, total protein 6.3.  Albumin  3.  Urine drug screen was positive for benzodiazepines.   IMPRESSION:  Ms. Hobbins is a 55 year old obese African-American female with  atypical chest pain of unknown etiology.  Cardiac workup thus far has been  negative.  She does have a history of asthma and sleep apnea and is on CPAP;  however, she did not have her CPAP machine with this last episode of  atypical chest pain, where she was awakened at night.  This could be  obstructive sleep apnea.  She also has a history of chronic gastroesophageal  reflux disease, which has been somewhat refractory to therapy versus erosive  reflux esophagitis.  Given her 10-year history of disease with atypical  symptoms, I feel that further evaluation is warranted with EGD at this time.  We should also rule out complications of chronic gastroesophageal reflux  disease, including the development of Barrett's esophagus.   RECOMMENDATIONS:  1.  We will schedule EGD with Dr.  Kendell Bane tomorrow.  Dr. Kendell Bane discussed      this procedure, including risks and benefits, which include but are not      limited to bleeding, infection, perforation, drug reaction.  She agrees      with this plan and consent will be obtained.  2.  She will be n.p.o. past midnight for the procedure.  3.  Agree with Protonix 40 mg daily while  hospitalized.  4.  Further recommendations pending procedure.   We would like to thank Dr. Orvan Falconer for allowing [place] to participate in the  care of Ms. Mcquaig.      KC/MEDQ  D:  07/21/2004  T:  07/21/2004  Job:  [id number]   cc:   Milus Mallick. Lodema Hong, M.D.  [address]  [place], [place] [address]  Fax: [phone]   Vania Rea, M.D.

## 2010-09-05 NOTE — Op Note (Signed)
Lisa Todd, Lisa Todd               ACCOUNT NO.:  [id number]   MEDICAL RECORD NO.:  [id number]          PATIENT TYPE:  INP   LOCATION:  A222                          FACILITY:  [facility]   PHYSICIAN:  R. Roetta Sessions, M.D. DATE OF BIRTH:  [birth year]   DATE OF PROCEDURE:  07/22/2004  DATE OF DISCHARGE:  07/22/2004                                 OPERATIVE REPORT   PROCEDURE:  Esophagogastroduodenoscopy.   INDICATIONS:  The patient is a 55 year old lady with chronic, severe  gastroesophageal reflux disease, recently admitted with atypical chest pain.  An EGD is now being done to further evaluate her symptoms.  This approach  has been discussed with the patient at length.  The potential risks,  benefits and alternatives have been reviewed, questions answered.  Please  see my documentation in the medical record.   DESCRIPTION OF PROCEDURE:  Oxygen saturation, blood pressure, pulse and  respiration were monitored throughout the entire procedure.  Conscious  sedation with Demerol 100 mg IV, Versed 4 mg IV in divided doses.  The  instrument was the Olympus video chip system.   FINDINGS:  Esophagus:  Examination of the tubular esophagus revealed a  wildly patent structure.  There were circumferential distal esophageal  erosions.  No evidence of Barrett's esophagus.  The EG junction was easily  traversed.   Stomach:  The gastric cavity was empty and insufflated well with air.  Examination of the gastric mucosa including the retroflexed view of the  proximal stomach and esophagogastric junction demonstrated only small hiatal  hernia.  Pylorus was patent and easily traversed.  Examination of the bulb  and second portion reveal no abnormalities.   THERAPY AND DIAGNOSTIC MANEUVERS:  None.   The patient tolerated the procedure well and reacted to endoscopy.   IMPRESSION:  1.  Circumferential distal esophageal erosions consistent with erosive      reflux esophagitis.  Otherwise normal  esophagus.  2.  Small hiatal hernia.  Otherwise normal stomach and D1 and D2.   RECOMMENDATIONS:  1.  Nexium 40 mg orally twice daily before breakfast and supper.  2.  Consider an outpatient __________ gastric-emptying study to evaluate      this nice lady further for potential gastroparesis.      RMR/MEDQ  D:  08/03/2004  T:  08/04/2004  Job:  [id number]   cc:   Vania Rea, M.D.

## 2010-09-05 NOTE — Group Therapy Note (Signed)
Lisa Todd, Lisa Todd NO.:  [id number]   MEDICAL RECORD NO.:  [id number]          PATIENT TYPE:  INP   LOCATION:  A222                          FACILITY:  [facility]   PHYSICIAN:  Vania Rea, M.D. DATE OF BIRTH:  1956-04-10   DATE OF PROCEDURE:  07/20/2004  DATE OF DISCHARGE:                                   PROGRESS NOTE   Hospital day two.   SUBJECTIVE:  Patient continues to complain of severe chest pain and coughing  up green sputum.  She remains afebrile.   Review of her chest x-ray revealed no evidence of infection.   OBJECTIVE:  Vital Signs:  Her temperature is 97, pulse 71, respirations 16,  blood pressure 91/44.  Her fasting blood sugar is 131.  She is saturating at  95% on 2 liters.  Chest:  Clear to auscultation bilaterally.  There is no  wheezing.  Gastrointestinal:  She continues to have epigastric and chest  tenderness.  Her abdomen is obese and soft.  Extremities:  She has a trace  edema bilaterally.   LABORATORY:  There are no significant changes on her CBC.  Her white count  remains normal, the hemoglobin remains 11.8.  On her serum chemistry, again,  there are no significant changes except that her glucose is showing as 131.  Her calcium is 8.3.  She has had three sets of cardiac enzymes which are  completely normal.   Her EKG is showing normal sinus rhythm.   A urine drug screen is positive only for benzodiazepines, and her beta type  natriuretic peptide was less than 30.   ASSESSMENT:  1.  Chest pains of unclear etiology.  Patient has had similar chest pains      where a pulmonary embolus was ruled out.  Patient indicates that this      pain was relieved by nitroglycerin which is not typical of a pulmonary      embolism.  EKG is not suggestive of a pulmonary embolus.  She is not      tachycardic.  2.  A history of gastroesophageal reflux disease.  We will continue PPIs.      Overall we will await cardiology evaluation and make a  decision whether      to keep this lady in the hospital or to send her home.  There does seem      to be a significant psychological component to her disorder, although      she seems much more together this morning.     LC/MEDQ  D:  07/21/2004  T:  07/21/2004  Job:  [id number]

## 2010-09-05 NOTE — Discharge Summary (Signed)
[facility]  Patient:    Lisa Todd                       MRN: [id number] Adm. Date:  [id number] Disc. Date: 05/05/99 Attending:  Miguel Todd CC:         Lisa Todd, [address] [place], [place]. 28                           Discharge Summary  DATE OF BIRTH:  July 15, 1955  CONSULTS:  None.  PROCEDURES:  None.  DISCHARGE DIAGNOSES: 1. Atypical pneumonia. 2. Asthma exacerbation. 3. Asthma, untreated, secondary to patient noncompliance. 4. Diabetes mellitus with hyperglycemia exacerbated by steroids, new diagnosis. 5. Sleep apnea. 6. Obesity. 7. History of chest pain with negative catheterization, [facility], within five    years (could not locate record).  DISCHARGE MEDICATIONS: 1. Azithromycin 500 mg 1 p.o. q.d., #8. 2. Prednisone taper 50 mg tapering over eight days. 3. Albuterol nebulizer q.4-6h. p.r.n. 4. Flovent 220 mcg metered dose inhaler 2 puffs b.i.d. 5. Serevent 2 puffs b.i.d. 6. Nasonex spray 2 sprays per nostril b.i.d. for congestion. 7. Patient may continue Maxair inhaler as previously instructed for as-needed use,    to be kept with her at all times.  FOLLOW-UP:  Lisa Todd - January 16, Tuesday, 11 a.m.  Split-night sleep study - April 4, Wednesday.  SUMMARY:  Patient is a 55 year old obese woman with untreated asthma and sleep apnea (secondary to her own noncompliance) who came to the emergency room complaining of cold symptoms beginning approximately three days ago, followed by shortness of breath and cough productive of whitish-yellow and pink-tinged sputum. She had experienced uncomfortable breathing with some chest discomfort in the right greater than left chest.  No fevers or chills.  Some nausea.  Denied prior history of pneumonia, although she is exposed to many small children whom at any time can have any number of viral illnesses.  She worked in [employer] in the [employer] or 26 years without a mask, until about 1993.  She does not take her inhalers as prescribed because she "doesnt need them."  Has had sleep apnea testing per her  report, but did not follow up because she "didnt have a problem."  She has not ad a pneumovax or flu shot, to her knowledge.  She denied GI symptoms, urinary symptoms, weight loss or weight gain, and noted that her periods were regular.   PHYSICAL EXAMINATION:  VITAL SIGNS:  She was afebrile with blood pressure 106/64, pulse 90, saturation 94% on 2 liters when awake, dropped to 88% on 2 liters when asleep.   GENERAL:  She was very obese, although awake, alert, talkative, and tearful because of reported stress in her life at home.  HEENT:  Her eyes were nonicteric without injection.  Her mouth was moist without oropharyngeal lesions.  NECK:  Her neck was thick and no nodes were palpable.  CHEST:  Her large chest revealed nonlabored respirations, and loud crackles and  rhonchi, diffuse, right greater than left, with a few wheezes, and reasonable air flow.  HEART:  Regular rate and rhythm with no murmur.  ABDOMEN:  Obese and nontender with positive bowel sounds.  SKIN:  Warm and dry with no rash.  EXTREMITIES:  With no joint pain and no edema, and pedal pulses present and symmetric.  LABORATORY DATA:  Chest  x-ray revealed diffuse bilateral airspace disease without notable adenopathy, mild cardiomegaly.  Notably, the lungs were not hyperinflated.  White count on admission 15.3, hemoglobin 13.6, platelets 269.  Urinalysis negative except for 100 glucose.  Sodium 135, potassium 4.3, BUN 16, creatinine 0.9, glucose 187.  HOSPITAL COURSE:  Lisa Todd was hospitalized with what appeared to be atypical  pneumonia, asthma exacerbation, and observed somnolence, consistent with sleep apnea.  Lisa Todd was treated empirically with IV Rocefin and azithromyacin after blood and sputum cultures were obtained  (both of which remained negative this hospitalization).  She was placed on bronchodilator nebulized therapy, guaifenesin, and supplemental oxygen, with the addition of IV Solu-Medrol the day following admission for worsened bronchospasm.  Chest x-ray showed early and near-complete improvement, and white count normalized to 7.6.  The day prior to admission, her steroid and antibiotic were changed to  oral, and she has maintained 100% saturation on room air.  She describes that she is feeling better, with decrease in cough productivity, although does still feel some chest tightness, especially with exertion.  Lisa Todd will be discharged to complete her therapy at home, to include antibiotics, as well as enhanced asthma treatments.  I, as well as other physicians, have talked to her extensively about the importance of compliance with these regimens, although she has a history of noncompliance and I remain doubtful of future success.  I have added a long-acting albuterol and will provide for an albuterol nebulizer in the home setting.  Because she has some postnasal drainage which could be exacerbating her bronchospasm, we have added Nasonex nasal spray, which can be used temporarily.  Lisa Todd has significant sleep apnea symptoms, and on discussion by phone with her primary physician, Lisa Todd, this has been of concern for some time, although Lisa Todd has refrained from follow-up.  She will be scheduled for a split-night sleep study at the earliest available appointment, which will be in  early April.  Referral to pulmonologist or ENT as Lisa Todd sees fit may proceed thereafter.  I advised Lisa Todd that the primary treatment will be weight loss, also which will be helpful in the control of her new diagnosis of diabetes.  Lisa Todd was noted to have glucosuria on admission, and she reported that she had been a "borderline diabetic", although had not required  therapy or diet modification in the past.  Hemoglobin A1c reveals that she truly is a diabetic,  with elevated level to 6.9.  There has been exacerbation of her hyperglycemia this  admission, secondary to her steroids.  Because of compliance issues and an already-established relationship with the patient and her primary care doctor,  will not send her home on therapy today, but have arranged for early follow-up n Dr. Luther Parody office tomorrow, at which point further therapy could be discussed. Lisa Todd agrees with local referral to nutritionist in [place] for reinforcement of these issues. DD:  05/05/99 TD:  05/05/99 Job: [id number]

## 2010-09-05 NOTE — H&P (Signed)
Lisa Todd, KILROY NO.:  [id number]   MEDICAL RECORD NO.:  [id number]          PATIENT TYPE:  INP   LOCATION:  A222                          FACILITY:  [facility]   PHYSICIAN:  Vania Rea, M.D. DATE OF BIRTH:  [birth year]   DATE OF ADMISSION:  07/20/2004  DATE OF DISCHARGE:  [facility]                                HISTORY & PHYSICAL   PRIMARY CARE PHYSICIAN:  Dr. Syliva Overman   CHIEF COMPLAINT:  Chest pain for 2 days.   HISTORY OF PRESENT ILLNESS:  This is a morbidly obese African-American lady  with a history of hypertension, diabetes and asthma who was in her baseline  state of health until 2 days ago when she started having a pressing left  chest pain aggravated by lying flat and relieved somewhat by sitting  forward.  It was associated with shortness of breath, diaphoresis and nausea  and a feeling of numbness in both hands and both feet.  Patient says she has  had similar episodes before for which she has come to the emergency room.  She took no medications to attempt to relieve it but note it seemed to get  worse if she ate something.  Eventually patient came to the emergency room  where she received sublingual nitroglycerin which promptly relieved her  pain.   Patient says she visited her primary care physician last week for a cough  productive of green sputum and nasal congestion, was diagnosed with a  possible pneumonia but did not get the chest x-ray which was requested.  She  says she has almost completed a course of antibiotics which was prescribed  and has completed her other medication.   Patient also notes that she visited her psychiatrist 1-2 weeks ago and  received new prescriptions for Remeron and Lexapro but has not yet filled  those prescriptions.  The patient says she is now completely out of her  regular medications and does not have any with her.  She knows the names of  most of her medications but not the doses.   PAST MEDICAL  HISTORY:  1.  Hypertension.  2.  Diabetes.  3.  Hyperlipidemia.  4.  Irregular heartbeat.  5.  Asthma.  6.  History of peptic ulcer disease.  7.  History of sleep apnea on CPAP.  8.  History of cardiac cath in 1999 with nonobstructive coronary disease.   MEDICATIONS:  1.  Lantus 100 units at bedtime.  2.  Glucophage 500 mg twice daily.  3.  Prevacid daily.  4.  Zocor daily.  5.  Cardizem 300 mg daily.  6.  Norvasc 10 mg daily.  7.  Lexapro 20 mg daily.  8.  Glucotrol unknown dose daily.  9.  Lasix unknown dose daily.   ALLERGIES:  No known drug allergies.   SOCIAL HISTORY:  Married with four children.  Unemployed.  Denies tobacco or  alcohol use.  Says she has been clean from crack cocaine for the past 10  years but had one relapse 5 years ago, none since.   REVIEW OF SYSTEMS:  Patient  admits to headaches on and off, recent diagnosis  of respiratory infection, possible pneumonia.  Chronic lower extremity edema  and swelling of hands.  She says she has gained large amounts of weight  since using steroids for asthma but is no longer on steroids.  Says she has  been having loose stools since yesterday, two stools yesterday, three today  watery and green.  No genitourinary problems, no joint problems, no history  of strokes or syncope.   PHYSICAL EXAMINATION:  GENERAL:  This is a morbidly obese African-American  lady lying in bed.  Her speech is extremely circumstantial suggestive of a  borderline psychotic disorder.  VITALS:  Temperature is 97.1, pulse 87, respirations 18, blood pressure  104/60.  She is saturated 100% on 2 L.  HEENT:  Her pupils are round, equal and reactive and mucous membranes are  pink and anicteric and moist.  She has a thick neck with no obvious  lymphadenopathy or thyromegaly.  CHEST:  Clear to auscultation bilaterally.  CARDIOVASCULAR SYSTEM:  Regular.  ABDOMEN:  Soft and nontender.  EXTREMITIES:  She has a trace edema bilaterally.  CNS:  She has  no focal neurologic deficit.   LABORATORIES:  Her white count is 6.6, hemoglobin 11.9, platelets 173, she  has a normal differential.  Her sodium is 130, potassium 3.8, chloride 96,  CO2 25, glucose  , BUN 14, creatinine 0.9.  Liver function tests are  unremarkable.  Point-of-care enzymes are so far negative.   ASSESSMENT:  1.  Chest pain rule out unstable angina.  We will admit this lady on a rule      out myocardial infarction protocol.  2.  Hypertension controlled.  3.  Diabetes type 2 uncontrolled.  4.  Morbid obesity.  5.  Depression.  6.  Probable borderline psychotic disorder.   PLAN:  As per orders.      LC/MEDQ  D:  07/20/2004  T:  07/20/2004  Job:  [id number]

## 2010-09-05 NOTE — Consult Note (Signed)
Lisa Todd, Lisa Todd   MEDICAL RECORD NO.:  Todd          PATIENT TYPE:  INP   LOCATION:  A222                          FACILITY:  [facility]   PHYSICIAN:  Vida Roller, M.D.   DATE OF BIRTH:  [birth year]   DATE OF CONSULTATION:  07/21/2004  DATE OF DISCHARGE:                                   CONSULTATION   PRIMARY CARE PHYSICIAN:  Dr. Syliva Overman.   HISTORY OF PRESENT ILLNESS:  Lisa Todd is a 55 year old woman who I follow  in my clinic who has had multiple evaluations for chest discomfort in the  past.  She has non-cardiac chest pain.  She is relatively non-compliant with  her visits to see me, but when I last saw her back in January 2005, she was  evaluated for discomfort in her chest in the emergency room at [facility] and was discharged.  She has had a heart catheterization, a  myocardial perfusion imagining study, and an echocardiogram in the recent  past, which have all been negative.  She states that a couple of days ago  she went on a long car trip and during that time she had dissembled her C-  PAP machine to take it with her and unfortunately, when she reassembled it,  it ceased to work.  She had significant air leak around the mask.  She has  been sleeping without her C-PAP machine for four or five days now.  She also  was seen by her primary doctor, Dr. Lodema Hong, for a pulmonary infection and  was told to get a chest x-ray but ended up not doing that as well.  She  continues to have a productive cough.  She denies any shortness of breath.  There are no palpitations.  No PND or orthopnea.  No diaphoresis.  She  describes this discomfort as a sharp stinging sensation in her left chest,  which has been constant for about three days.  It does not, however,  interfere with any of her routine daily activities of life.   MEDICATIONS PRIOR TO ADMISSION:  1.  Norvasc 10 mg twice a day.  2.  Lantus insulin 100 units  q.h.s.  3.  She takes Mavik and Cardizem at unknown doses.  My most recent note,      which is a year old, says 2 mg of Mavik once a day and Cardizem 420 mg      once a day.  4.  She also takes Glucophage 500 mg twice a day.  5.  Glucotrol, which she takes at an unknown dose.  6.  Lasix at an unknown dose.  7.  Lexapro 20 mg once a day.  8.  Zocor at an unknown dose as well.   PAST MEDICAL HISTORY:  Significant for:  1.  Multiple evaluations for chest pain.  She had a heart catheterization in      1999, which showed non-obstructive coronary disease with normal LV      function.  She had a Cardiolite stress test in March 2004, which showed  a normal ejection fraction.  No evidence of ischemia.  2.  Diabetes.  3.  Hypertension.  4.  Hyperlipidemia.  5.  She has a significant depressive disorder.  6.  She has been hospitalized in the past for cocaine abuse but tells me she      does not use anymore.  7.  She has also had a psychiatric hospitalization in the not so distant      past.   SOCIAL HISTORY:  She lives in [place] with her husband and her daughter.  She is out of work secondary to her back injury.  She has four children.  She does not smoke, drink, or use elicit substances.   FAMILY HISTORY:  Unknown.  She does have two brothers and four sisters who  are relatively healthy.   REVIEW OF SYSTEMS:  GENERAL:  She denies any fever, chills, sweats, weight  change, or adenopathy.  HEENT:  She denies any headache, sinus discharge,  change in her voice, vertigo, photophobia, or visual or hearing changes.  She does not have any significant dental problems.  She denies any rash.  CARDIOPULMONARY:  Is as per the history and physical.  GENITOURINARY:  She  denies frequency, dysuria, or straining.  NEUROPSYCHIATRIC:  No weakness,  numbness, or mood changes.  She does have a history of depression and  battles this regularly, as well as anxiety.  MUSCULOSKELETAL:  She has lower   extremity pain due to some problems with her knees and her size.  She does  have some lower extremity swelling, as previously described.  GASTROINTESTINAL:  She denies any nausea and vomiting, diarrhea, bright red  blood per rectum, melena, or hematochezia.  She denies any gastroesophageal  reflux symptoms, although she states that some of her symptoms started after  she ate some food at [employer].  She denies any polyuria, polydipsia, heat  or cold intolerance.  SKIN:  There have been no changes in her skin or hair.   PHYSICAL EXAMINATION:  VITAL SIGNS:  She is 363 pounds.  She is 95%  saturation on 2 L of nasal cannula.  Her pulse is 80.  Her respiratory rate  is 20.  Her blood pressure is 91/55.  She is afebrile.  GENERAL:  She is a morbidly obese very pleasant African American female who  is in no apparent distress.  She is alert and oriented x4.  HEENT:  Unremarkable.  NECK:  Supple.  There is no obvious jugular venous distention or carotid  bruits.  She is normocephalic atraumatic.  CHEST:  Clear to auscultation bilaterally.  CARDIOVASCULAR:  Regular.  Heart sounds are distant but no obvious murmur or  bruit.  SKIN:  Without significant lesions.  ABDOMEN:  Morbidly obese but soft.  GENITOURINARY/RECTAL:  Deferred.  MUSCULOSKELETAL:  Deferred.  BREASTS:  Deferred.  EXTREMITIES:  She does have swelling in her lower extremities, the left  greater than right.  NEUROLOGIC:  Nonfocal.   LABORATORY DATA:  A chest x-ray shows cardiomegaly with pulmonary vascular  congestion versus poor technique.  Her electrocardiogram shows a sinus  rhythm at a rate of 73 with normal intervals and normal axis.  No Q waves.  No ST-T wave changes.  No hypertrophy.  Essentially unchanged from an old  one we have from January 2005.  White blood cell count 7.1, H&H of 11 and  34, platelet count of 180.  Sodium 136, potassium 3.7, chloride 102, bicarbonate 27, BUN 13, creatinine 0.7.  Her blood  sugar is  131.  Three sets  of cardiac enzymes are not consistent with acute myocardial infarction.  Her  liver function studies were normal.  Her B-type natriuretic peptide is less  than 30.  Her urine drug screen is positive only for benzodiazepines.  Her  blood gas on room air shows a pH of 7.42, PCO2 of 71, PO2 of 93, and 97%  saturation.   IMPRESSION:  This is a woman with chest pain, which is very typical for  coronary disease.  She has multiple cardiac risk factors.  She has also had  an extensive evaluation for the chest pain in the past.  I very much doubt  that this is coronary disease.   Due to her recent trip, lower extremity edema, and morbid obesity,  I think  it is probably reasonable to check a D-dimer, although with her blood gas  being as normal as it was on room air, the likelihood she has a pulmonary  embolus is low.   She does have obstructive sleep apnea and I think we can sort of tie the  symptoms that she is having to the dysfunction of her C-PAP machine, so it  would, I think, useful to make sure that respiratory has looked at her C-PAP  machine to make sure it functions normally.   Other than that I have no further recommendations.   I would be happy to see her back at a relatively short interval after her  discharge for outpatient evaluation.  Obviously if her discomfort continues  to be a problem, we can consider doing an inpatient perfusion study but my  sense of things is that it is unlikely to yield much.   We will follow her with you.      JH/MEDQ  D:  07/21/2004  T:  07/21/2004  Job:  [id number]

## 2010-09-05 NOTE — Procedures (Signed)
NAME:  Lisa Todd, RUDY NO.:  [id number]   MEDICAL RECORD NO.:  [id number]          PATIENT TYPE:  REC   LOCATION:  RAD                           FACILITY:  [facility]   PHYSICIAN:  Vida Roller, M.D.   DATE OF BIRTH:  [birth year]   DATE OF PROCEDURE:  12/30/2004  DATE OF DISCHARGE:                                    STRESS TEST   HISTORY OF PRESENT ILLNESS:  The patient is a 55 year old African American  female with atypical chest pain, hypertension.  Had a previous cath in 1999  which showed nonobstructive coronary artery disease, mild LVEF of 50%,  diabetes mellitus.  Second test performed today was Adenosine Myoview with  60 __________density view.  PA performing the test was April Humphrey, NP.   INDICATIONS:  Chest pain.   SYMPTOMS DURING THE TEST:  During the  procedure the patient experienced  some chest pressure which was relieved after the test was completed  approximately 1-2 minutes.  Arrhythmias:  No arrhythmias were noted.  _________changes were noted during the procedure.  The reason for stopping  was the test was completed.  Resting heart rate was 82.  Resting blood  pressure was 112/68.  Target heart rate was 146. Max predicted heart rate  was 172. Percent of max heart rate was 65.  Max heart rate during procedure  was 111.  Systolic blood pressure was 118/72, total time was 4 minutes and  13 seconds with Adenosine.  Worst case ST slope was 0.  Worst case ST level  was -0.2.  Patient completed procedure without complications.     ______________________________  April Humphrey, NP      Vida Roller, M.D.  Electronically Signed    AH/MEDQ  D:  12/30/2004  T:  12/30/2004  Job:  [id number]

## 2010-09-05 NOTE — Op Note (Signed)
Lisa Todd, Lisa Todd               ACCOUNT NO.:  [id number]   MEDICAL RECORD NO.:  [id number]          PATIENT TYPE:  OIB   LOCATION:  2550                         FACILITY:  [facility]   PHYSICIAN:  Robert A. Thurston Hole, M.D. DATE OF BIRTH:  08-08-1955   DATE OF PROCEDURE:  08/03/2005  DATE OF DISCHARGE:                                 OPERATIVE REPORT   PREOPERATIVE DIAGNOSES:  1.  Right knee medial lateral meniscal tears with chondromalacia and      synovitis.  2.  Right knee lateral patellar tracking.   POSTOPERATIVE DIAGNOSES:  1.  Right knee medial lateral meniscal tears with chondromalacia and      synovitis.  2.  Right knee lateral patellar tracking.   PROCEDURES:  1.  Right knee examination under anesthesia, followed by arthroscopic      partial medial and lateral meniscectomies.  2.  Right knee chondroplasty with partial synovectomy.  3.  Right knee lateral retinacular release.   SURGEON:  Elana Alm. Thurston Hole, M.D.   ASSISTANT:  Julien Girt, P.A.   ANESTHESIA:  General.   OPERATIVE TIME:  30 minutes.   COMPLICATIONS:  None.   INDICATIONS FOR PROCEDURE:  Lisa Todd is a morbidly obese 55 year old woman  with multiple medical problems, who has had significant persistent right  knee pain with exam and MRI documenting meniscal tearing with  chondromalacia, synovitis and lateral patellar subluxation, who has failed  conservative care and is now to undergo arthroscopy.   DESCRIPTION:  Lisa Todd was brought to the operating room on August 03, 2005, placed on the operative table in supine position.  After an adequate  of level general anesthesia was obtained, her right knee was examined.  She  had range of motion from 0-120 degrees, knee stable to ligamentous exam with  lateral patellar tracking.  The knee was sterilely injected with 0.25%  Marcaine with epinephrine. The right leg was then prepped using sterile  DuraPrep and draped using sterile technique.  Originally  through an  anterolateral portal the arthroscope with a pump attached was placed, and  through an anteromedial portal an arthroscopic probe was placed.  On initial  inspection of the medial compartment, she was found to 50% grade 3  chondromalacia, which was debrided.  Medial meniscus tear, posterior and  medial horn, of which 25-30% was resected back to a stable rim.  The  intercondylar notch inspected.  Anterior and posterior cruciate ligaments  were normal.  Lateral compartment inspected.  She had 50% grade 3  chondromalacia on the lateral femoral condyle, which was debrided, grade 1  and 2 changes, lateral tibial plateau.  Lateral meniscus tear of posterior  and lateral horn, 25-30%, was resected back to a stable rim.  The  patellofemoral joint showed 75-80% grade 3 chondromalacia on the patella,  which was debrided.  Thirty percent grade 3 changes, femoral groove, which  was debrided.  Lateral patellar tracking was noted.  Moderate synovitis in  all gutters was debrided.  Lateral retinacular release was carried out with  an ArthroCare wand with no excessive bleeding, decompressing the  patellofemoral joint and improving patellar tracking.  After this was done,  it was felt that all pathology had been satisfactorily addressed.  The  instruments were removed.  Portals closed with 3-0 nylon suture and injected  with 0.25% Marcaine with epinephrine and 4 mg of morphine.  A sterile  dressing was applied and the patient awakened and taken to the recovery room  in stable condition.   FOLLOW-UP CARE:  Lisa Todd will be followed overnight for observation due  to her multiple medical problems.  She will be discharged tomorrow if  stable.  See me back in the office in a week for sutures out and follow-up.      Robert A. Thurston Hole, M.D.  Electronically Signed     RAW/MEDQ  D:  08/03/2005  T:  08/03/2005  Job:  [id number]

## 2010-09-05 NOTE — Procedures (Signed)
NAMESAYWARD, HORVATH NO.:  [id number]   MEDICAL RECORD NO.:  [id number]          PATIENT TYPE:  INP   LOCATION:  A222                          FACILITY:  [facility]   PHYSICIAN:  Vida Roller, M.D.   DATE OF BIRTH:  1955/11/14   DATE OF PROCEDURE:  07/21/2004  DATE OF DISCHARGE:                                  ECHOCARDIOGRAM   HISTORY OF PRESENT ILLNESS:  Ms. Glandon is a 55 year old woman who is  morbidly obese and comes in with chest discomfort.  She has a history of  noncardiac chest pain.  She had an echocardiogram back in 2003, which was  technically difficult but adequate.  It showed normal left ventricular  systolic function, no evidence of significant valvular heart disease, mild  left ventricular hypertrophy.  Today's study is equally technically  difficult.  M-mode tracings are likely inaccurate.   2-D AND DOPPLER IMAGING:  The left ventricle is normal size with normal  systolic function.  Estimated ejection fraction of 65% to 70%.  There were  no obvious wall motion abnormalities.  There is very mild left ventricular  hypertrophy.   The right ventricle appears to be normal size with normal systolic function.   Both atria appear to be normal size.   There is no pericardial effusion.   There does not appear to be any significant valvular heart disease, although  assessment for this was limited, due to the patient's body habitus.      JH/MEDQ  D:  07/21/2004  T:  07/21/2004  Job:  [id number]

## 2010-09-05 NOTE — Procedures (Signed)
   NAME:  Lisa Todd, Lisa Todd NO.:  [id number]   MEDICAL RECORD NO.:  [id number]                   PATIENT TYPE:  OUT   LOCATION:  RAD                                  FACILITY:  [facility]   PHYSICIAN:  Vida Roller, M.D.                DATE OF BIRTH:  [birth year]   DATE OF PROCEDURE:  06/30/2002  DATE OF DISCHARGE:                                    STRESS TEST   INDICATION:  [name] is a 55 year old female with nonobstructive coronary  artery disease by catheterization in 1999.  She had an ejection fraction of  50% at that time.  She now presents with a longstanding history of atypical  chest discomfort.  Her cardiac risk factors include diabetes, hypertension,  family history, and obesity.   BASELINE DATA:  EKG shows sinus rhythm at 76 beats per minute with  nonspecific ST abnormalities.  Blood pressure was 128/68.   PROCEDURE:  Adenosine 60 mg was infused over four minutes with Cardiolite  injected at three minutes.  The patient reported flushing and shortness of  breath, which resolved in recovery.  No arrhythmias or ischemic changes were  noted on EKG.   Final images and results are pending M.D. review.     Amy Mercy Riding, P.A. [facility]                     Vida Roller, M.D.    AB/MEDQ  D:  06/30/2002  T:  07/01/2002  Job:  [id number]
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
## 2010-09-08 ENCOUNTER — Encounter: Payer: Self-pay | Admitting: Family Medicine

## 2010-09-11 ENCOUNTER — Encounter: Payer: Self-pay | Admitting: Family Medicine

## 2010-09-11 ENCOUNTER — Other Ambulatory Visit: Payer: Self-pay | Admitting: Family Medicine

## 2010-09-11 ENCOUNTER — Ambulatory Visit (INDEPENDENT_AMBULATORY_CARE_PROVIDER_SITE_OTHER): Payer: Medicaid Other | Admitting: Family Medicine

## 2010-09-11 DIAGNOSIS — I10 Essential (primary) hypertension: Secondary | ICD-10-CM

## 2010-09-11 DIAGNOSIS — E669 Obesity, unspecified: Secondary | ICD-10-CM

## 2010-09-11 DIAGNOSIS — Z23 Encounter for immunization: Secondary | ICD-10-CM

## 2010-09-11 DIAGNOSIS — Z139 Encounter for screening, unspecified: Secondary | ICD-10-CM

## 2010-09-11 DIAGNOSIS — E785 Hyperlipidemia, unspecified: Secondary | ICD-10-CM

## 2010-09-11 DIAGNOSIS — E109 Type 1 diabetes mellitus without complications: Secondary | ICD-10-CM

## 2010-09-11 MED ORDER — ESOMEPRAZOLE MAGNESIUM 40 MG PO CPDR: 40.0000 mg | DELAYED_RELEASE_CAPSULE | Freq: Every day | ORAL | Status: DC

## 2010-09-11 NOTE — Progress Notes (Signed)
  Subjective:    Patient ID: Lisa Todd, female    DOB: 05/09/1955, 55 y.o.   MRN: [id number]  HPI HYPERTENSION Disease Monitoring Blood pressure range-unknown Chest pain- no      Dyspnea- no Medications Compliance- fair Lightheadedness- no   Edema- no   DIABETES Disease Monitoring Blood Sugar ranges-unknown, not testing regularly Polyuria- no New Visual problems- no Medications Compliance- poor Hypoglycemic symptoms- no   HYPERLIPIDEMIA Disease Monitoring See symptoms for Hypertension Medications Compliance- uncertainRUQ pain- no  Muscle aches- no Pt is here for f/u. She has missed several appts  And reports poor health in her family as a reason. She understands clearly that her repeated no shows will result in termination, and she wil lneed to have a discussion with the office manager about this    Review of Systems Denies recent fever or chills. Denies sinus pressure, nasal congestion, ear pain or sore throat. Denies chest congestion, productive cough or wheezing. Denies chest pains, palpitations,  and leg swelling Denies abdominal pain, nausea, vomiting,diarrhea or constipation.  Denies rectal bleeding or change in bowel movement. Denies dysuria, frequency, hesitancy or incontinence. Chronic  joint pain, Has had surgery in the past Denies headaches, seizure, numbness, or tingling. Reports depression however states she is ready to take better control of her life and health, wants to improve both . Still has anxiety, denies insomnia Denies skin break down or rash.        Objective:   Physical Exam Patient alert and oriented and in no Cardiopulmonary distress.  HEENT: No facial asymmetry, EOMI, no sinus tenderness,  Oropharynx moist.  Neck supple no adenopathy.  Chest: Clear to auscultation bilaterally.  CVS: S1, S2 no murmurs, no S3.  ABD: Soft non tender. Bowel sounds normal.  Ext: No edema  Skin: Intact, no ulcerations or rash noted.  Psych: Good  eye contact, normal affect. Memory intact not anxious or depressed appearing.  CNS: CN 2-12 intact, power, normal throughout.        Assessment & Plan:

## 2010-09-11 NOTE — Patient Instructions (Addendum)
You need to get fasting labs as soon as possible.  It is important that you exercise regularly at least 30 minutes 5 times a week. If you develop chest pain, have severe difficulty breathing, or feel very tired, stop exercising immediately and seek medical attention   A healthy diet is rich in fruit, vegetables and whole grains. Poultry fish, nuts and beans are a healthy choice for protein rather then red meat. A low sodium diet and drinking 64 ounces of water daily is generally recommended. Oils and sweet should be limited. Carbohydrates especially for those who are diabetic or overweight, should be limited to 34-45 gram per meal. It is important to eat on a regular schedule, at least 3 times daily. Snacks should be primarily fruits, vegetables or nuts.  CMP and EGFR , HBA1C, lipids past due  I will write a letter of  support from the [facility]  You need to reschedule mammogram and keep your appt.

## 2010-09-17 LAB — LIPID PANEL
Cholesterol: 214 mg/dL — ABNORMAL HIGH (ref 0–200)
LDL Cholesterol: 129 mg/dL — ABNORMAL HIGH (ref 0–99)
Total CHOL/HDL Ratio: 3.2 Ratio
VLDL: 19 mg/dL (ref 0–40)

## 2010-09-17 LAB — COMPLETE METABOLIC PANEL WITH GFR
ALT: 16 U/L (ref 0–35)
AST: 14 U/L (ref 0–37)
Albumin: 3.6 g/dL (ref 3.5–5.2)
Alkaline Phosphatase: 46 U/L (ref 39–117)
BUN: 13 mg/dL (ref 6–23)
Potassium: 4 mEq/L (ref 3.5–5.3)
Sodium: 141 mEq/L (ref 135–145)

## 2010-09-17 LAB — HEMOGLOBIN A1C: Mean Plasma Glucose: 183 mg/dL — ABNORMAL HIGH (ref <117)

## 2010-09-18 NOTE — Assessment & Plan Note (Signed)
Controlled, no change in medication  

## 2010-09-18 NOTE — Assessment & Plan Note (Signed)
Uncertain of control await lab result to determine same, importance of compliance with diet , med and physical activity stressed

## 2010-09-18 NOTE — Assessment & Plan Note (Signed)
Slight improvement, pt needs to commit to regular exercise to assist with weight loss

## 2010-09-18 NOTE — Assessment & Plan Note (Signed)
Hyperlipidemia:Low fat diet discussed and encouraged.  Control will be determined when lab data is available.

## 2010-09-22 ENCOUNTER — Ambulatory Visit (HOSPITAL_COMMUNITY): Payer: Medicaid Other

## 2010-09-29 ENCOUNTER — Ambulatory Visit (HOSPITAL_COMMUNITY)
Admission: RE | Admit: 2010-09-29 | Discharge: 2010-09-29 | Disposition: A | Discharge status: Home / Self Care | Payer: Medicaid Other | Source: Ambulatory Visit | Attending: Family Medicine | Admitting: Family Medicine

## 2010-09-29 DIAGNOSIS — Z1231 Encounter for screening mammogram for malignant neoplasm of breast: Secondary | ICD-10-CM | POA: Insufficient documentation

## 2010-09-29 DIAGNOSIS — Z139 Encounter for screening, unspecified: Secondary | ICD-10-CM

## 2010-10-15 ENCOUNTER — Other Ambulatory Visit: Payer: Self-pay | Admitting: Family Medicine

## 2010-10-15 ENCOUNTER — Telehealth: Payer: Self-pay | Admitting: Family Medicine

## 2010-10-16 MED ORDER — PHENTERMINE HCL 37.5 MG PO CAPS: ORAL_CAPSULE | ORAL | Status: DC

## 2010-10-16 NOTE — Telephone Encounter (Signed)
Sent in

## 2010-10-16 NOTE — Telephone Encounter (Signed)
Not on her list. Ok to send?

## 2010-10-16 NOTE — Telephone Encounter (Signed)
pls send phentermine 37.5 mg one daily #30 refill 2 to [facility]

## 2010-11-05 ENCOUNTER — Telehealth: Payer: Self-pay | Admitting: Family Medicine

## 2010-11-05 NOTE — Telephone Encounter (Signed)
Noted and agreed.

## 2010-11-12 ENCOUNTER — Encounter: Payer: Self-pay | Admitting: Family Medicine

## 2010-11-14 ENCOUNTER — Encounter: Payer: Self-pay | Admitting: Family Medicine

## 2010-11-17 ENCOUNTER — Encounter: Payer: Self-pay | Admitting: Family Medicine

## 2010-11-17 ENCOUNTER — Ambulatory Visit (INDEPENDENT_AMBULATORY_CARE_PROVIDER_SITE_OTHER): Payer: Medicaid Other | Admitting: Family Medicine

## 2010-11-17 VITALS — BP 160/90 | HR 71 | Resp 16 | Ht 63.0 in | Wt 265.1 lb

## 2010-11-17 DIAGNOSIS — E785 Hyperlipidemia, unspecified: Secondary | ICD-10-CM

## 2010-11-17 DIAGNOSIS — J45909 Unspecified asthma, uncomplicated: Secondary | ICD-10-CM

## 2010-11-17 DIAGNOSIS — E109 Type 1 diabetes mellitus without complications: Secondary | ICD-10-CM

## 2010-11-17 DIAGNOSIS — R5381 Other malaise: Secondary | ICD-10-CM

## 2010-11-17 DIAGNOSIS — E669 Obesity, unspecified: Secondary | ICD-10-CM

## 2010-11-17 DIAGNOSIS — I10 Essential (primary) hypertension: Secondary | ICD-10-CM

## 2010-11-17 DIAGNOSIS — R5383 Other fatigue: Secondary | ICD-10-CM

## 2010-11-17 MED ORDER — ALPRAZOLAM 1 MG PO TABS: 1.0000 mg | ORAL_TABLET | Freq: Every evening | ORAL | Status: DC | PRN

## 2010-11-17 MED ORDER — PHENTERMINE HCL 37.5 MG PO CAPS: ORAL_CAPSULE | ORAL | Status: DC

## 2010-11-17 NOTE — Patient Instructions (Signed)
CPE  The week of August 28  LABWORK  NEEDS TO BE DONE BETWEEN 3 TO 7 DAYS BEFORE YOUR NEXT SCEDULED  VISIT.  THIS WILL IMPROVE THE QUALITY OF YOUR CARE.   You will be referred to Dr fields  For a screening colonscopy, and toDr Nile Riggs for diabetic eye exam.  You will receive paperwork to call to attend grp session for diabetics, and will also be referred for individual counselling for diabetes and obesity.   IT is VITAL that you keep all appts.

## 2010-11-19 ENCOUNTER — Encounter: Payer: Self-pay | Admitting: Family Medicine

## 2010-11-19 NOTE — Assessment & Plan Note (Signed)
Stable at this time 

## 2010-11-19 NOTE — Assessment & Plan Note (Signed)
Deteriorated. Patient re-educated about  the importance of commitment to a  minimum of 150 minutes of exercise per week. The importance of healthy food choices with portion control discussed. Encouraged to start a food diary, count calories and to consider  joining a support group. Sample diet sheets offered. Goals set by the patient for the next several months.    

## 2010-11-19 NOTE — Assessment & Plan Note (Signed)
Uncontrolled, rept labs due prior o next visit. Low fat diet discussed and encouraged

## 2010-11-19 NOTE — Progress Notes (Signed)
  Subjective:    Patient ID: Lisa Todd, female    DOB: 05/14/1955, 55 y.o.   MRN: [id number]  HPI HYPERTENSION  Disease Monitoring  Blood pressure range-unknown Chest pain- no      Dyspnea- no  Compliance- poor Lightheadedness- no   Edema- no  DIABETES  Disease Monitoring  Blood Sugar ranges-does  Not test regularly Polyuria- no New Visual problems- no  Compliance- poor Hypoglycemic symptoms- no  Last eye exam: 2011      Podiatry visit: no  HYPERLIPIDEMIA  Compliance- fair RUQ pain- no  Muscle aches- no   REGULAR EXERCISE-no Pt in for f/u. States she has been under increased financial stress and has not been compliant with diet or exercise States she intends to attempt to  Get the bypass surgery once more and is therefore intending to keep monthly office visits and work diligently on weight loss. C/o painful swelling on the top of her foot , which has decreased in size , no redness , drainage or warmth  Review of Systems See HPI Denies recent fever or chills. Denies sinus pressure, nasal congestion, ear pain or sore throat. Denies chest congestion, productive cough or wheezing. Denies chest pains, palpitations and leg swelling Denies abdominal pain, nausea, vomiting,diarrhea or constipation.   Denies dysuria, frequency, hesitancy or incontinence. Chronic back pain Denies headaches, seizures, numbness, or tingling. Increased depression, anxiety and  Insomnia.not suicidal or homicidal Denies skin break down or rash.       Objective:   Physical Exam Patient alert and oriented and in no cardiopulmonary distress.  HEENT: No facial asymmetry, EOMI, no sinus tenderness,  oropharynx pink and moist.  Neck supple no adenopathy.  Chest: Clear to auscultation bilaterally.  CVS: S1, S2 no murmurs, no S3.  ABD: Soft non tender. Bowel sounds normal.  Ext: No edema  MS: Adequate ROM spine, shoulders, hips and knees.  Skin: Intact, no ulcerations or rash  noted.Ganglion cyst on foot  Psych: Good eye contact, normal affect. Memory intact not anxious , mildly  depressed appearing.  CNS: CN 2-12 intact, power, tone and sensation normal throughout. Diabetic Foot Check:  Appearance - calluses Skin - no unusual pallor or redness Sensation - grossly intact to light touch Monofilament testing -  Right - Great toe, medial, central, lateral ball and posterior foot diminished Left - Great toe, medial, central, lateral ball and posterior foot diminished Pulses Left - Dorsalis Pedis and Posterior Tibia normal Right - Dorsalis Pedis and Posterior Tibia normal         Assessment & Plan:

## 2010-11-19 NOTE — Assessment & Plan Note (Signed)
Uncontrolled, pt non compliant with medication, importance of same stressed ,  No med change

## 2010-11-19 NOTE — Assessment & Plan Note (Signed)
Uncontrolled, pt advised of the necessity to follow diet and test regularly

## 2010-11-21 ENCOUNTER — Encounter: Payer: Self-pay | Admitting: Gastroenterology

## 2010-12-01 ENCOUNTER — Ambulatory Visit: Payer: Medicaid Other | Admitting: Urgent Care

## 2010-12-03 ENCOUNTER — Telehealth: Payer: Self-pay | Admitting: Urgent Care

## 2010-12-03 NOTE — Telephone Encounter (Signed)
OK 

## 2010-12-16 ENCOUNTER — Encounter: Payer: Self-pay | Admitting: Family Medicine

## 2010-12-17 ENCOUNTER — Ambulatory Visit (INDEPENDENT_AMBULATORY_CARE_PROVIDER_SITE_OTHER): Payer: Medicaid Other | Admitting: Family Medicine

## 2010-12-17 ENCOUNTER — Other Ambulatory Visit: Payer: Self-pay | Admitting: Family Medicine

## 2010-12-17 ENCOUNTER — Encounter: Payer: Self-pay | Admitting: Family Medicine

## 2010-12-17 VITALS — BP 130/80 | HR 83 | Resp 16 | Ht 63.0 in | Wt 270.0 lb

## 2010-12-17 DIAGNOSIS — F419 Anxiety disorder, unspecified: Secondary | ICD-10-CM

## 2010-12-17 DIAGNOSIS — J45909 Unspecified asthma, uncomplicated: Secondary | ICD-10-CM

## 2010-12-17 DIAGNOSIS — E109 Type 1 diabetes mellitus without complications: Secondary | ICD-10-CM

## 2010-12-17 DIAGNOSIS — E669 Obesity, unspecified: Secondary | ICD-10-CM

## 2010-12-17 DIAGNOSIS — I10 Essential (primary) hypertension: Secondary | ICD-10-CM

## 2010-12-17 DIAGNOSIS — F411 Generalized anxiety disorder: Secondary | ICD-10-CM

## 2010-12-17 DIAGNOSIS — M25569 Pain in unspecified knee: Secondary | ICD-10-CM

## 2010-12-17 DIAGNOSIS — F329 Major depressive disorder, single episode, unspecified: Secondary | ICD-10-CM

## 2010-12-17 DIAGNOSIS — M1990 Unspecified osteoarthritis, unspecified site: Secondary | ICD-10-CM

## 2010-12-17 DIAGNOSIS — M129 Arthropathy, unspecified: Secondary | ICD-10-CM

## 2010-12-17 DIAGNOSIS — E119 Type 2 diabetes mellitus without complications: Secondary | ICD-10-CM

## 2010-12-17 DIAGNOSIS — E785 Hyperlipidemia, unspecified: Secondary | ICD-10-CM

## 2010-12-17 MED ORDER — ALPRAZOLAM 1 MG PO TABS: ORAL_TABLET | ORAL | Status: DC

## 2010-12-17 NOTE — Patient Instructions (Addendum)
F/u in 4 weeks.  Pls get fasting labs tomorrow .  Change your eating habits and start water exercises as recommended by orthopedics  I hope that your nerves improve soon, and that the pain in your knees lessens, it will as you lose weight  Dose increase on the xanax effective today, two daily

## 2010-12-19 NOTE — Assessment & Plan Note (Signed)
Increased, continues to c/o lack of any finances not suicidal, homicidal or hallucinating, refusing antidepressant

## 2010-12-19 NOTE — Assessment & Plan Note (Signed)
Uncontrolled, excessive weigh gain and irregular testing

## 2010-12-19 NOTE — Assessment & Plan Note (Signed)
Deteriorated. Patient re-educated about  the importance of commitment to a  minimum of 150 minutes of exercise per week. The importance of healthy food choices with portion control discussed. Encouraged to start a food diary, count calories and to consider  joining a support group. Sample diet sheets offered. Goals set by the patient for the next several months.    

## 2010-12-19 NOTE — Assessment & Plan Note (Signed)
Increased and uncontrolled, increase xanax dose

## 2010-12-19 NOTE — Assessment & Plan Note (Signed)
Increased, recently ha injections an states she has been told may need right knee replacement which she is refusing to consider at this time

## 2010-12-19 NOTE — Assessment & Plan Note (Signed)
Controlled, no change in medication  

## 2010-12-19 NOTE — Assessment & Plan Note (Signed)
Labs to determine control are past due

## 2010-12-19 NOTE — Progress Notes (Signed)
  Subjective:    Patient ID: Lisa Todd, female    DOB: 06-Dec-1955, 55 y.o.   MRN: [id number]  HPI The PT is here for follow up and re-evaluation of chronic medical conditions, medication management and review of any available recent lab and radiology data.  Preventive health is updated, specifically  Cancer screening and Immunization.   Questions or concerns regarding consultations or procedures which the PT has had in the interim are  addressed. The PT denies any adverse reactions to current medications since the last visit.  Pt extremely anxious and agitated at the visit. Frustrated with lack of finances, weight gain and increased knee pain Review of Systems See HPI Denies recent fever or chills. Denies sinus pressure, nasal congestion, ear pain or sore throat. Denies chest congestion, productive cough or wheezing. Denies chest pains, palpitations and leg swelling Denies abdominal pain, nausea, vomiting,diarrhea or constipation.   Denies dysuria, frequency, hesitancy or incontinence. Denies headaches, seizures, numbness, or tingling. Increased depression and anxiety, denies suicidal or homicidal ideation, denies hallucination Denies skin break down or rash.        Objective:   Physical Exam  Patient alert and oriented and in no cardiopulmonary distress.Anxious, tearful, angry  HEENT: No facial asymmetry, EOMI, no sinus tenderness,  oropharynx pink and moist.  Neck supple no adenopathy.  Chest: Clear to auscultation bilaterally.  CVS: S1, S2 no murmurs, no S3.  ABD: Soft non tender. Bowel sounds normal.  Ext: No edema  MS: Adequate ROM spine, shoulders, hips and reduced in  knees.  Skin: Intact, no ulcerations or rash noted.  Psych: . Memory intact both anxious and  depressed appearing.  CNS: CN 2-12 intact, power,normal throughout.       Assessment & Plan:

## 2010-12-24 ENCOUNTER — Encounter: Payer: Self-pay | Admitting: Family Medicine

## 2010-12-25 ENCOUNTER — Ambulatory Visit: Payer: Medicaid Other | Admitting: Family Medicine

## 2011-01-09 LAB — BASIC METABOLIC PANEL
CO2: 27
Calcium: 8.3 — ABNORMAL LOW
Chloride: 107
Creatinine, Ser: 0.92
GFR calc Af Amer: >60
Sodium: 137

## 2011-01-09 LAB — DIFFERENTIAL
Basophils Relative: 0
Eosinophils Relative: 1
Lymphocytes Relative: 20
Neutro Abs: 5.6
Neutrophils Relative %: 72

## 2011-01-09 LAB — CBC
Hemoglobin: 11.6 — ABNORMAL LOW
MCHC: 34.1
MCV: 86.3
RBC: 3.97
WBC: 7.9

## 2011-01-12 ENCOUNTER — Telehealth: Payer: Self-pay | Admitting: Family Medicine

## 2011-01-13 MED ORDER — PHENTERMINE HCL 37.5 MG PO CAPS: ORAL_CAPSULE | ORAL | Status: DC

## 2011-01-13 NOTE — Telephone Encounter (Signed)
1 month only sent to [facility]

## 2011-01-15 ENCOUNTER — Encounter: Payer: Self-pay | Admitting: Family Medicine

## 2011-01-15 LAB — URINALYSIS, ROUTINE W REFLEX MICROSCOPIC
Bilirubin Urine: NEGATIVE
Glucose, UA: NEGATIVE
Hgb urine dipstick: NEGATIVE
Specific Gravity, Urine: 1.015

## 2011-01-16 ENCOUNTER — Ambulatory Visit: Payer: Medicaid Other | Admitting: Family Medicine

## 2011-01-23 LAB — RAPID STREP SCREEN (MED CTR MEBANE ONLY): Streptococcus, Group A Screen (Direct): POSITIVE — AB

## 2011-01-27 ENCOUNTER — Encounter: Payer: Self-pay | Admitting: Family Medicine

## 2011-01-27 ENCOUNTER — Ambulatory Visit: Payer: Medicaid Other | Admitting: Family Medicine

## 2011-01-30 ENCOUNTER — Ambulatory Visit: Payer: Medicaid Other | Admitting: Family Medicine

## 2011-01-30 ENCOUNTER — Encounter: Payer: Self-pay | Admitting: Family Medicine

## 2011-02-09 ENCOUNTER — Telehealth: Payer: Self-pay | Admitting: Family Medicine

## 2011-02-09 ENCOUNTER — Other Ambulatory Visit: Payer: Self-pay

## 2011-02-09 ENCOUNTER — Other Ambulatory Visit: Payer: Self-pay | Admitting: Family Medicine

## 2011-02-09 MED ORDER — PHENTERMINE HCL 37.5 MG PO CAPS: ORAL_CAPSULE | ORAL | Status: DC

## 2011-02-09 NOTE — Telephone Encounter (Signed)
pls refill 30 day supply only

## 2011-02-10 NOTE — Telephone Encounter (Signed)
Refilled and gave to patient

## 2011-02-13 ENCOUNTER — Encounter: Payer: Self-pay | Admitting: Family Medicine

## 2011-02-18 ENCOUNTER — Encounter: Payer: Self-pay | Admitting: Family Medicine

## 2011-02-18 ENCOUNTER — Other Ambulatory Visit: Payer: Self-pay | Admitting: Family Medicine

## 2011-02-18 ENCOUNTER — Ambulatory Visit (INDEPENDENT_AMBULATORY_CARE_PROVIDER_SITE_OTHER): Payer: Medicaid Other | Admitting: Family Medicine

## 2011-02-18 VITALS — BP 130/82 | HR 101 | Resp 16 | Ht 63.0 in | Wt 268.0 lb

## 2011-02-18 DIAGNOSIS — F411 Generalized anxiety disorder: Secondary | ICD-10-CM

## 2011-02-18 DIAGNOSIS — E559 Vitamin D deficiency, unspecified: Secondary | ICD-10-CM | POA: Insufficient documentation

## 2011-02-18 DIAGNOSIS — I10 Essential (primary) hypertension: Secondary | ICD-10-CM

## 2011-02-18 DIAGNOSIS — R5381 Other malaise: Secondary | ICD-10-CM

## 2011-02-18 DIAGNOSIS — Z113 Encounter for screening for infections with a predominantly sexual mode of transmission: Secondary | ICD-10-CM

## 2011-02-18 DIAGNOSIS — E785 Hyperlipidemia, unspecified: Secondary | ICD-10-CM

## 2011-02-18 DIAGNOSIS — E109 Type 1 diabetes mellitus without complications: Secondary | ICD-10-CM

## 2011-02-18 DIAGNOSIS — E119 Type 2 diabetes mellitus without complications: Secondary | ICD-10-CM

## 2011-02-18 DIAGNOSIS — F329 Major depressive disorder, single episode, unspecified: Secondary | ICD-10-CM

## 2011-02-18 DIAGNOSIS — Z23 Encounter for immunization: Secondary | ICD-10-CM

## 2011-02-18 DIAGNOSIS — Z111 Encounter for screening for respiratory tuberculosis: Secondary | ICD-10-CM

## 2011-02-18 MED ORDER — PRAVASTATIN SODIUM 80 MG PO TABS: 80.0000 mg | ORAL_TABLET | Freq: Every evening | ORAL | Status: DC

## 2011-02-18 MED ORDER — VITAMIN D3 1.25 MG (50000 UT) PO CAPS: 50000.0000 [IU] | ORAL_CAPSULE | ORAL | Status: DC

## 2011-02-18 MED ORDER — METFORMIN HCL 1000 MG PO TABS: ORAL_TABLET | ORAL | Status: AC

## 2011-02-18 NOTE — Patient Instructions (Addendum)
Fasting Labs today.  TB test placed today.  RETURN in 2 days for this to be read.  TdaP and Flu vac today  Dose increase pravastatin 40mg  two tabs or  80mg  one tablet at bedtime.  Metformin inc 1000mg  TWO at bedtime  Weekly Vit D is sent in also

## 2011-02-19 LAB — HEMOGLOBIN A1C
Hgb A1c MFr Bld: 9 % — ABNORMAL HIGH (ref <5.7)
Mean Plasma Glucose: 212 mg/dL — ABNORMAL HIGH (ref <117)

## 2011-02-19 LAB — CBC WITH DIFFERENTIAL/PLATELET
Eosinophils Relative: 2 % (ref 0–5)
HCT: 38.4 % (ref 36.0–46.0)
Hemoglobin: 13.2 g/dL (ref 12.0–15.0)
Lymphocytes Relative: 47 % — ABNORMAL HIGH (ref 12–46)
Lymphs Abs: 2.6 10*3/uL (ref 0.7–4.0)
MCV: 85.9 fL (ref 78.0–100.0)
Monocytes Absolute: 0.3 10*3/uL (ref 0.1–1.0)
Monocytes Relative: 6 % (ref 3–12)
Neutro Abs: 2.6 10*3/uL (ref 1.7–7.7)
RDW: 13 % (ref 11.5–15.5)
WBC: 5.7 10*3/uL (ref 4.0–10.5)

## 2011-02-19 LAB — LIPID PANEL
HDL: 56 mg/dL (ref >39)
LDL Cholesterol: 140 mg/dL — ABNORMAL HIGH (ref 0–99)

## 2011-02-19 LAB — HIV ANTIBODY (ROUTINE TESTING W REFLEX): HIV: NONREACTIVE

## 2011-02-19 LAB — RPR

## 2011-02-20 ENCOUNTER — Ambulatory Visit (HOSPITAL_COMMUNITY)
Admission: RE | Admit: 2011-02-20 | Discharge: 2011-02-20 | Disposition: A | Discharge status: Home / Self Care | Payer: Medicaid Other | Source: Ambulatory Visit | Attending: Family Medicine | Admitting: Family Medicine

## 2011-02-20 ENCOUNTER — Other Ambulatory Visit: Payer: Self-pay | Admitting: Family Medicine

## 2011-02-20 ENCOUNTER — Other Ambulatory Visit: Payer: Self-pay

## 2011-02-20 DIAGNOSIS — Z111 Encounter for screening for respiratory tuberculosis: Secondary | ICD-10-CM

## 2011-02-20 DIAGNOSIS — R7611 Nonspecific reaction to tuberculin skin test without active tuberculosis: Secondary | ICD-10-CM | POA: Insufficient documentation

## 2011-02-20 LAB — TB SKIN TEST
Induration: 3
TB Skin Test: POSITIVE mm

## 2011-02-20 MED ORDER — INSULIN GLARGINE 100 UNIT/ML ~~LOC~~ SOLN: 10.0000 [IU] | Freq: Every day | SUBCUTANEOUS | Status: DC

## 2011-02-20 NOTE — Progress Notes (Signed)
Patient was given chest xray order and called health dept to schedule her with them. Spoke with Claris Gower at communicable disease at [facility]. She stated for patient to have xray done first and depending on the results to come in and bring her paperwork. Stated that if the chest xray is negative then no need to take any precautionary medication. Will send patient over anyway because she is due to have a new baby in the family.

## 2011-02-22 NOTE — Progress Notes (Signed)
  Subjective:    Patient ID: Lisa Todd, female    DOB: 22-Apr-1955, 55 y.o.   MRN: [id number]  HPI The PT is here for follow up and re-evaluation of chronic medical conditions, medication management and review of any available recent lab and radiology data.  Preventive health is updated, specifically  Cancer screening and Immunization.   Questions or concerns regarding consultations or procedures which the PT has had in the interim are  addressed. The PT denies any adverse reactions to current medications since the last visit.  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 She has no h/o fever, chills , productive cough or weight loss. She has not been testing blood sugars regulalrly. She is under a lot of stress which is not unusual for her      Review of Systems See HPI Denies recent fever or chills. Denies sinus pressure, nasal congestion, ear pain or sore throat. Denies chest congestion, productive cough or wheezing. Denies chest pains, palpitations and leg swelling Denies abdominal pain, nausea, vomiting,diarrhea or constipation.   Denies dysuria, frequency, hesitancy or incontinencDenies joint pain, swelling and limitation in mobility. Denies headaches, seizures, numbness, or tingling. Denies skin break down or rash.        Objective:   Physical Exam Patient alert and oriented and in no cardiopulmonary distress.  HEENT: No facial asymmetry, EOMI, no sinus tenderness,  oropharynx pink and moist.  Neck supple no adenopathy.  Chest: Clear to auscultation bilaterally.  CVS: S1, S2 no murmurs, no S3.  ABD: Soft non tender. Bowel sounds normal.  Ext: No edema  [id number]  ROM spine, shoulders, hips and knees.  Skin: Intact, no ulcerations or rash noted.  Psych: Good eye contact, normal affect. Memory intact mildly  anxious not  depressed  appearing.  CNS: CN 2-12 intact, power, tone and sensation normal throughout.        Assessment & Plan:

## 2011-02-22 NOTE — Assessment & Plan Note (Signed)
Deteriorated, not suicidal or homicidal

## 2011-02-22 NOTE — Assessment & Plan Note (Signed)
Controlled, no change in medication  

## 2011-02-22 NOTE — Assessment & Plan Note (Signed)
Uncontrolled, increased stress always c/o no income and being unable to make it

## 2011-02-22 NOTE — Assessment & Plan Note (Signed)
Uncontrolled and deteriorated.Lo fat diet discussed and encouraged. Med adjusted based on lab data obtained after the visit

## 2011-02-22 NOTE — Assessment & Plan Note (Signed)
Uncontrolled pt to resume lantus and to test regularly so that titration is feasible

## 2011-02-23 ENCOUNTER — Telehealth: Payer: Self-pay | Admitting: Family Medicine

## 2011-02-23 DIAGNOSIS — E875 Hyperkalemia: Secondary | ICD-10-CM

## 2011-02-23 LAB — COMPLETE METABOLIC PANEL WITH GFR
AST: 13 U/L (ref 0–37)
Albumin: 3.9 g/dL (ref 3.5–5.2)
Alkaline Phosphatase: 39 U/L (ref 39–117)
GFR, Est Non African American: >89 mL/min (ref >89)
Glucose, Bld: 181 mg/dL — ABNORMAL HIGH (ref 70–99)
Potassium: 7.5 mEq/L (ref 3.5–5.3)
Sodium: 134 mEq/L — ABNORMAL LOW (ref 135–145)
Total Bilirubin: 0.5 mg/dL (ref 0.3–1.2)
Total Protein: 6.5 g/dL (ref 6.0–8.3)

## 2011-02-23 NOTE — Telephone Encounter (Signed)
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 I will cancel the referral to his office, she does not need re testing or treatment based on this conversation

## 2011-02-27 NOTE — Telephone Encounter (Signed)
Normal , no active lung disease pt was told several days ago by me

## 2011-03-03 NOTE — Progress Notes (Signed)
Pt has appt with dr. Juanetta Gosling for 03/11/2011 2:30. Pt aware

## 2011-03-11 ENCOUNTER — Telehealth: Payer: Self-pay | Admitting: Family Medicine

## 2011-03-17 ENCOUNTER — Telehealth: Payer: Self-pay | Admitting: Family Medicine

## 2011-03-17 MED ORDER — PHENTERMINE HCL 37.5 MG PO CAPS: ORAL_CAPSULE | ORAL | Status: DC

## 2011-03-17 NOTE — Telephone Encounter (Signed)
Printed for dr to sign

## 2011-03-26 ENCOUNTER — Telehealth: Payer: Self-pay | Admitting: Family Medicine

## 2011-03-27 NOTE — Telephone Encounter (Signed)
Called pt listed number and it is no longer working.

## 2011-03-27 NOTE — Telephone Encounter (Signed)
I am assuming she is talking about Dr Fransico Him, if so, she needs to call his office for the order, I never havepts testing 5 times, verify she is seeing the specialist about her sugar.

## 2011-03-28 ENCOUNTER — Other Ambulatory Visit: Payer: Self-pay | Admitting: Family Medicine

## 2011-04-01 NOTE — Telephone Encounter (Signed)
Made another attempt to call listed number and it is not in service.

## 2011-04-02 ENCOUNTER — Telehealth: Payer: Self-pay | Admitting: Family Medicine

## 2011-04-02 NOTE — Telephone Encounter (Signed)
I never request testing more than 4 times per day. I tried to call her since I thionk she is now seeing Dr Fransico Him, if this is the case she needs to test based on what he recommends

## 2011-04-02 NOTE — Telephone Encounter (Signed)
How many times a day do you want her to be testing? She wants enough for 5-6 times/day

## 2011-04-03 MED ORDER — GLUCOSE BLOOD VI STRP: ORAL_STRIP | Status: DC

## 2011-04-03 NOTE — Telephone Encounter (Signed)
Sent in

## 2011-04-06 ENCOUNTER — Encounter: Payer: Self-pay | Admitting: Family Medicine

## 2011-04-07 ENCOUNTER — Other Ambulatory Visit (HOSPITAL_COMMUNITY): Payer: Self-pay | Admitting: Pulmonary Disease

## 2011-04-07 ENCOUNTER — Other Ambulatory Visit (HOSPITAL_COMMUNITY): Payer: Self-pay

## 2011-04-10 ENCOUNTER — Ambulatory Visit (HOSPITAL_COMMUNITY): Payer: Medicaid Other

## 2011-04-15 ENCOUNTER — Ambulatory Visit (HOSPITAL_COMMUNITY): Admission: RE | Admit: 2011-04-15 | Payer: Medicaid Other | Source: Ambulatory Visit

## 2011-04-20 ENCOUNTER — Ambulatory Visit (HOSPITAL_COMMUNITY): Payer: Medicaid Other | Attending: Pulmonary Disease

## 2011-04-23 ENCOUNTER — Ambulatory Visit (HOSPITAL_COMMUNITY)
Admission: RE | Admit: 2011-04-23 | Discharge: 2011-04-23 | Disposition: A | Discharge status: Home / Self Care | Payer: Medicaid Other | Source: Ambulatory Visit | Attending: Pulmonary Disease | Admitting: Pulmonary Disease

## 2011-04-23 DIAGNOSIS — R0602 Shortness of breath: Secondary | ICD-10-CM | POA: Insufficient documentation

## 2011-04-26 NOTE — Procedures (Signed)
[name], Lisa Todd               ACCOUNT NO.:  [id number]  MEDICAL RECORD NO.:  [id number]  LOCATION:  RESP                          FACILITY:  [facility]  PHYSICIAN:  Muaad Boehning L. Juanetta Gosling, M.D.DATE OF BIRTH:  August 02, 1955  DATE OF PROCEDURE: DATE OF DISCHARGE:  04/23/2011                           PULMONARY FUNCTION TEST   Reason for pulmonary function testing is shortness of breath. 1. Spirometry is normal. 2. Lung volumes are normal. 3. DLCO is mildly reduced, but when corrected for ventilation is     normal. 4. No cause of shortness of breath seen on this pulmonary function     test.     Ramon Dredge L. Juanetta Gosling, M.D.     [facility]  D:  04/26/2011  T:  04/26/2011  Job:  [id number]

## 2011-04-28 ENCOUNTER — Telehealth: Payer: Self-pay

## 2011-04-28 NOTE — Telephone Encounter (Signed)
She is running out of strips and she states that you are telling her to test 6-7 times a day but they are only giving her enough for 4 times daily and she is running out. She is upset because she checks before and after meals and at bedtime and they are telling her we are not sending in enough for that. Please advise. She is very upset. States if you don't want her testing that much then why are we telling her to do so and then not giving her enough strips [phone]

## 2011-04-28 NOTE — Telephone Encounter (Signed)
Pt aware she needs to test at most 4 times per day, I discussed this with her , she is aware

## 2011-04-29 ENCOUNTER — Telehealth: Payer: Self-pay

## 2011-04-29 DIAGNOSIS — E109 Type 1 diabetes mellitus without complications: Secondary | ICD-10-CM

## 2011-04-29 DIAGNOSIS — R5383 Other fatigue: Secondary | ICD-10-CM

## 2011-04-29 DIAGNOSIS — E785 Hyperlipidemia, unspecified: Secondary | ICD-10-CM

## 2011-04-29 NOTE — Telephone Encounter (Signed)
Ordered labs for patient to have done before next visit. Will put up front for pickup

## 2011-05-05 ENCOUNTER — Other Ambulatory Visit: Payer: Self-pay

## 2011-05-05 MED ORDER — IBUPROFEN 800 MG PO TABS: 800.0000 mg | ORAL_TABLET | Freq: Two times a day (BID) | ORAL | Status: DC | PRN

## 2011-05-13 ENCOUNTER — Encounter: Payer: Self-pay | Admitting: Family Medicine

## 2011-05-15 ENCOUNTER — Encounter: Payer: Self-pay | Admitting: Family Medicine

## 2011-05-18 ENCOUNTER — Encounter: Payer: Self-pay | Admitting: Family Medicine

## 2011-05-18 ENCOUNTER — Ambulatory Visit (INDEPENDENT_AMBULATORY_CARE_PROVIDER_SITE_OTHER): Payer: Medicaid Other | Admitting: Family Medicine

## 2011-05-18 VITALS — BP 120/82 | HR 88 | Resp 16 | Ht 63.0 in | Wt 277.4 lb

## 2011-05-18 DIAGNOSIS — I10 Essential (primary) hypertension: Secondary | ICD-10-CM

## 2011-05-18 DIAGNOSIS — M549 Dorsalgia, unspecified: Secondary | ICD-10-CM

## 2011-05-18 DIAGNOSIS — N3000 Acute cystitis without hematuria: Secondary | ICD-10-CM

## 2011-05-18 DIAGNOSIS — M545 Low back pain: Secondary | ICD-10-CM

## 2011-05-18 DIAGNOSIS — E109 Type 1 diabetes mellitus without complications: Secondary | ICD-10-CM

## 2011-05-18 DIAGNOSIS — E785 Hyperlipidemia, unspecified: Secondary | ICD-10-CM

## 2011-05-18 DIAGNOSIS — Z1211 Encounter for screening for malignant neoplasm of colon: Secondary | ICD-10-CM

## 2011-05-18 DIAGNOSIS — J45909 Unspecified asthma, uncomplicated: Secondary | ICD-10-CM

## 2011-05-18 DIAGNOSIS — N898 Other specified noninflammatory disorders of vagina: Secondary | ICD-10-CM

## 2011-05-18 LAB — HEMOGLOBIN A1C
Hgb A1c MFr Bld: 8.3 % — ABNORMAL HIGH (ref <5.7)
Mean Plasma Glucose: 192 mg/dL — ABNORMAL HIGH (ref <117)

## 2011-05-18 LAB — POCT URINALYSIS DIPSTICK
Bilirubin, UA: NEGATIVE
Blood, UA: NEGATIVE
Glucose, UA: NEGATIVE
Nitrite, UA: NEGATIVE
Spec Grav, UA: 1.015
Urobilinogen, UA: 0.2

## 2011-05-18 LAB — BASIC METABOLIC PANEL
Calcium: 8.7 mg/dL (ref 8.4–10.5)
Chloride: 102 mEq/L (ref 96–112)
Creat: 0.61 mg/dL (ref 0.50–1.10)
Sodium: 135 mEq/L (ref 135–145)

## 2011-05-18 NOTE — Patient Instructions (Signed)
F/u in 3 months.  You are referred to GI , dr Karilyn Cota for colonoscopy, and to Dr Fransico Him for uncontrolled blood sugars.  HBA1C and chem 7 today.  Fasting lipid and cmp in 3 months just before f/u  Urine shows no infection

## 2011-05-18 NOTE — Progress Notes (Signed)
  Subjective:    Patient ID: Lisa Todd, female    DOB: 08/15/1955, 56 y.o.   MRN: [id number]  HPI The PT is here for follow up and re-evaluation of chronic medical conditions, medication management and review of any available recent lab and radiology data.  Preventive health is updated, specifically  Cancer screening and Immunization. Agrees on colonoscopy which is past due  The PT denies any adverse reactions to current medications since the last visit.  C/o increased back pain with mild dysuria and frequency in the past week. No fever or chills. Continues to work on weight loss, though feels discouraged. Blood sugars fluctuate and are uncontrolled, she will be referred to endocrinologist locally and is happy about this.     Review of Systems See HPI Denies recent fever or chills. Denies sinus pressure, nasal congestion, ear pain or sore throat. Denies chest congestion, productive cough or wheezing. Denies chest pains, palpitations and leg swelling Denies abdominal pain, nausea, vomiting,diarrhea or constipation.    Denies headaches, seizures, numbness, or tingling. Chronic  Depression and  anxiety unchanged, pt not as anxious as in the past, now she has a grand daughter to care for.She is not tearful, and reports improvement in her depression also, states she ahs a "new attitude" Denies skin break down or rash.        Objective:   Physical Exam Diabetic Foot Check:  Appearance -  Calluses and onychomycosis Skin - no unusual pallor or redness Sensation - grossly intact to light touch Monofilament testing -  Right - Great toe, medial, central, lateral ball and posterior foot diminished Left - Great toe, medial, central, lateral ball and posterior foot diminished Pulses Left - Dorsalis Pedis and Posterior Tibia normal Right - Dorsalis Pedis and Posterior Tibia normal  Patient alert and oriented and in no cardiopulmonary distress.  HEENT: No facial asymmetry, EOMI, no  sinus tenderness,  oropharynx pink and moist.  Neck supple no adenopathy.  Chest: Clear to auscultation bilaterally.Adequate air entry throughout  CVS: S1, S2 no murmurs, no S3.  ABD: Soft non tender. Bowel sounds normal.  Ext: No edema  MS: Decreased ROM spine and knees.adequate in shoulders  Skin: Intact, no ulcerations or rash noted.  Psych: Good eye contact, normal affect. Memory intact not anxious or depressed appearing.  CNS: CN 2-12 intact, power, and tone normal throughout.       Assessment & Plan:

## 2011-05-19 LAB — WET PREP BY MOLECULAR PROBE
Gardnerella vaginalis: NEGATIVE
Trichomonas vaginosis: NEGATIVE

## 2011-05-19 LAB — GC/CHLAMYDIA PROBE AMP, GENITAL: Chlamydia, DNA Probe: NEGATIVE

## 2011-05-21 ENCOUNTER — Encounter (INDEPENDENT_AMBULATORY_CARE_PROVIDER_SITE_OTHER): Payer: Self-pay | Admitting: *Deleted

## 2011-05-30 NOTE — Assessment & Plan Note (Signed)
Uncontrolled, referral to endo for management

## 2011-05-30 NOTE — Assessment & Plan Note (Signed)
Symptoms of frequency and mild dysuria, CCUA normal

## 2011-05-30 NOTE — Assessment & Plan Note (Signed)
Controlled, no change in medication  

## 2011-05-30 NOTE — Assessment & Plan Note (Signed)
Low fat diet discussed and encouraged. Labs due in the next 3 month

## 2011-05-30 NOTE — Assessment & Plan Note (Signed)
3 day h/o increased pain, no specific aggravant, pt concerned may be due to uti

## 2011-05-30 NOTE — Assessment & Plan Note (Signed)
Stable, no recent flare 

## 2011-06-01 ENCOUNTER — Telehealth: Payer: Self-pay | Admitting: Family Medicine

## 2011-06-01 NOTE — Telephone Encounter (Signed)
If you want to send, I put form and office notes on bookshelf on purple folder

## 2011-06-01 NOTE — Telephone Encounter (Signed)
Script signed, pls send and let her know

## 2011-06-02 NOTE — Telephone Encounter (Signed)
Pt aware.

## 2011-06-09 ENCOUNTER — Other Ambulatory Visit: Payer: Self-pay | Admitting: Family Medicine

## 2011-06-11 ENCOUNTER — Telehealth: Payer: Self-pay | Admitting: Family Medicine

## 2011-06-11 NOTE — Telephone Encounter (Signed)
pls print new scripts same meds state BRAND name, I wish her luck, should not be such a big difference

## 2011-06-11 NOTE — Telephone Encounter (Signed)
The cardizem and glipizide and phentermine. States she was getting brand names until 3 months ago at [facility]. And now she is getting generic and they are causing her all kinds of problems. She is having all the side effects that are listed and said she has to go back to brand names for those 3 meds. I asked her if she was sure medicaid had been paying for brand and she said yes to let you know and you would write it for the brand.

## 2011-06-12 ENCOUNTER — Other Ambulatory Visit: Payer: Self-pay

## 2011-06-12 MED ORDER — GLUCOTROL XL 10 MG PO TB24: 10.0000 mg | ORAL_TABLET | Freq: Every day | ORAL | Status: DC

## 2011-06-12 MED ORDER — CARDIZEM LA 420 MG PO TB24: 420.0000 mg | ORAL_TABLET | Freq: Every day | ORAL | Status: DC

## 2011-06-12 MED ORDER — PHENTERMINE HCL 37.5 MG PO CAPS: ORAL_CAPSULE | ORAL | Status: DC

## 2011-06-12 NOTE — Telephone Encounter (Signed)
meds printed and sent in a dispense as written

## 2011-06-12 NOTE — Telephone Encounter (Signed)
Pt aware.

## 2011-06-24 ENCOUNTER — Other Ambulatory Visit: Payer: Self-pay | Admitting: Family Medicine

## 2011-06-24 NOTE — Telephone Encounter (Signed)
Do you want to refill phentermine? 

## 2011-06-24 NOTE — Telephone Encounter (Signed)
Prescription written 2/22 tooearly, no need to ask on early fill will not do this

## 2011-08-05 ENCOUNTER — Other Ambulatory Visit: Payer: Self-pay | Admitting: Family Medicine

## 2011-08-06 ENCOUNTER — Encounter: Payer: Self-pay | Admitting: Family Medicine

## 2011-08-06 ENCOUNTER — Ambulatory Visit (INDEPENDENT_AMBULATORY_CARE_PROVIDER_SITE_OTHER): Payer: Medicaid Other | Admitting: Family Medicine

## 2011-08-06 VITALS — BP 160/90 | HR 81 | Resp 18 | Ht 63.0 in | Wt 278.1 lb

## 2011-08-06 DIAGNOSIS — I10 Essential (primary) hypertension: Secondary | ICD-10-CM

## 2011-08-06 DIAGNOSIS — E109 Type 1 diabetes mellitus without complications: Secondary | ICD-10-CM

## 2011-08-06 DIAGNOSIS — M549 Dorsalgia, unspecified: Secondary | ICD-10-CM

## 2011-08-06 DIAGNOSIS — J45909 Unspecified asthma, uncomplicated: Secondary | ICD-10-CM

## 2011-08-06 DIAGNOSIS — E669 Obesity, unspecified: Secondary | ICD-10-CM

## 2011-08-06 DIAGNOSIS — M545 Low back pain: Secondary | ICD-10-CM

## 2011-08-06 DIAGNOSIS — E785 Hyperlipidemia, unspecified: Secondary | ICD-10-CM

## 2011-08-06 DIAGNOSIS — F329 Major depressive disorder, single episode, unspecified: Secondary | ICD-10-CM

## 2011-08-06 DIAGNOSIS — J209 Acute bronchitis, unspecified: Secondary | ICD-10-CM

## 2011-08-06 MED ORDER — KETOROLAC TROMETHAMINE 60 MG/2ML IJ SOLN
60.0000 mg | Freq: Once | INTRAMUSCULAR | Status: AC
  Administered 2011-08-06: 60 mg via INTRAMUSCULAR

## 2011-08-06 MED ORDER — BENZONATATE 100 MG PO CAPS: 100.0000 mg | ORAL_CAPSULE | Freq: Four times a day (QID) | ORAL | Status: DC | PRN

## 2011-08-06 MED ORDER — PENICILLIN V POTASSIUM 500 MG PO TABS: 500.0000 mg | ORAL_TABLET | Freq: Three times a day (TID) | ORAL | Status: AC

## 2011-08-06 NOTE — Patient Instructions (Addendum)
F/u in 2 month  Blood pressure is high today, please ensure you take your medication every day as scheduled.  Fasting lipid, cmp and EGFR in 2 month.  Keep appointments with Dr Fransico Him, and follow his eating plan to improve blood sugars.  You need to ask the clinic in [place] re weight loss to send in a referral request , I will ok this once I get info from them.  Toradol 60mg  iM today for back pain.  I am sorry about your recent loss  Medication is sent in for bronchitis

## 2011-08-06 NOTE — Assessment & Plan Note (Signed)
Uncontrolled at this visit ,has been off medication for 1 montnh
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
## 2011-08-08 NOTE — Assessment & Plan Note (Signed)
Increased and uncontrolled due to recent loss of ex- spuse

## 2011-08-08 NOTE — Assessment & Plan Note (Signed)
Uncontrolled, endo treating

## 2011-08-08 NOTE — Assessment & Plan Note (Signed)
Recent flare with uncontrolled pain, toradol in office

## 2011-08-08 NOTE — Assessment & Plan Note (Signed)
Currently controlled, no recent flare

## 2011-08-08 NOTE — Assessment & Plan Note (Signed)
Antibiotic and decongestant prescribed 

## 2011-08-08 NOTE — Assessment & Plan Note (Signed)
Hyperlipidemia:Low fat diet discussed and encouraged.  Updated labs needed 

## 2011-08-11 ENCOUNTER — Telehealth: Payer: Self-pay | Admitting: Family Medicine

## 2011-08-11 DIAGNOSIS — R300 Dysuria: Secondary | ICD-10-CM

## 2011-08-11 NOTE — Telephone Encounter (Signed)
Pt looks to be on penicillin already. Please advise?

## 2011-08-11 NOTE — Telephone Encounter (Signed)
done

## 2011-08-11 NOTE — Telephone Encounter (Signed)
Send urine for c/s pls

## 2011-08-14 LAB — URINE CULTURE: Colony Count: >=100000

## 2011-08-17 ENCOUNTER — Ambulatory Visit: Payer: Medicaid Other | Admitting: Family Medicine

## 2011-09-17 ENCOUNTER — Ambulatory Visit: Payer: Medicaid Other | Admitting: Family Medicine

## 2011-09-17 ENCOUNTER — Encounter: Payer: Self-pay | Admitting: Family Medicine

## 2011-09-17 ENCOUNTER — Ambulatory Visit (INDEPENDENT_AMBULATORY_CARE_PROVIDER_SITE_OTHER): Payer: Medicaid Other | Admitting: Family Medicine

## 2011-09-17 VITALS — BP 136/80 | HR 93 | Resp 18 | Ht 63.0 in | Wt 281.1 lb

## 2011-09-17 DIAGNOSIS — I10 Essential (primary) hypertension: Secondary | ICD-10-CM

## 2011-09-17 DIAGNOSIS — E669 Obesity, unspecified: Secondary | ICD-10-CM

## 2011-09-17 DIAGNOSIS — E109 Type 1 diabetes mellitus without complications: Secondary | ICD-10-CM

## 2011-09-17 DIAGNOSIS — E785 Hyperlipidemia, unspecified: Secondary | ICD-10-CM

## 2011-09-17 DIAGNOSIS — F329 Major depressive disorder, single episode, unspecified: Secondary | ICD-10-CM

## 2011-09-17 DIAGNOSIS — J45909 Unspecified asthma, uncomplicated: Secondary | ICD-10-CM

## 2011-09-17 DIAGNOSIS — F3289 Other specified depressive episodes: Secondary | ICD-10-CM

## 2011-09-17 LAB — GLUCOSE, POCT (MANUAL RESULT ENTRY): POC Glucose: 315 mg/dl — AB (ref 70–99)

## 2011-09-17 MED ORDER — INSULIN ASPART 100 UNIT/ML ~~LOC~~ SOLN
5.0000 [IU] | Freq: Once | SUBCUTANEOUS | Status: AC
  Administered 2011-09-17: 5 [IU] via SUBCUTANEOUS

## 2011-09-17 MED ORDER — VENLAFAXINE HCL ER 37.5 MG PO CP24: 37.5000 mg | ORAL_CAPSULE | Freq: Every day | ORAL | Status: DC

## 2011-09-17 NOTE — Progress Notes (Signed)
  Subjective:    Patient ID: Lisa Todd, female    DOB: Nov 04, 1955, 56 y.o.   MRN: [id number]  HPI The PT is here for follow up and re-evaluation of chronic medical conditions, medication management and review of any available recent lab and radiology data.  Preventive health is updated, specifically  Cancer screening and Immunization.   The PT denies any adverse reactions to current medications since the last visit.  Tearful due to being stressed and overwhelmed by family problems, espescialy with her daughter. Has started regaining weight lost which is espescialy disturbing.Has not been keeping endo appts and reports uncontrolled blood sugars    Review of Systems See HPI Denies recent fever or chills. Denies sinus pressure, nasal congestion, ear pain or sore throat. Denies chest congestion, productive cough or wheezing. Denies chest pains, palpitations and leg swelling Denies abdominal pain, nausea, vomiting,diarrhea or constipation.   Denies dysuria, frequency, hesitancy or incontinence. Denies joint pain, swelling and limitation in mobility. Denies headaches, seizures, numbness, or tingling. C/o increased depression and  anxiety not suicidal or homicidal, no hallucinations Denies skin break down or rash.        Objective:   Physical Exam Patient alert and oriented and in no cardiopulmonary distress.Tearful  HEENT: No facial asymmetry, EOMI, no sinus tenderness,  oropharynx pink and moist.  Neck supple no adenopathy.  Chest: Clear to auscultation bilaterally.  CVS: S1, S2 no murmurs, no S3.  ABD: Soft non tender. Bowel sounds normal.  Ext: No edema  MS: Adequate ROM spine, shoulders, hips and knees.  Skin: Intact, no ulcerations or rash noted.  Psych: Good eye contact,  Memory intact anxious and  depressed appearing.  CNS: CN 2-12 intact, power, tone and sensation normal throughout.        Assessment & Plan:

## 2011-09-17 NOTE — Patient Instructions (Signed)
F/u in 4 week  You need too start medication specifically for depression. Effexor is prescribed, and I recommend strongly that you start therapy.  Please resume healthy eating and start exercise so that you lose weight again.  Please keep appt with Dr Fransico Him, blood sugar today is high you will get 5 units regular insulin in the office.  Fasting lipid,cmp , HBA1C in am and copy to Dr Fransico Him

## 2011-09-17 NOTE — Assessment & Plan Note (Signed)
Deteriorated, pt now gaining weight overwhelmed by family problems

## 2011-09-21 NOTE — Assessment & Plan Note (Signed)
Deteriorated. Patient re-educated about  the importance of commitment to a  minimum of 150 minutes of exercise per week. The importance of healthy food choices with portion control discussed. Encouraged to start a food diary, count calories and to consider  joining a support group. Sample diet sheets offered. Goals set by the patient for the next several months.    

## 2011-09-21 NOTE — Assessment & Plan Note (Signed)
Controlled, no change in medication  

## 2011-09-21 NOTE — Assessment & Plan Note (Signed)
Hyperlipidemia:Low fat diet discussed and encouraged.  Updated labs needed 

## 2011-09-21 NOTE — Assessment & Plan Note (Signed)
Stable no recent flare on current meds

## 2011-09-21 NOTE — Assessment & Plan Note (Signed)
Uncontrolled, non compliance remains an issue, pt continues to verbalize multiple personal stressors which get in the way of her taking care of her health, unwilling to go to counselor  Needs to re establish with endo

## 2011-09-24 ENCOUNTER — Other Ambulatory Visit: Payer: Self-pay | Admitting: Family Medicine

## 2011-09-30 ENCOUNTER — Other Ambulatory Visit: Payer: Self-pay | Admitting: Family Medicine

## 2011-10-02 ENCOUNTER — Telehealth: Payer: Self-pay | Admitting: Family Medicine

## 2011-10-02 ENCOUNTER — Other Ambulatory Visit: Payer: Self-pay | Admitting: Family Medicine

## 2011-10-02 MED ORDER — PANTOPRAZOLE SODIUM 40 MG PO TBEC: 40.0000 mg | DELAYED_RELEASE_TABLET | Freq: Every day | ORAL | Status: DC

## 2011-10-02 NOTE — Telephone Encounter (Signed)
Sent to the pharmacy

## 2011-10-02 NOTE — Telephone Encounter (Signed)
Only failed omeprazole as far as I know so let pharmacy and pt know she will try protonix, I am sending this in

## 2011-10-15 ENCOUNTER — Ambulatory Visit: Payer: Medicaid Other | Admitting: Family Medicine

## 2011-10-15 ENCOUNTER — Encounter: Payer: Self-pay | Admitting: Family Medicine

## 2011-11-03 ENCOUNTER — Telehealth: Payer: Self-pay | Admitting: Family Medicine

## 2011-11-03 ENCOUNTER — Other Ambulatory Visit: Payer: Self-pay | Admitting: Family Medicine

## 2011-11-03 ENCOUNTER — Other Ambulatory Visit (INDEPENDENT_AMBULATORY_CARE_PROVIDER_SITE_OTHER): Payer: Self-pay | Admitting: *Deleted

## 2011-11-03 ENCOUNTER — Encounter: Payer: Self-pay | Admitting: Family Medicine

## 2011-11-03 ENCOUNTER — Ambulatory Visit (INDEPENDENT_AMBULATORY_CARE_PROVIDER_SITE_OTHER): Payer: Medicaid Other | Admitting: Family Medicine

## 2011-11-03 ENCOUNTER — Encounter (INDEPENDENT_AMBULATORY_CARE_PROVIDER_SITE_OTHER): Payer: Self-pay | Admitting: *Deleted

## 2011-11-03 ENCOUNTER — Telehealth (INDEPENDENT_AMBULATORY_CARE_PROVIDER_SITE_OTHER): Payer: Self-pay | Admitting: *Deleted

## 2011-11-03 VITALS — BP 140/84 | HR 86 | Resp 16 | Ht 63.0 in | Wt 292.8 lb

## 2011-11-03 DIAGNOSIS — N3000 Acute cystitis without hematuria: Secondary | ICD-10-CM

## 2011-11-03 DIAGNOSIS — Z139 Encounter for screening, unspecified: Secondary | ICD-10-CM

## 2011-11-03 DIAGNOSIS — F329 Major depressive disorder, single episode, unspecified: Secondary | ICD-10-CM

## 2011-11-03 DIAGNOSIS — G4730 Sleep apnea, unspecified: Secondary | ICD-10-CM

## 2011-11-03 DIAGNOSIS — E785 Hyperlipidemia, unspecified: Secondary | ICD-10-CM

## 2011-11-03 DIAGNOSIS — N390 Urinary tract infection, site not specified: Secondary | ICD-10-CM

## 2011-11-03 DIAGNOSIS — E109 Type 1 diabetes mellitus without complications: Secondary | ICD-10-CM

## 2011-11-03 DIAGNOSIS — I10 Essential (primary) hypertension: Secondary | ICD-10-CM

## 2011-11-03 DIAGNOSIS — Z1211 Encounter for screening for malignant neoplasm of colon: Secondary | ICD-10-CM

## 2011-11-03 LAB — POCT URINALYSIS DIPSTICK
Bilirubin, UA: NEGATIVE
Ketones, UA: NEGATIVE
Protein, UA: NEGATIVE
Spec Grav, UA: 1.025
pH, UA: 7

## 2011-11-03 MED ORDER — PEG-KCL-NACL-NASULF-NA ASC-C 100 G PO SOLR: 1.0000 | Freq: Once | ORAL | Status: DC

## 2011-11-03 MED ORDER — FLUCONAZOLE 150 MG PO TABS: ORAL_TABLET | ORAL | Status: AC

## 2011-11-03 MED ORDER — VENLAFAXINE HCL ER 75 MG PO CP24: ORAL_CAPSULE | ORAL | Status: DC

## 2011-11-03 MED ORDER — CIPROFLOXACIN HCL 500 MG PO TABS: 500.0000 mg | ORAL_TABLET | Freq: Two times a day (BID) | ORAL | Status: AC

## 2011-11-03 MED ORDER — CHOLINE FENOFIBRATE 135 MG PO CPDR: 135.0000 mg | DELAYED_RELEASE_CAPSULE | Freq: Every day | ORAL | Status: DC

## 2011-11-03 MED ORDER — PRAVASTATIN SODIUM 80 MG PO TABS: 80.0000 mg | ORAL_TABLET | Freq: Every evening | ORAL | Status: DC

## 2011-11-03 MED ORDER — PHENTERMINE HCL 37.5 MG PO CAPS: ORAL_CAPSULE | ORAL | Status: DC

## 2011-11-03 NOTE — Progress Notes (Signed)
  Subjective:    Patient ID: Lisa Todd, female    DOB: 1955-12-26, 56 y.o.   MRN: [id number]  HPI The PT is here for follow up and re-evaluation of chronic medical conditions, medication management and review of any available recent lab and radiology data.  Preventive health is updated, specifically  Cancer screening and Immunization.   Questions or concerns regarding consultations or procedures which the PT has had in the interim are  addressed. The PT denies any adverse reactions to current medications since the last visit.  There are no new concerns.  There are no specific complaints       Review of Systems See HPI  Denies recent fever or chills. Denies sinus pressure, nasal congestion, ear pain or sore throat. Denies chest congestion, productive cough or wheezing. Denies chest pains, palpitations and leg swelling Denies abdominal pain, nausea, vomiting,diarrhea or constipation.   C/o 4 day h/odysuria and  frequency, . Denies disabling  joint pain, swelling and limitation in mobility.Continues to experience right knee pain but to a lesser degree, medication is from ortho and she has excess since not taking as often as prescribed Denies headaches, seizures, numbness, or tingling. Denies skin break down or rash. Continues to experience depression and anxiety, has had some improvement with effexor, less crying spells and irritability, reports poor libido. Stressed since she is gaining weight on insulin, advise patience, get her sugars right then next focus will be a change in med which will help with weight loss        Objective:   Physical Exam  Patient alert and oriented and in no cardiopulmonary distress.  HEENT: No facial asymmetry, EOMI, no sinus tenderness,  oropharynx pink and moist.  Neck supple no adenopathy.  Chest: Clear to auscultation bilaterally.  CVS: S1, S2 no murmurs, no S3.  ABD: Soft non tender. Bowel sounds normal.  Ext: No edema  [id number]  ROM spine, shoulders, hips and knees.  Skin: Intact, no ulcerations or rash noted.  Psych: Good eye contact, normal affect. Memory intact anxious and  depressed appearing.Not suicidal or homicidal  CNS: CN 2-12 intact, power, tone  normal throughout.       Assessment & Plan:

## 2011-11-03 NOTE — Assessment & Plan Note (Signed)
Has an established diagnosis of sleep apnea, and has not used/had a machine for over 1 year. Suffers from chronic fatigue

## 2011-11-03 NOTE — Telephone Encounter (Signed)
Patient needs movi prep 

## 2011-11-03 NOTE — Assessment & Plan Note (Signed)
Acute symptoms with abnormal UA, antibiotic prescribed, spec sent for c/s

## 2011-11-03 NOTE — Assessment & Plan Note (Signed)
Controlled, though systolic not at goal, importance of low sodium diet stressed, hopefully with weight loss the blood pressure will also improve

## 2011-11-03 NOTE — Assessment & Plan Note (Signed)
Uncontrolled, followed by endo 

## 2011-11-03 NOTE — Patient Instructions (Addendum)
F/u  End September.  Fastng lipid, cmp and EGFR  When you are getting labs with dr Fransico Him   Medication is sent in for a urinary tract infection, also tablets for yeast infection if you need them   You need a mammogram and a colonoscopy, discuss both at checkout  Continue to monitor blood sugars and follow the directions of the endocrinologist  Dose increase in venlafaxine as discussed, and you will be referred to Dr Gerilyn Pilgrim for sleep apnea evaluation

## 2011-11-03 NOTE — Assessment & Plan Note (Signed)
Pt states she has been off meds for several month Needs to resume same and follow low fat diet, labs prior to return visit

## 2011-11-03 NOTE — Assessment & Plan Note (Signed)
Improved, but still uncontrolled, less tearful , not suicidal or homicidal, dose inc on current med

## 2011-11-04 NOTE — Telephone Encounter (Signed)
Was entered yesterday, see referral box

## 2011-11-05 ENCOUNTER — Ambulatory Visit (HOSPITAL_COMMUNITY): Payer: Medicaid Other

## 2011-11-06 LAB — URINE CULTURE: Colony Count: >=100000

## 2011-11-09 ENCOUNTER — Telehealth (INDEPENDENT_AMBULATORY_CARE_PROVIDER_SITE_OTHER): Payer: Self-pay | Admitting: *Deleted

## 2011-11-09 NOTE — Telephone Encounter (Signed)
agree

## 2011-11-09 NOTE — Telephone Encounter (Signed)
PCP/Requesting MD: simpson  Name & DOB: Lisa Todd [birth year]     Procedure: tcs  Reason/Indication:  screening  Has patient had this procedure before?  no  If so, when, by whom and where?    Is there a family history of colon cancer?  no  Who?  What age when diagnosed?    Is patient diabetic?   yes      Does patient have prosthetic heart valve?  no  Do you have a pacemaker?  no  Has patient had joint replacement within last 12 months?  no  Is patient on Coumadin, Plavix and/or Aspirin? yes  Medications: asa 81 mg daily, adipex 37.5 daily, cardizem 420 mg daily, flexeril 10 mg @ bedtime prn, ventolin HFA prn, xanax 1 mg bid, vit d3 weekly, trilipix 135 mg daily, cipro bid, lotrimin 1% bid, diflucan 150 mg daily, lantus 35 units @ bedtime, novolog sliding scale, metformin 1000 mg bid, motrin 800 mg tid, nexium 40 mg daily, pantoprazole 40 mg daily, phentermine 37.5 mg daily, pravastatin 80 mg daily, QVAR inhaler 2 puffs bid, venlafaxine 37.5 mg daily  Allergies: nkda  Medication Adjustment: asa 2 days, 1/2 metformin day before, hold lantus, continue novolog  Procedure date & time: 12/09/11 at 1200

## 2011-11-11 ENCOUNTER — Telehealth: Payer: Self-pay | Admitting: Family Medicine

## 2011-11-17 ENCOUNTER — Inpatient Hospital Stay (HOSPITAL_COMMUNITY): Admission: RE | Admit: 2011-11-17 | Payer: Medicaid Other | Source: Ambulatory Visit

## 2011-11-19 ENCOUNTER — Telehealth: Payer: Self-pay | Admitting: Family Medicine

## 2011-11-20 NOTE — Telephone Encounter (Signed)
This letter was sent in

## 2011-11-27 ENCOUNTER — Encounter (HOSPITAL_COMMUNITY): Payer: Self-pay | Admitting: Pharmacy Technician

## 2011-11-27 NOTE — Telephone Encounter (Signed)
Patient is aware 

## 2011-11-30 ENCOUNTER — Ambulatory Visit (HOSPITAL_COMMUNITY): Admission: RE | Admit: 2011-11-30 | Payer: Medicaid Other | Source: Ambulatory Visit

## 2011-12-08 MED ORDER — SODIUM CHLORIDE 0.45 % IV SOLN: Freq: Once | INTRAVENOUS | Status: DC

## 2011-12-10 ENCOUNTER — Encounter (HOSPITAL_COMMUNITY): Payer: Self-pay | Admitting: *Deleted

## 2011-12-10 ENCOUNTER — Emergency Department (HOSPITAL_COMMUNITY)
Admission: EM | Admit: 2011-12-10 | Discharge: 2011-12-10 | Disposition: A | Discharge status: Home / Self Care | Payer: No Typology Code available for payment source | Attending: Emergency Medicine | Admitting: Emergency Medicine

## 2011-12-10 ENCOUNTER — Emergency Department (HOSPITAL_COMMUNITY): Payer: No Typology Code available for payment source

## 2011-12-10 DIAGNOSIS — M25532 Pain in left wrist: Secondary | ICD-10-CM

## 2011-12-10 DIAGNOSIS — M25562 Pain in left knee: Secondary | ICD-10-CM

## 2011-12-10 DIAGNOSIS — M542 Cervicalgia: Secondary | ICD-10-CM

## 2011-12-10 DIAGNOSIS — E119 Type 2 diabetes mellitus without complications: Secondary | ICD-10-CM | POA: Insufficient documentation

## 2011-12-10 DIAGNOSIS — I10 Essential (primary) hypertension: Secondary | ICD-10-CM | POA: Insufficient documentation

## 2011-12-10 DIAGNOSIS — M25569 Pain in unspecified knee: Secondary | ICD-10-CM | POA: Insufficient documentation

## 2011-12-10 DIAGNOSIS — M25539 Pain in unspecified wrist: Secondary | ICD-10-CM | POA: Insufficient documentation

## 2011-12-10 DIAGNOSIS — Z794 Long term (current) use of insulin: Secondary | ICD-10-CM | POA: Insufficient documentation

## 2011-12-10 DIAGNOSIS — T1490XA Injury, unspecified, initial encounter: Secondary | ICD-10-CM | POA: Insufficient documentation

## 2011-12-10 MED ORDER — NAPROXEN 375 MG PO TABS: 375.0000 mg | ORAL_TABLET | Freq: Two times a day (BID) | ORAL | Status: DC

## 2011-12-10 NOTE — ED Notes (Signed)
Patient with no complaints at this time. Respirations even and unlabored. Skin warm/dry. Discharge instructions reviewed with patient at this time. Patient given opportunity to voice concerns/ask questions. Patient discharged at this time and left Emergency Department with steady gait.   
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
## 2011-12-10 NOTE — ED Notes (Signed)
Patient c/o upper mid back pain when attempting to lift infant.  Dr. Adriana Simas made aware.
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
## 2011-12-14 ENCOUNTER — Ambulatory Visit: Payer: Medicaid Other | Admitting: Family Medicine

## 2011-12-16 ENCOUNTER — Ambulatory Visit: Payer: Medicaid Other | Admitting: Family Medicine

## 2011-12-16 ENCOUNTER — Encounter: Payer: Self-pay | Admitting: Family Medicine

## 2011-12-17 ENCOUNTER — Telehealth (INDEPENDENT_AMBULATORY_CARE_PROVIDER_SITE_OTHER): Payer: Self-pay | Admitting: *Deleted

## 2011-12-17 ENCOUNTER — Ambulatory Visit (INDEPENDENT_AMBULATORY_CARE_PROVIDER_SITE_OTHER): Payer: Medicaid Other | Admitting: Family Medicine

## 2011-12-17 ENCOUNTER — Telehealth: Payer: Self-pay | Admitting: Family Medicine

## 2011-12-17 ENCOUNTER — Encounter: Payer: Self-pay | Admitting: Family Medicine

## 2011-12-17 VITALS — BP 130/80 | HR 102 | Resp 15 | Ht 63.0 in | Wt 294.0 lb

## 2011-12-17 DIAGNOSIS — R350 Frequency of micturition: Secondary | ICD-10-CM

## 2011-12-17 DIAGNOSIS — N3000 Acute cystitis without hematuria: Secondary | ICD-10-CM

## 2011-12-17 DIAGNOSIS — M549 Dorsalgia, unspecified: Secondary | ICD-10-CM

## 2011-12-17 DIAGNOSIS — F329 Major depressive disorder, single episode, unspecified: Secondary | ICD-10-CM

## 2011-12-17 DIAGNOSIS — F411 Generalized anxiety disorder: Secondary | ICD-10-CM

## 2011-12-17 DIAGNOSIS — E109 Type 1 diabetes mellitus without complications: Secondary | ICD-10-CM

## 2011-12-17 DIAGNOSIS — I10 Essential (primary) hypertension: Secondary | ICD-10-CM

## 2011-12-17 DIAGNOSIS — J45909 Unspecified asthma, uncomplicated: Secondary | ICD-10-CM

## 2011-12-17 DIAGNOSIS — E785 Hyperlipidemia, unspecified: Secondary | ICD-10-CM

## 2011-12-17 DIAGNOSIS — E669 Obesity, unspecified: Secondary | ICD-10-CM

## 2011-12-17 DIAGNOSIS — F419 Anxiety disorder, unspecified: Secondary | ICD-10-CM

## 2011-12-17 LAB — POCT URINALYSIS DIPSTICK
Glucose, UA: NEGATIVE
Ketones, UA: NEGATIVE
Leukocytes, UA: NEGATIVE
Protein, UA: NEGATIVE
Urobilinogen, UA: 0.2

## 2011-12-17 MED ORDER — ALPRAZOLAM 1 MG PO TABS: 1.0000 mg | ORAL_TABLET | Freq: Every evening | ORAL | Status: AC | PRN

## 2011-12-17 NOTE — Telephone Encounter (Signed)
Final discharge letter to be completed and mailed certified 12/17/11.
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
## 2011-12-17 NOTE — Telephone Encounter (Signed)
PCP/Requesting MD: simpson  Name & DOB: Lisa Todd 07-19-2055     Procedure: tcs  Reason/Indication:  screening  Has patient had this procedure before?  no  If so, when, by whom and where?    Is there a family history of colon cancer?  no  Who?  What age when diagnosed?    Is patient diabetic?   yes      Does patient have prosthetic heart valve?  no  Do you have a pacemaker?  no  Has patient had joint replacement within last 12 months?  no  Is patient on Coumadin, Plavix and/or Aspirin? yes  Medications: asa 81 mg daily, adipex 37.5 daily, cardizem 420 mg daily, flexeril 10 mg @ bedtime prn, ventolin HFA prn, xanax 1 mg bid, vit d3 weekly, trilipix 135 mg daily, cipro bid, lotrimin 1% bid, diflucan 150 mg daily, lantus 35 units @ bedtime, novolog sliding scale, metformin 1000 mg bid, motrin 800 mg tid, nexium 40 mg daily, pantoprazole 40 mg daily, phentermine 37.5 mg daily, pravastatin 80 mg daily, QVAR inhaler 2 puffs bid, venlafaxine 37.5 mg daily   Allergies: nkda  Medication Adjustment: asa 2 days, 1/2 metformin day before, hold lantus, continue novolog  Procedure date & time: 01/14/12 at 12:20
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
## 2011-12-17 NOTE — Telephone Encounter (Signed)
My response is that it appears this pt is a source of disruption to the office , I will not fight this batttle anymore, so please proceed with discharge protocol. She did come in here yesterday , was late, as you are probably aware , and that was disruptive to staff and I am sure any patients who may have been in the lobby

## 2011-12-18 MED ORDER — KETOROLAC TROMETHAMINE 60 MG/2ML IM SOLN
60.0000 mg | Freq: Once | INTRAMUSCULAR | Status: AC
  Administered 2011-12-18: 60 mg via INTRAMUSCULAR

## 2011-12-18 NOTE — Telephone Encounter (Signed)
Agree 

## 2011-12-21 NOTE — Assessment & Plan Note (Signed)
Improved and stable on current medication , continue same 

## 2011-12-21 NOTE — Assessment & Plan Note (Signed)
Controlled, no change in medication  

## 2011-12-21 NOTE — Assessment & Plan Note (Signed)
toradol administered, and pt referred to PT

## 2011-12-21 NOTE — Assessment & Plan Note (Signed)
Deteriorated. Referral has already been made for consideration for management with surgical specialist, focusing on medication

## 2011-12-21 NOTE — Assessment & Plan Note (Signed)
Hyperlipidemia:Low fat diet discussed and encouraged.  Updated lab needed 

## 2011-12-21 NOTE — Assessment & Plan Note (Signed)
Controlled, no change in medication DASH diet and commitment to daily physical activity for a minimum of 30 minutes discussed and encouraged, as a part of hypertension management. The importance of attaining a healthy weight is also discussed.  

## 2011-12-21 NOTE — Assessment & Plan Note (Signed)
Uncontrolled ., and followed by endo 

## 2011-12-21 NOTE — Assessment & Plan Note (Signed)
Symptomatic, however urinalysis in the office is totally normal

## 2011-12-24 ENCOUNTER — Ambulatory Visit (HOSPITAL_COMMUNITY)
Admission: RE | Admit: 2011-12-24 | Discharge: 2011-12-24 | Disposition: A | Discharge status: Home / Self Care | Payer: No Typology Code available for payment source | Source: Ambulatory Visit | Attending: Family Medicine | Admitting: Family Medicine

## 2011-12-24 ENCOUNTER — Ambulatory Visit (HOSPITAL_COMMUNITY): Payer: Medicaid Other | Admitting: Physical Therapy

## 2011-12-24 DIAGNOSIS — M545 Low back pain, unspecified: Secondary | ICD-10-CM | POA: Insufficient documentation

## 2011-12-24 DIAGNOSIS — M6281 Muscle weakness (generalized): Secondary | ICD-10-CM | POA: Insufficient documentation

## 2011-12-24 DIAGNOSIS — IMO0001 Reserved for inherently not codable concepts without codable children: Secondary | ICD-10-CM | POA: Insufficient documentation

## 2011-12-24 DIAGNOSIS — M549 Dorsalgia, unspecified: Secondary | ICD-10-CM | POA: Diagnosis present

## 2011-12-24 DIAGNOSIS — M25569 Pain in unspecified knee: Secondary | ICD-10-CM | POA: Insufficient documentation
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
## 2011-12-25 ENCOUNTER — Other Ambulatory Visit: Payer: Self-pay | Admitting: Family Medicine

## 2011-12-28 ENCOUNTER — Ambulatory Visit: Payer: Medicaid Other | Attending: Neurology | Admitting: Sleep Medicine

## 2011-12-28 ENCOUNTER — Ambulatory Visit (HOSPITAL_COMMUNITY): Payer: Medicaid Other

## 2011-12-28 ENCOUNTER — Ambulatory Visit (HOSPITAL_COMMUNITY)
Admission: RE | Admit: 2011-12-28 | Discharge: 2011-12-28 | Disposition: A | Discharge status: Home / Self Care | Payer: Medicaid Other | Source: Ambulatory Visit | Attending: Family Medicine | Admitting: Family Medicine

## 2011-12-28 DIAGNOSIS — Z1231 Encounter for screening mammogram for malignant neoplasm of breast: Secondary | ICD-10-CM | POA: Insufficient documentation

## 2011-12-28 DIAGNOSIS — Z6841 Body Mass Index (BMI) 40.0 and over, adult: Secondary | ICD-10-CM | POA: Insufficient documentation

## 2011-12-28 DIAGNOSIS — Z139 Encounter for screening, unspecified: Secondary | ICD-10-CM

## 2011-12-28 DIAGNOSIS — G4730 Sleep apnea, unspecified: Secondary | ICD-10-CM

## 2011-12-28 DIAGNOSIS — G4733 Obstructive sleep apnea (adult) (pediatric): Secondary | ICD-10-CM | POA: Insufficient documentation

## 2011-12-29 ENCOUNTER — Inpatient Hospital Stay (HOSPITAL_COMMUNITY): Admission: RE | Admit: 2011-12-29 | Payer: Medicaid Other | Source: Ambulatory Visit

## 2011-12-30 ENCOUNTER — Inpatient Hospital Stay (HOSPITAL_COMMUNITY): Admission: RE | Admit: 2011-12-30 | Payer: Medicaid Other | Source: Ambulatory Visit | Admitting: Physical Therapy

## 2011-12-30 ENCOUNTER — Ambulatory Visit (HOSPITAL_COMMUNITY)
Admission: RE | Admit: 2011-12-30 | Discharge: 2011-12-30 | Disposition: A | Discharge status: Home / Self Care | Payer: No Typology Code available for payment source | Source: Ambulatory Visit | Attending: Family Medicine | Admitting: Family Medicine
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
## 2012-01-01 ENCOUNTER — Other Ambulatory Visit: Payer: Self-pay | Admitting: Family Medicine

## 2012-01-01 ENCOUNTER — Ambulatory Visit (HOSPITAL_COMMUNITY)
Admission: RE | Admit: 2012-01-01 | Discharge: 2012-01-01 | Disposition: A | Discharge status: Home / Self Care | Payer: No Typology Code available for payment source | Source: Ambulatory Visit | Attending: Family Medicine | Admitting: Family Medicine
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
## 2012-01-02 LAB — CBC WITH DIFFERENTIAL/PLATELET
Basophils Absolute: 0 10*3/uL (ref 0.0–0.1)
Lymphocytes Relative: 33 % (ref 12–46)
Neutro Abs: 3.2 10*3/uL (ref 1.7–7.7)
Platelets: 149 10*3/uL — ABNORMAL LOW (ref 150–400)
RBC: 4.34 MIL/uL (ref 3.87–5.11)
RDW: 14 % (ref 11.5–15.5)
WBC: 5.7 10*3/uL (ref 4.0–10.5)

## 2012-01-02 LAB — COMPREHENSIVE METABOLIC PANEL
ALT: 13 U/L (ref 0–35)
AST: 13 U/L (ref 0–37)
Albumin: 3.6 g/dL (ref 3.5–5.2)
Calcium: 8.8 mg/dL (ref 8.4–10.5)
Chloride: 104 mEq/L (ref 96–112)
Potassium: 4.3 mEq/L (ref 3.5–5.3)
Sodium: 136 mEq/L (ref 135–145)
Total Protein: 6.5 g/dL (ref 6.0–8.3)

## 2012-01-02 LAB — HEMOGLOBIN A1C
Hgb A1c MFr Bld: 8.5 % — ABNORMAL HIGH (ref <5.7)
Mean Plasma Glucose: 197 mg/dL — ABNORMAL HIGH (ref <117)

## 2012-01-02 LAB — LIPID PANEL: Total CHOL/HDL Ratio: 3.4 Ratio

## 2012-01-02 NOTE — Procedures (Signed)
[facility] [name] A. Gerilyn Pilgrim, MD     [email or web address]         Lisa Todd, Lisa Todd               ACCOUNT NO.:  [id number]  MEDICAL RECORD NO.:  [id number]          PATIENT TYPE:  OUT  LOCATION:  SLEEP LAB                     FACILITY:  [facility]  PHYSICIAN:  [name] A. Gerilyn Pilgrim, M.D. DATE OF BIRTH:  10/28/1955  DATE OF STUDY:  12/28/2011                           NOCTURNAL POLYSOMNOGRAM  REFERRING PHYSICIAN:  [name] A. Gerilyn Pilgrim, M.D.  INDICATION:  A 56 year old who presents with hypersomnia, fatigue, and snoring.  The study is being done to evaluate for obstructive sleep apnea syndrome.   MEDICATIONS:  Effexor, phentermine, insulin, Percocet, aspirin, Motrin Ventolin, Voltaren, Nexium, pravastatin, Flexeril, vitamin D, Trimpex, and insulin.  EPWORTH SLEEPINESS SCALE: 1.   BMI 52.  ARCHITECTURAL SUMMARY:  The total recording time is 411 minutes.  Sleep efficiency 73%.  Sleep latency 34 minutes.  REM latency 303 minutes. Stage N1 is 3.2%, N2 6%, and REM sleep 23%.  RESPIRATORY SUMMARY:  Baseline oxygen saturation is 98, lowest saturation 81 during REM sleep.  Diagnostic AHI is 10 with most of the event occurring almost exclusively during REM sleep.  The REM AHI is 60.  LIMB MOVEMENT SUMMARY:  PLM index 0.  ELECTROCARDIOGRAM SUMMARY:  Average heart rate is 75 with no significant dysrhythmias observed.  IMPRESSION:  Mild REM related obstructive sleep apnea syndrome.    [name] A. Gerilyn Pilgrim, M.D.    KAD/MEDQ  D:  01/02/2012 18:15:16  T:  01/02/2012 18:37:53  Job:  [id number]

## 2012-01-04 ENCOUNTER — Ambulatory Visit (HOSPITAL_COMMUNITY)
Admission: RE | Admit: 2012-01-04 | Discharge: 2012-01-04 | Disposition: A | Discharge status: Home / Self Care | Payer: No Typology Code available for payment source | Source: Ambulatory Visit | Attending: Family Medicine | Admitting: Family Medicine
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
## 2012-01-05 ENCOUNTER — Encounter (HOSPITAL_COMMUNITY): Payer: Self-pay | Admitting: *Deleted

## 2012-01-05 ENCOUNTER — Emergency Department (HOSPITAL_COMMUNITY)
Admission: EM | Admit: 2012-01-05 | Discharge: 2012-01-05 | Disposition: A | Discharge status: Home / Self Care | Payer: Medicaid Other | Attending: Emergency Medicine | Admitting: Emergency Medicine

## 2012-01-05 DIAGNOSIS — M129 Arthropathy, unspecified: Secondary | ICD-10-CM | POA: Insufficient documentation

## 2012-01-05 DIAGNOSIS — G4730 Sleep apnea, unspecified: Secondary | ICD-10-CM | POA: Insufficient documentation

## 2012-01-05 DIAGNOSIS — E669 Obesity, unspecified: Secondary | ICD-10-CM | POA: Insufficient documentation

## 2012-01-05 DIAGNOSIS — G8929 Other chronic pain: Secondary | ICD-10-CM | POA: Insufficient documentation

## 2012-01-05 DIAGNOSIS — Z794 Long term (current) use of insulin: Secondary | ICD-10-CM | POA: Insufficient documentation

## 2012-01-05 DIAGNOSIS — R252 Cramp and spasm: Secondary | ICD-10-CM | POA: Insufficient documentation

## 2012-01-05 DIAGNOSIS — Z79899 Other long term (current) drug therapy: Secondary | ICD-10-CM | POA: Insufficient documentation

## 2012-01-05 DIAGNOSIS — E785 Hyperlipidemia, unspecified: Secondary | ICD-10-CM | POA: Insufficient documentation

## 2012-01-05 DIAGNOSIS — I10 Essential (primary) hypertension: Secondary | ICD-10-CM | POA: Insufficient documentation

## 2012-01-05 DIAGNOSIS — E119 Type 2 diabetes mellitus without complications: Secondary | ICD-10-CM | POA: Insufficient documentation

## 2012-01-05 LAB — BASIC METABOLIC PANEL
CO2: 27 mEq/L (ref 19–32)
Calcium: 9.4 mg/dL (ref 8.4–10.5)
Creatinine, Ser: 0.61 mg/dL (ref 0.50–1.10)
GFR calc non Af Amer: >90 mL/min (ref >90)
Glucose, Bld: 89 mg/dL (ref 70–99)
Sodium: 134 mEq/L — ABNORMAL LOW (ref 135–145)

## 2012-01-05 LAB — CBC WITH DIFFERENTIAL/PLATELET
Eosinophils Absolute: 0.1 10*3/uL (ref 0.0–0.7)
Eosinophils Relative: 2 % (ref 0–5)
Hemoglobin: 12.8 g/dL (ref 12.0–15.0)
Lymphocytes Relative: 40 % (ref 12–46)
Lymphs Abs: 3 10*3/uL (ref 0.7–4.0)
Monocytes Absolute: 0.5 10*3/uL (ref 0.1–1.0)
Platelets: 164 10*3/uL (ref 150–400)
RDW: 13.3 % (ref 11.5–15.5)

## 2012-01-05 MED ORDER — LORAZEPAM 1 MG PO TABS
1.0000 mg | ORAL_TABLET | Freq: Once | ORAL | Status: AC
  Administered 2012-01-05: 1 mg via ORAL

## 2012-01-05 MED ORDER — LORAZEPAM 1 MG PO TABS
ORAL_TABLET | ORAL | Status: AC
  Filled 2012-01-05: qty 1

## 2012-01-05 MED ORDER — LORAZEPAM 1 MG PO TABS: 1.0000 mg | ORAL_TABLET | Freq: Three times a day (TID) | ORAL | Status: DC | PRN

## 2012-01-05 NOTE — ED Provider Notes (Signed)
History   This chart was scribed for Lisa Jakes, MD by Gerlean Ren. This patient was seen in room APA07/APA07 and the patient's care was started at 8:03AM.   CSN: [id number]  Arrival date & time 01/05/12  0750   First MD Initiated Contact with Patient 01/05/12 [phone]      Chief Complaint  Patient presents with  . Abdominal Pain  . Leg Pain    (Consider location/radiation/quality/duration/timing/severity/associated sxs/prior treatment) Patient is a 56 y.o. female presenting with abdominal pain and leg pain. The history is provided by the patient. No language interpreter was used.  Abdominal Pain The primary symptoms of the illness include abdominal pain and shortness of breath. The primary symptoms of the illness do not include fever.  Symptoms associated with the illness do not include back pain.  Leg Pain    Lisa Todd is a 56 y.o. female who presents to the Emergency Department complaining of constant cramping and pain beginning 12 hours ago in bilateral feet radiating up to bilateral calves, but neither pain nor cramping radiates into thighs.  Pain during cramps is described as a 10 out of 10 and prevented pt from sleeping last night.  Pt has had similar episodes before, but current associated pain is most severe occurrence.  PCP Dr. Syliva Overman has previously given her Flexeril with improvements to symptoms, but pt has no medications prescribed to take at home for symptoms.  Pt had potassium tested in most recent visit to PCP 6 months ago with no abnormal findings.  Pt also reports abdominal cramping, but she states that this is most likely due to anxiety from leg pain and lack of sleep.  Pt has h/o chronic back pain, depression, HTN, and DM.  Pt is obese.  Pt has liraglutide allergy.  Pt denies tobacco and alcohol use.   Past Medical History  Diagnosis Date  . Chronic back pain   . Hyperlipidemia   . Asthma   . Sleep apnea   . Depression   . Obesity   .  Hypertension   . IDDM (insulin dependent diabetes mellitus)   . Diabetes mellitus   . Arthritis     Past Surgical History  Procedure Date  . Vesicovaginal fistula closure w/ tah 2002  . Knee left arthroscopy 2004  . Right knee surgery 2007  . Bladder repair w/ cesarean section 1989  . Right rotator cuff surgery 11/26/2009  . Abdominal hysterectomy     Family History  Problem Relation Age of Onset  . Breast cancer Mother   . Diabetes Mother   . Hypertension Mother   . Asthma Father   . Diabetes Father   . Hypertension Father   . Diabetes Brother   . Hypertension Brother   . Diabetes Sister   . Hypertension Sister     History  Substance Use Topics  . Smoking status: Never Smoker   . Smokeless tobacco: Not on file  . Alcohol Use: No    No OB history provided.  Review of Systems  Constitutional: Negative for fever.  HENT: Negative for congestion and sore throat.   Eyes: Negative for visual disturbance.  Respiratory: Positive for shortness of breath.   Cardiovascular: Negative for chest pain.  Gastrointestinal: Positive for abdominal pain.  Musculoskeletal: Negative for myalgias and back pain.  Skin: Negative for rash.  Neurological: Negative for headaches.    Allergies  Liraglutide  Home Medications   Current Outpatient Rx  Name Route Sig Dispense Refill  .  ALBUTEROL SULFATE HFA 108 (90 BASE) MCG/ACT IN AERS Inhalation Inhale 2 puffs into the lungs every 6 (six) hours as needed. Shortness of Breath    . ALPRAZOLAM 1 MG PO TABS Oral Take 1 tablet (1 mg total) by mouth at bedtime as needed for sleep. 30 tablet 0    Dose reduction effective 12/17/2011  . ASPIRIN EC 81 MG PO TBEC Oral Take 81 mg by mouth daily.    . BECLOMETHASONE DIPROPIONATE 40 MCG/ACT IN AERS Inhalation Inhale 1 puff into the lungs 2 (two) times daily.     Marland Kitchen VITAMIN D3 50000 UNITS PO CAPS Oral Take 50,000 Units by mouth once a week. On Mondays    . CHOLINE FENOFIBRATE 135 MG PO CPDR Oral Take 1  capsule (135 mg total) by mouth at bedtime. 30 capsule 3  . DICLOFENAC SODIUM 1 % TD GEL Topical Apply 1 application topically at bedtime. For arthritis pain    . DILTIAZEM HCL ER COATED BEADS 240 MG PO CP24 Oral Take 240 mg by mouth daily.    Marland Kitchen ESOMEPRAZOLE MAGNESIUM 40 MG PO CPDR Oral Take 40 mg by mouth daily before breakfast.    . IBUPROFEN 800 MG PO TABS Oral Take 400 mg by mouth every 6 (six) hours as needed. Pain    . INSULIN ASPART 100 UNIT/ML [place] SOLN Subcutaneous Inject 9-20 Units into the skin 3 (three) times daily before meals. For sugar per sliding scale    . INSULIN GLARGINE 100 UNIT/ML [place] SOLN Subcutaneous Inject 35 Units into the skin at bedtime.    Marland Kitchen ENERGY BOOSTER PO Oral Take 1 tablet by mouth daily.    Marland Kitchen NAPROXEN 375 MG PO TABS Oral Take 1 tablet (375 mg total) by mouth 2 (two) times daily. 20 tablet 0  . OXYCODONE-ACETAMINOPHEN 10-325 MG PO TABS Oral Take 1 tablet by mouth every 6 (six) hours as needed. For pain    . PHENTERMINE HCL 37.5 MG PO CAPS Oral Take 37.5 mg by mouth daily. One cap daily    . PRAVASTATIN SODIUM 80 MG PO TABS Oral Take 1 tablet (80 mg total) by mouth every evening. 30 tablet 3    Dose increase effective 02/18/2011  . VENLAFAXINE HCL ER 75 MG PO CP24 Oral Take 75 mg by mouth daily.    Marland Kitchen LORAZEPAM 1 MG PO TABS Oral Take 1 tablet (1 mg total) by mouth 3 (three) times daily as needed for anxiety. 10 tablet 0  . PEG-KCL-NACL-NASULF-NA ASC-C 100 G PO SOLR Oral Take 1 kit (100 g total) by mouth once. 1 kit 0    BP 155/69  Pulse 96  Temp 98.3 F (36.8 C) (Oral)  Resp 20  Ht 5\' 3"  (1.6 m)  Wt 285 lb (129.275 kg)  BMI 50.49 kg/m2  SpO2 100%  Physical Exam  Nursing note and vitals reviewed. Constitutional: She is oriented to person, place, and time. She appears well-developed and well-nourished.  HENT:  Head: Normocephalic and atraumatic.  Eyes: Conjunctivae normal and EOM are normal.  Neck: Normal range of motion. Neck supple. No tracheal deviation  present.  Cardiovascular: Normal rate, regular rhythm and normal heart sounds.   No murmur heard.      1 second capillary refill in toes.  Pulmonary/Chest: Effort normal and breath sounds normal. She has no wheezes.  Abdominal: Soft. Bowel sounds are normal. There is no tenderness.  Musculoskeletal: Normal range of motion.       Trace pitting edema in bilateral distal  legs.   Muscles are soft.  Neurological: She is alert and oriented to person, place, and time. No cranial nerve deficit.  Skin: Skin is warm and dry.  Psychiatric: She has a normal mood and affect.    ED Course  Procedures (including critical care time) DIAGNOSTIC STUDIES: Oxygen Saturation is 100% on room air, normal by my interpretation.    COORDINATION OF CARE: 8:10AM- Discussed treatment plan with pt at bedside and pt agreed to plan. 9:22AM- Re-checked and informed pt that labs appeared normal.  Pt appears to be in much less discomfort.  Prescribed ativan.  Advised following up with PCP.   Labs Reviewed  BASIC METABOLIC PANEL - Abnormal; Notable for the following:    Sodium 134 (*)     All other components within normal limits  CBC WITH DIFFERENTIAL   No results found. Results for orders placed during the hospital encounter of 01/05/12  CBC WITH DIFFERENTIAL      Component Value Range   WBC 7.6  4.0 - 10.5 K/uL   RBC 4.41  3.87 - 5.11 MIL/uL   Hemoglobin 12.8  12.0 - 15.0 g/dL   HCT 96.0  45.4 - 09.8 %   MCV 86.8  78.0 - 100.0 fL   MCH 29.0  26.0 - 34.0 pg   MCHC 33.4  30.0 - 36.0 g/dL   RDW 11.9  14.7 - 82.9 %   Platelets 164  150 - 400 K/uL   Neutrophils Relative 52  43 - 77 %   Neutro Abs 4.0  1.7 - 7.7 K/uL   Lymphocytes Relative 40  12 - 46 %   Lymphs Abs 3.0  0.7 - 4.0 K/uL   Monocytes Relative 6  3 - 12 %   Monocytes Absolute 0.5  0.1 - 1.0 K/uL   Eosinophils Relative 2  0 - 5 %   Eosinophils Absolute 0.1  0.0 - 0.7 K/uL   Basophils Relative 0  0 - 1 %   Basophils Absolute 0.0  0.0 - 0.1  K/uL  BASIC METABOLIC PANEL      Component Value Range   Sodium 134 (*) 135 - 145 mEq/L   Potassium 3.6  3.5 - 5.1 mEq/L   Chloride 98  96 - 112 mEq/L   CO2 27  19 - 32 mEq/L   Glucose, Bld 89  70 - 99 mg/dL   BUN 16  6 - 23 mg/dL   Creatinine, Ser 5.62  0.50 - 1.10 mg/dL   Calcium 9.4  8.4 - 13.0 mg/dL   GFR calc non Af Amer >90  >90 mL/min   GFR calc Af Amer >90  >90 mL/min     1. Leg cramps       MDM  Workup for the leg cramps shows no significant electrolyte abnormalities potassium is normal calcium is normal. Patient has a history of diabetes blood sugar is good today. Patient will followup with her primary care Dr. Maryclare Labrador treat with a Ativan for the leg cramps over the next few days. Patient knows not to take that with Xanax.     I personally performed the services described in this documentation, which was scribed in my presence. The recorded information has been reviewed and considered.          Lisa Jakes, MD 01/05/12 [phone]

## 2012-01-05 NOTE — ED Notes (Signed)
Pt stated that she would like her medication administered as an injection and not a pill. Doctor notified about pt.'s concern. Doctor still preferred pill and pt notified and she requested to talk doctor. Doctor went in to explain to pt, pt agreed to po medication.

## 2012-01-05 NOTE — ED Notes (Signed)
Pt c/o leg cramps that radiate up both legs into abd area, pt states that she has "had this for awhile" worse last night.

## 2012-01-06 ENCOUNTER — Ambulatory Visit (INDEPENDENT_AMBULATORY_CARE_PROVIDER_SITE_OTHER): Payer: Medicaid Other | Admitting: Family Medicine

## 2012-01-06 ENCOUNTER — Inpatient Hospital Stay (HOSPITAL_COMMUNITY): Admission: RE | Admit: 2012-01-06 | Payer: Medicaid Other | Source: Ambulatory Visit

## 2012-01-06 ENCOUNTER — Encounter: Payer: Self-pay | Admitting: Family Medicine

## 2012-01-06 VITALS — BP 150/88 | HR 102 | Resp 18 | Ht 63.0 in | Wt 296.0 lb

## 2012-01-06 DIAGNOSIS — F411 Generalized anxiety disorder: Secondary | ICD-10-CM

## 2012-01-06 DIAGNOSIS — J45909 Unspecified asthma, uncomplicated: Secondary | ICD-10-CM

## 2012-01-06 DIAGNOSIS — E669 Obesity, unspecified: Secondary | ICD-10-CM

## 2012-01-06 DIAGNOSIS — Z23 Encounter for immunization: Secondary | ICD-10-CM

## 2012-01-06 DIAGNOSIS — I10 Essential (primary) hypertension: Secondary | ICD-10-CM

## 2012-01-06 DIAGNOSIS — R0789 Other chest pain: Secondary | ICD-10-CM

## 2012-01-06 DIAGNOSIS — E109 Type 1 diabetes mellitus without complications: Secondary | ICD-10-CM

## 2012-01-06 DIAGNOSIS — M25569 Pain in unspecified knee: Secondary | ICD-10-CM

## 2012-01-06 DIAGNOSIS — M62838 Other muscle spasm: Secondary | ICD-10-CM

## 2012-01-06 MED ORDER — CYCLOBENZAPRINE HCL 10 MG PO TABS: ORAL_TABLET | ORAL | Status: DC

## 2012-01-06 NOTE — Patient Instructions (Addendum)
Please stop taking cholesterol medication at this time , and see if the cramps get better.If no different in 2 weeks, I suggest you resume the cholesterol medication as before  From labs , I see no abnormality to explain the cause of the cramps, however a few more tests are being added, CPK, CKMB, calcium, magnesium  I am sending in a muscle relaxant for bedtime used for the next 2 weeks.  You are referred to cardiology for further assessment, the EKG today does not suggest any heart damage  I will make 1 phone call on your behalf to see if I can identify a new PCP as we discussed.

## 2012-01-06 NOTE — Progress Notes (Signed)
  Subjective:    Patient ID: Lisa Todd, female    DOB: 1956/01/15, 56 y.o.   MRN: [id number]  HPI Pt in for ed follow up of Ed visit for severe leg cramps as well as abdominal cramps. States this has been going on 4 months, intermittent use of lipid meds States she feels as though she is having a heart attack when it occurs. Allso c/o  Intermittent chest pain with light headedness, has multiple cardiac risk factors, feels as though she is being slighted by Ed and also not happy with recent d/c stating she was not late on  The day stated as late that 2 to 3 people were ahead of her. States no one is taking medicaid , she cannot find a doc, anxious , tearful upset. Ha had mammogram and has colonoscopy upcoming lood sugars continue to fluctuate and remain uncontrolled, needs to be more consistent with diet and medications per endo Frustrated with weight , but intends to continue to work on this     Review of Systems See HPI Denies recent fever or chills. Denies sinus pressure, nasal congestion, ear pain or sore throat. Denies chest congestion, productive cough or wheezing. Denies abdominal pain, nausea, vomiting,diarrhea or constipation.   Denies dysuria, frequency, hesitancy or incontinence. Chronic joint pain managed by orhto, now has muscle cramps Denies headaches, seizures, numbness, or tingling.  Denies skin break down or rash.        Objective:   Physical Exam  Patient alert and oriented and in no cardiopulmonary distress.  HEENT: No facial asymmetry, EOMI, no sinus tenderness,  oropharynx pink and moist.  Neck supple no adenopathy.  Chest: Clear to auscultation bilaterally.  CVS: S1, S2 no murmurs, no S3.  ABD: Soft non tender. Bowel sounds normal.  Ext: No edema  MS: decreased ROM spine, shoulders, hips and knees.  Skin: Intact, no ulcerations or rash noted.  Psych: Good eye contact, normal affect. Memory intact  anxious and  depressed appearing.  CNS: CN  2-12 intact, power, tone and sensation normal throughout.        Assessment & Plan:

## 2012-01-08 ENCOUNTER — Ambulatory Visit: Payer: Medicaid Other | Admitting: Cardiovascular Disease

## 2012-01-08 ENCOUNTER — Ambulatory Visit (HOSPITAL_COMMUNITY): Payer: Medicaid Other | Admitting: Physical Therapy

## 2012-01-10 NOTE — Assessment & Plan Note (Signed)
Uncontrolled muscle spasms , was recently in the Ed, need t add muscle enzyme tests , mg and ca Hold lipid lowering meds for now

## 2012-01-10 NOTE — Assessment & Plan Note (Signed)
In creased due to recent termination from practice, states cannot find a Doc, will assist if possible

## 2012-01-10 NOTE — Assessment & Plan Note (Signed)
Recent episodes of chest pain radiating to neck, no specific aggravating or relieving factors noted. Multiple cardiac risk factors, EKG in office shows no evidence of acute ischemia, will refer to cardiology for re evaluation as deemed necessary

## 2012-01-10 NOTE — Assessment & Plan Note (Signed)
Deteriorated. Patient re-educated about  the importance of commitment to a  minimum of 150 minutes of exercise per week. The importance of healthy food choices with portion control discussed. Encouraged to start a food diary, count calories and to consider  joining a support group. Sample diet sheets offered. Goals set by the patient for the next several months.    

## 2012-01-10 NOTE — Assessment & Plan Note (Signed)
Has had surgical intervention. Receives pain med from ortho, and states she is cutting back on this

## 2012-01-10 NOTE — Assessment & Plan Note (Addendum)
Stable no recent flares 

## 2012-01-10 NOTE — Assessment & Plan Note (Addendum)
Uncontrolled, may need med adjustment, will defer to new PCP, was within normal at last 2 visits DASH diet and commitment to daily physical activity for a minimum of 30 minutes discussed and encouraged, as a part of hypertension management. The importance of attaining a healthy weight is also discussed.

## 2012-01-10 NOTE — Assessment & Plan Note (Signed)
Uncontrolled, pt encouraged to be diligent with treatment plan re endo to prevent organ damage

## 2012-01-11 ENCOUNTER — Ambulatory Visit (HOSPITAL_COMMUNITY): Payer: Medicaid Other | Admitting: Physical Therapy

## 2012-01-13 ENCOUNTER — Ambulatory Visit (HOSPITAL_COMMUNITY)
Admission: RE | Admit: 2012-01-13 | Discharge: 2012-01-13 | Disposition: A | Discharge status: Home / Self Care | Payer: No Typology Code available for payment source | Source: Ambulatory Visit | Attending: Family Medicine | Admitting: Family Medicine
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
## 2012-01-14 ENCOUNTER — Encounter (HOSPITAL_COMMUNITY): Admission: RE | Payer: Self-pay | Source: Ambulatory Visit

## 2012-01-14 ENCOUNTER — Ambulatory Visit (HOSPITAL_COMMUNITY): Admission: RE | Admit: 2012-01-14 | Payer: Medicaid Other | Source: Ambulatory Visit | Admitting: Internal Medicine

## 2012-01-14 SURGERY — COLONOSCOPY
Anesthesia: Moderate Sedation

## 2012-01-15 ENCOUNTER — Ambulatory Visit (HOSPITAL_COMMUNITY)
Admission: RE | Admit: 2012-01-15 | Discharge: 2012-01-15 | Disposition: A | Discharge status: Home / Self Care | Payer: No Typology Code available for payment source | Source: Ambulatory Visit | Attending: Family Medicine | Admitting: Family Medicine

## 2012-01-15 MED ORDER — HYDROMORPHONE HCL PF 1 MG/ML IJ SOLN
INTRAMUSCULAR | Status: AC
  Filled 2012-01-15: qty 1
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
## 2012-01-20 ENCOUNTER — Ambulatory Visit (HOSPITAL_COMMUNITY): Payer: Medicaid Other | Admitting: *Deleted

## 2012-01-22 ENCOUNTER — Ambulatory Visit (HOSPITAL_COMMUNITY): Payer: Medicaid Other | Admitting: *Deleted

## 2012-01-25 ENCOUNTER — Encounter: Payer: Self-pay | Admitting: Family Medicine

## 2012-01-26 ENCOUNTER — Ambulatory Visit (HOSPITAL_COMMUNITY): Payer: Medicaid Other | Admitting: Physical Therapy

## 2012-01-27 ENCOUNTER — Inpatient Hospital Stay (HOSPITAL_COMMUNITY): Admission: RE | Admit: 2012-01-27 | Payer: Medicaid Other | Source: Ambulatory Visit | Admitting: Physical Therapy

## 2012-02-01 ENCOUNTER — Emergency Department (HOSPITAL_COMMUNITY)
Admission: EM | Admit: 2012-02-01 | Discharge: 2012-02-01 | Disposition: A | Discharge status: Home / Self Care | Payer: Medicaid Other | Attending: Emergency Medicine | Admitting: Emergency Medicine

## 2012-02-01 ENCOUNTER — Encounter (HOSPITAL_COMMUNITY): Payer: Self-pay | Admitting: *Deleted

## 2012-02-01 ENCOUNTER — Emergency Department (HOSPITAL_COMMUNITY): Payer: Medicaid Other

## 2012-02-01 DIAGNOSIS — R079 Chest pain, unspecified: Secondary | ICD-10-CM | POA: Insufficient documentation

## 2012-02-01 DIAGNOSIS — R062 Wheezing: Secondary | ICD-10-CM | POA: Insufficient documentation

## 2012-02-01 DIAGNOSIS — Z794 Long term (current) use of insulin: Secondary | ICD-10-CM | POA: Insufficient documentation

## 2012-02-01 DIAGNOSIS — R05 Cough: Secondary | ICD-10-CM | POA: Insufficient documentation

## 2012-02-01 DIAGNOSIS — J069 Acute upper respiratory infection, unspecified: Secondary | ICD-10-CM | POA: Insufficient documentation

## 2012-02-01 DIAGNOSIS — E119 Type 2 diabetes mellitus without complications: Secondary | ICD-10-CM | POA: Insufficient documentation

## 2012-02-01 DIAGNOSIS — I10 Essential (primary) hypertension: Secondary | ICD-10-CM | POA: Insufficient documentation

## 2012-02-01 DIAGNOSIS — R059 Cough, unspecified: Secondary | ICD-10-CM | POA: Insufficient documentation

## 2012-02-01 DIAGNOSIS — R0602 Shortness of breath: Secondary | ICD-10-CM | POA: Insufficient documentation

## 2012-02-01 MED ORDER — PREDNISONE 20 MG PO TABS
60.0000 mg | ORAL_TABLET | Freq: Once | ORAL | Status: AC
  Administered 2012-02-01: 60 mg via ORAL
  Filled 2012-02-01: qty 3

## 2012-02-01 MED ORDER — GUAIFENESIN-CODEINE 100-10 MG/5ML PO SOLN
5.0000 mL | Freq: Once | ORAL | Status: AC
  Administered 2012-02-01: 5 mL via ORAL
  Filled 2012-02-01: qty 5

## 2012-02-01 MED ORDER — ONDANSETRON HCL 4 MG PO TABS
4.0000 mg | ORAL_TABLET | Freq: Once | ORAL | Status: AC
  Administered 2012-02-01: 4 mg via ORAL

## 2012-02-01 MED ORDER — ALBUTEROL SULFATE (5 MG/ML) 0.5% IN NEBU
2.5000 mg | INHALATION_SOLUTION | Freq: Once | RESPIRATORY_TRACT | Status: AC
  Administered 2012-02-01: 2.5 mg via RESPIRATORY_TRACT
  Filled 2012-02-01: qty 0.5

## 2012-02-01 MED ORDER — ALBUTEROL SULFATE HFA 108 (90 BASE) MCG/ACT IN AERS: 1.0000 | INHALATION_SPRAY | Freq: Four times a day (QID) | RESPIRATORY_TRACT | Status: DC | PRN

## 2012-02-01 MED ORDER — IPRATROPIUM BROMIDE 0.02 % IN SOLN
0.5000 mg | Freq: Once | RESPIRATORY_TRACT | Status: AC
  Administered 2012-02-01: 0.5 mg via RESPIRATORY_TRACT
  Filled 2012-02-01: qty 2.5

## 2012-02-01 MED ORDER — PREDNISONE 10 MG PO TABS: 20.0000 mg | ORAL_TABLET | Freq: Every day | ORAL | Status: DC

## 2012-02-01 MED ORDER — ALBUTEROL SULFATE (5 MG/ML) 0.5% IN NEBU: 2.5000 mg | INHALATION_SOLUTION | Freq: Once | RESPIRATORY_TRACT | Status: DC

## 2012-02-01 MED ORDER — ONDANSETRON HCL 4 MG/2ML IJ SOLN: 4.0000 mg | Freq: Once | INTRAMUSCULAR | Status: DC

## 2012-02-01 MED ORDER — HYDROCODONE-ACETAMINOPHEN 5-325 MG PO TABS
1.0000 | ORAL_TABLET | Freq: Once | ORAL | Status: AC
  Administered 2012-02-01: 1 via ORAL
  Filled 2012-02-01: qty 1

## 2012-02-01 NOTE — ED Provider Notes (Signed)
History     CSN: [id number]  Arrival date & time 02/01/12  0401   First MD Initiated Contact with Patient 02/01/12 [phone]      Chief Complaint  Patient presents with  . Chest Pain  . Sore Throat    (Consider location/radiation/quality/duration/timing/severity/associated sxs/prior treatment) HPI  Lisa Todd is a 56 y.o. female who presents to the Emergency Department complaining of upper respiratory infection symptoms x 2 days including sneezing, coughing, sore throat, chills. She has been taking nyquil and dayquil without relief.   PCP Dr. Lodema Hong  Past Medical History  Diagnosis Date  . Chronic back pain   . Hyperlipidemia   . Asthma   . Sleep apnea   . Depression   . Obesity   . Hypertension   . IDDM (insulin dependent diabetes mellitus)   . Diabetes mellitus   . Arthritis     Past Surgical History  Procedure Date  . Vesicovaginal fistula closure w/ tah 2002  . Knee left arthroscopy 2004  . Right knee surgery 2007  . Bladder repair w/ cesarean section 1989  . Right rotator cuff surgery 11/26/2009  . Abdominal hysterectomy     Family History  Problem Relation Age of Onset  . Breast cancer Mother   . Diabetes Mother   . Hypertension Mother   . Asthma Father   . Diabetes Father   . Hypertension Father   . Diabetes Brother   . Hypertension Brother   . Diabetes Sister   . Hypertension Sister     History  Substance Use Topics  . Smoking status: Never Smoker   . Smokeless tobacco: Not on file  . Alcohol Use: No    OB History    Grav Para Term Preterm Abortions TAB SAB Ect Mult Living                  Review of Systems  Constitutional: Negative for fever.       10 Systems reviewed and are negative for acute change except as noted in the HPI.  HENT: Positive for ear pain, congestion, sore throat, rhinorrhea and sneezing.   Eyes: Negative for discharge and redness.  Respiratory: Positive for cough and shortness of breath.   Cardiovascular:  Negative for chest pain.  Gastrointestinal: Negative for vomiting and abdominal pain.  Musculoskeletal: Negative for back pain.  Skin: Negative for rash.  Neurological: Negative for syncope, numbness and headaches.  Psychiatric/Behavioral:       No behavior change.    Allergies  Liraglutide  Home Medications   Current Outpatient Rx  Name Route Sig Dispense Refill  . ALBUTEROL SULFATE HFA 108 (90 BASE) MCG/ACT IN AERS Inhalation Inhale 2 puffs into the lungs every 6 (six) hours as needed. Shortness of Breath    . ALPRAZOLAM 1 MG PO TABS Oral Take 1 tablet (1 mg total) by mouth at bedtime as needed for sleep. 30 tablet 0    Dose reduction effective 12/17/2011  . ASPIRIN EC 81 MG PO TBEC Oral Take 81 mg by mouth daily.    . BECLOMETHASONE DIPROPIONATE 40 MCG/ACT IN AERS Inhalation Inhale 1 puff into the lungs 2 (two) times daily.     Marland Kitchen VITAMIN D3 50000 UNITS PO CAPS Oral Take 50,000 Units by mouth once a week. On Mondays    . CYCLOBENZAPRINE HCL 10 MG PO TABS  One tablet at bedtime, as needed, for muscle spasm 30 tablet 0  . DICLOFENAC SODIUM 1 % TD GEL Topical Apply  1 application topically at bedtime. For arthritis pain    . DILTIAZEM HCL ER COATED BEADS 240 MG PO CP24 Oral Take 240 mg by mouth daily.    Marland Kitchen ESOMEPRAZOLE MAGNESIUM 40 MG PO CPDR Oral Take 40 mg by mouth daily before breakfast.    . IBUPROFEN 800 MG PO TABS Oral Take 400 mg by mouth every 6 (six) hours as needed. Pain    . INSULIN ASPART 100 UNIT/ML [place] SOLN Subcutaneous Inject 9-20 Units into the skin 3 (three) times daily before meals. For sugar per sliding scale    . INSULIN GLARGINE 100 UNIT/ML [place] SOLN Subcutaneous Inject 35 Units into the skin at bedtime.    Marland Kitchen LORAZEPAM 1 MG PO TABS Oral Take 1 tablet (1 mg total) by mouth 3 (three) times daily as needed for anxiety. 10 tablet 0  . ENERGY BOOSTER PO Oral Take 1 tablet by mouth daily.    Marland Kitchen NAPROXEN 375 MG PO TABS Oral Take 1 tablet (375 mg total) by mouth 2 (two) times  daily. 20 tablet 0  . OXYCODONE-ACETAMINOPHEN 10-325 MG PO TABS Oral Take 1 tablet by mouth every 6 (six) hours as needed. For pain    . PEG-KCL-NACL-NASULF-NA ASC-C 100 G PO SOLR Oral Take 1 kit (100 g total) by mouth once. 1 kit 0  . VENLAFAXINE HCL ER 75 MG PO CP24 Oral Take 75 mg by mouth daily.      BP 158/73  Pulse 102  Temp 99.2 F (37.3 C) (Oral)  Resp 24  Ht 5\' 3"  (1.6 m)  Wt 283 lb (128.368 kg)  BMI 50.13 kg/m2  SpO2 98%  Physical Exam  Nursing note and vitals reviewed. Constitutional: She is oriented to person, place, and time. She appears well-developed and well-nourished.       Awake, alert, nontoxic appearance.  HENT:  Head: Normocephalic and atraumatic.  Right Ear: External ear normal.  Left Ear: External ear normal.  Nose: Nose normal.  Mouth/Throat: Oropharynx is clear and moist.  Eyes: Conjunctivae normal and EOM are normal. Pupils are equal, round, and reactive to light. Right eye exhibits no discharge. Left eye exhibits no discharge.  Neck: Normal range of motion. Neck supple.  Cardiovascular: Normal heart sounds.   Pulmonary/Chest: Effort normal. She exhibits no tenderness.       Coughing, expiratory wheezing  Abdominal: Soft. There is no tenderness. There is no rebound.  Musculoskeletal: Normal range of motion. She exhibits no tenderness.       Baseline ROM, no obvious new focal weakness.  Neurological: She is alert and oriented to person, place, and time.       Mental status and motor strength appears baseline for patient and situation.  Skin: No rash noted.  Psychiatric: She has a normal mood and affect.    ED Course  Procedures (including critical care time)  Results for orders placed during the hospital encounter of 02/01/12  RAPID STREP SCREEN      Component Value Range   Streptococcus, Group A Screen (Direct) NEGATIVE  NEGATIVE   Dg Chest Portable 1 View  02/01/2012  *RADIOLOGY REPORT*  Clinical Data: Cough, chest pain, and sore throat for  3 days.  PORTABLE CHEST - 1 VIEW  Comparison: 02/20/2011  Findings: Shallow inspiration.  Normal heart size and pulmonary vascularity.  No focal airspace consolidation in the lungs.  No blunting of costophrenic angles.  No pneumothorax.  Mediastinal contours appear intact.  Stable appearance since previous study, allowing for differences in  technique.  IMPRESSION: Shallow inspiration.  No active pulmonary disease.   Original Report Authenticated By: Marlon Pel, M.D.      No diagnosis found.    MDM  Patient with URI symptoms. Given albuterol/atrovent, prednisone, robitussin AC. Chest xray without acute findings, strep negative.  Pt feels improved after observation and/or treatment in ED.Pt stable in ED with no significant deterioration in condition.The patient appears reasonably screened and/or stabilized for discharge and I doubt any other medical condition or other [facility] requiring further screening, evaluation, or treatment in the ED at this time prior to discharge.  MDM Reviewed: nursing note and vitals Interpretation: x-ray and labs            [employer]. Colon Branch, MD 02/01/12 [phone]

## 2012-02-01 NOTE — ED Notes (Addendum)
Pt reporting some SOB, chest pain and states she thinks she is developing strep throat.  Reports symptoms x2 days. Reports worsening of symptoms. Reports been taking Nyquil and Dayquil with no relief. Reports productive cough with thick sputum.

## 2012-02-17 ENCOUNTER — Other Ambulatory Visit: Payer: Self-pay | Admitting: Family Medicine

## 2012-02-25 ENCOUNTER — Ambulatory Visit: Payer: Medicaid Other | Admitting: Cardiovascular Disease

## 2012-03-26 ENCOUNTER — Encounter (HOSPITAL_COMMUNITY): Payer: Self-pay | Admitting: Emergency Medicine

## 2012-03-26 ENCOUNTER — Emergency Department (HOSPITAL_COMMUNITY)
Admission: EM | Admit: 2012-03-26 | Discharge: 2012-03-26 | Disposition: A | Discharge status: Home / Self Care | Payer: Medicaid Other | Attending: Emergency Medicine | Admitting: Emergency Medicine

## 2012-03-26 DIAGNOSIS — R300 Dysuria: Secondary | ICD-10-CM | POA: Insufficient documentation

## 2012-03-26 DIAGNOSIS — E785 Hyperlipidemia, unspecified: Secondary | ICD-10-CM | POA: Insufficient documentation

## 2012-03-26 DIAGNOSIS — Z79899 Other long term (current) drug therapy: Secondary | ICD-10-CM | POA: Insufficient documentation

## 2012-03-26 DIAGNOSIS — J45909 Unspecified asthma, uncomplicated: Secondary | ICD-10-CM | POA: Insufficient documentation

## 2012-03-26 DIAGNOSIS — E669 Obesity, unspecified: Secondary | ICD-10-CM | POA: Insufficient documentation

## 2012-03-26 DIAGNOSIS — R109 Unspecified abdominal pain: Secondary | ICD-10-CM | POA: Insufficient documentation

## 2012-03-26 DIAGNOSIS — I10 Essential (primary) hypertension: Secondary | ICD-10-CM | POA: Insufficient documentation

## 2012-03-26 DIAGNOSIS — M545 Low back pain, unspecified: Secondary | ICD-10-CM | POA: Insufficient documentation

## 2012-03-26 DIAGNOSIS — G4730 Sleep apnea, unspecified: Secondary | ICD-10-CM | POA: Insufficient documentation

## 2012-03-26 DIAGNOSIS — Z794 Long term (current) use of insulin: Secondary | ICD-10-CM | POA: Insufficient documentation

## 2012-03-26 DIAGNOSIS — G8929 Other chronic pain: Secondary | ICD-10-CM | POA: Insufficient documentation

## 2012-03-26 DIAGNOSIS — M129 Arthropathy, unspecified: Secondary | ICD-10-CM | POA: Insufficient documentation

## 2012-03-26 DIAGNOSIS — F329 Major depressive disorder, single episode, unspecified: Secondary | ICD-10-CM | POA: Insufficient documentation

## 2012-03-26 DIAGNOSIS — F3289 Other specified depressive episodes: Secondary | ICD-10-CM | POA: Insufficient documentation

## 2012-03-26 DIAGNOSIS — Z7982 Long term (current) use of aspirin: Secondary | ICD-10-CM | POA: Insufficient documentation

## 2012-03-26 DIAGNOSIS — E109 Type 1 diabetes mellitus without complications: Secondary | ICD-10-CM | POA: Insufficient documentation

## 2012-03-26 LAB — CBC WITH DIFFERENTIAL/PLATELET
Basophils Absolute: 0 10*3/uL (ref 0.0–0.1)
Basophils Relative: 0 % (ref 0–1)
MCHC: 33.1 g/dL (ref 30.0–36.0)
Monocytes Absolute: 0.6 10*3/uL (ref 0.1–1.0)
Neutro Abs: 4.1 10*3/uL (ref 1.7–7.7)
Neutrophils Relative %: 58 % (ref 43–77)
Platelets: 142 10*3/uL — ABNORMAL LOW (ref 150–400)
RDW: 13.5 % (ref 11.5–15.5)
WBC: 7.1 10*3/uL (ref 4.0–10.5)

## 2012-03-26 LAB — URINALYSIS, ROUTINE W REFLEX MICROSCOPIC
Leukocytes, UA: NEGATIVE
Nitrite: NEGATIVE
Protein, ur: NEGATIVE mg/dL
Specific Gravity, Urine: 1.015 (ref 1.005–1.030)
Urobilinogen, UA: 0.2 mg/dL (ref 0.0–1.0)

## 2012-03-26 LAB — GLUCOSE, CAPILLARY: Glucose-Capillary: 194 mg/dL — ABNORMAL HIGH (ref 70–99)

## 2012-03-26 LAB — COMPREHENSIVE METABOLIC PANEL
ALT: 19 U/L (ref 0–35)
AST: 16 U/L (ref 0–37)
Albumin: 3.1 g/dL — ABNORMAL LOW (ref 3.5–5.2)
Chloride: 99 mEq/L (ref 96–112)
Creatinine, Ser: 0.69 mg/dL (ref 0.50–1.10)
Potassium: 3.9 mEq/L (ref 3.5–5.1)
Sodium: 133 mEq/L — ABNORMAL LOW (ref 135–145)
Total Bilirubin: 0.2 mg/dL — ABNORMAL LOW (ref 0.3–1.2)

## 2012-03-26 MED ORDER — OXYCODONE-ACETAMINOPHEN 5-325 MG PO TABS: 1.0000 | ORAL_TABLET | ORAL | Status: AC | PRN

## 2012-03-26 NOTE — ED Notes (Signed)
Patient with c/o dysuria x 1 week. C/o lower back pain.

## 2012-03-26 NOTE — ED Provider Notes (Signed)
History   This chart was scribed for Lisa Booze, MD scribed by Magnus Sinning. The patient was seen in room APA18/APA18 at 18:48.   CSN: [id number]  Arrival date & time 03/26/12  1824    Chief Complaint  Patient presents with  . Urinary Frequency    (Consider location/radiation/quality/duration/timing/severity/associated sxs/prior treatment) HPI Comments: Lisa Todd is a 56 y.o. female who presents to the Emergency Department complaining of constant moderate increased urinary frequency, onset 3-4 days with associated vaginal itching, burning dysuria, urgency and lower back pain. She says she suspects it is a UTI as she was treated by Dr. Lodema Hong for it 2 months ago. She currently rates the pain a 10/10 and reports she has not tried any treatments. The patient denies fever, chills, and sweats. She notes hx of HTN and DM with a recent 60 pound weight gain.   Patient is a 56 y.o. female presenting with frequency and dysuria. The history is provided by the patient. No language interpreter was used.  Urinary Frequency This is a new problem. The current episode started more than 2 days ago. The problem occurs constantly. The problem has not changed since onset.Nothing relieves the symptoms. She has tried nothing for the symptoms.  Dysuria  This is a new problem. The current episode started more than 2 days ago. The problem occurs every urination. The problem has not changed since onset.The quality of the pain is described as burning. The pain is at a severity of 10/10. The pain is moderate. There has been no fever. Associated symptoms include frequency and urgency. Pertinent negatives include no chills and no sweats. She has tried nothing for the symptoms. Her past medical history is significant for recurrent UTIs.    Past Medical History  Diagnosis Date  . Chronic back pain   . Hyperlipidemia   . Asthma   . Sleep apnea   . Depression   . Obesity   . Hypertension   . IDDM (insulin  dependent diabetes mellitus)   . Diabetes mellitus   . Arthritis     Past Surgical History  Procedure Date  . Vesicovaginal fistula closure w/ tah 2002  . Knee left arthroscopy 2004  . Right knee surgery 2007  . Bladder repair w/ cesarean section 1989  . Right rotator cuff surgery 11/26/2009  . Abdominal hysterectomy     Family History  Problem Relation Age of Onset  . Breast cancer Mother   . Diabetes Mother   . Hypertension Mother   . Asthma Father   . Diabetes Father   . Hypertension Father   . Diabetes Brother   . Hypertension Brother   . Diabetes Sister   . Hypertension Sister     History  Substance Use Topics  . Smoking status: Never Smoker   . Smokeless tobacco: Not on file  . Alcohol Use: No   Review of Systems  Constitutional: Negative for chills.  Genitourinary: Positive for dysuria, urgency and frequency.  All other systems reviewed and are negative.   10 Systems reviewed and are negative for acute change except as noted in the HPI. Allergies  Liraglutide  Home Medications   Current Outpatient Rx  Name  Route  Sig  Dispense  Refill  . ALBUTEROL SULFATE HFA 108 (90 BASE) MCG/ACT IN AERS   Inhalation   Inhale 2 puffs into the lungs every 6 (six) hours as needed. Shortness of Breath         . ALBUTEROL SULFATE HFA 108 (  90 BASE) MCG/ACT IN AERS   Inhalation   Inhale 1-2 puffs into the lungs every 6 (six) hours as needed for wheezing.   1 Inhaler   0   . ALPRAZOLAM 1 MG PO TABS   Oral   Take 1 tablet (1 mg total) by mouth at bedtime as needed for sleep.   30 tablet   0     Dose reduction effective 12/17/2011   . ASPIRIN EC 81 MG PO TBEC   Oral   Take 81 mg by mouth daily.         . BECLOMETHASONE DIPROPIONATE 40 MCG/ACT IN AERS   Inhalation   Inhale 1 puff into the lungs 2 (two) times daily.          Marland Kitchen VITAMIN D3 50000 UNITS PO CAPS   Oral   Take 50,000 Units by mouth once a week. On Mondays         . CYCLOBENZAPRINE HCL 10 MG  PO TABS      One tablet at bedtime, as needed, for muscle spasm   30 tablet   0   . DICLOFENAC SODIUM 1 % TD GEL   Topical   Apply 1 application topically at bedtime. For arthritis pain         . DILTIAZEM HCL ER COATED BEADS 240 MG PO CP24   Oral   Take 240 mg by mouth daily.         Marland Kitchen ESOMEPRAZOLE MAGNESIUM 40 MG PO CPDR   Oral   Take 40 mg by mouth daily before breakfast.         . IBUPROFEN 800 MG PO TABS   Oral   Take 400 mg by mouth every 6 (six) hours as needed. Pain         . INSULIN ASPART 100 UNIT/ML [place] SOLN   Subcutaneous   Inject 9-20 Units into the skin 3 (three) times daily before meals. For sugar per sliding scale         . INSULIN GLARGINE 100 UNIT/ML [place] SOLN   Subcutaneous   Inject 35 Units into the skin at bedtime.         Marland Kitchen LORAZEPAM 1 MG PO TABS   Oral   Take 1 tablet (1 mg total) by mouth 3 (three) times daily as needed for anxiety.   10 tablet   0   . ENERGY BOOSTER PO   Oral   Take 1 tablet by mouth daily.         Marland Kitchen NAPROXEN 375 MG PO TABS   Oral   Take 1 tablet (375 mg total) by mouth 2 (two) times daily.   20 tablet   0   . OXYCODONE-ACETAMINOPHEN 10-325 MG PO TABS   Oral   Take 1 tablet by mouth every 6 (six) hours as needed. For pain         . PEG-KCL-NACL-NASULF-NA ASC-C 100 G PO SOLR   Oral   Take 1 kit (100 g total) by mouth once.   1 kit   0   . PREDNISONE 10 MG PO TABS   Oral   Take 2 tablets (20 mg total) by mouth daily.   10 tablet   0   . VENLAFAXINE HCL ER 75 MG PO CP24   Oral   Take 75 mg by mouth daily.           BP 133/70  Pulse 91  Temp 97.9 F (36.6 C) (Oral)  Resp  22  Ht 5\' 3"  (1.6 m)  Wt 313 lb (141.976 kg)  BMI 55.45 kg/m2  SpO2 98%  Physical Exam  Nursing note and vitals reviewed. Constitutional: She is oriented to person, place, and time. She appears well-developed and well-nourished. No distress.       Obese  HENT:  Head: Normocephalic and atraumatic.  Eyes:  Conjunctivae normal and EOM are normal.  Neck: Neck supple. No tracheal deviation present.  Cardiovascular: Normal rate.   Pulmonary/Chest: Effort normal. No respiratory distress.  Abdominal: Soft. Bowel sounds are normal. She exhibits no distension. There is no tenderness.  Musculoskeletal: Normal range of motion.  Neurological: She is alert and oriented to person, place, and time. No sensory deficit.  Skin: Skin is warm and dry.  Psychiatric: She has a normal mood and affect. Her behavior is normal.    ED Course  Procedures (including critical care time) DIAGNOSTIC STUDIES: Oxygen Saturation is 98% on room air, normal by my interpretation.    COORDINATION OF CARE: 18:48: Physical exam performed.  Results for orders placed during the hospital encounter of 03/26/12  URINALYSIS, ROUTINE W REFLEX MICROSCOPIC      Component Value Range   Color, Urine YELLOW  YELLOW   APPearance CLEAR  CLEAR   Specific Gravity, Urine 1.015  1.005 - 1.030   pH 7.5  5.0 - 8.0   Glucose, UA NEGATIVE  NEGATIVE mg/dL   Hgb urine dipstick NEGATIVE  NEGATIVE   Bilirubin Urine NEGATIVE  NEGATIVE   Ketones, ur NEGATIVE  NEGATIVE mg/dL   Protein, ur NEGATIVE  NEGATIVE mg/dL   Urobilinogen, UA 0.2  0.0 - 1.0 mg/dL   Nitrite NEGATIVE  NEGATIVE   Leukocytes, UA NEGATIVE  NEGATIVE  GLUCOSE, CAPILLARY      Component Value Range   Glucose-Capillary 194 (*) 70 - 99 mg/dL  CBC WITH DIFFERENTIAL      Component Value Range   WBC 7.1  4.0 - 10.5 K/uL   RBC 4.09  3.87 - 5.11 MIL/uL   Hemoglobin 11.8 (*) 12.0 - 15.0 g/dL   HCT 98.1 (*) 19.1 - 47.8 %   MCV 87.0  78.0 - 100.0 fL   MCH 28.9  26.0 - 34.0 pg   MCHC 33.1  30.0 - 36.0 g/dL   RDW 29.5  62.1 - 30.8 %   Platelets 142 (*) 150 - 400 K/uL   Neutrophils Relative 58  43 - 77 %   Neutro Abs 4.1  1.7 - 7.7 K/uL   Lymphocytes Relative 32  12 - 46 %   Lymphs Abs 2.3  0.7 - 4.0 K/uL   Monocytes Relative 8  3 - 12 %   Monocytes Absolute 0.6  0.1 - 1.0 K/uL    Eosinophils Relative 1  0 - 5 %   Eosinophils Absolute 0.1  0.0 - 0.7 K/uL   Basophils Relative 0  0 - 1 %   Basophils Absolute 0.0  0.0 - 0.1 K/uL  COMPREHENSIVE METABOLIC PANEL      Component Value Range   Sodium 133 (*) 135 - 145 mEq/L   Potassium 3.9  3.5 - 5.1 mEq/L   Chloride 99  96 - 112 mEq/L   CO2 25  19 - 32 mEq/L   Glucose, Bld 210 (*) 70 - 99 mg/dL   BUN 14  6 - 23 mg/dL   Creatinine, Ser 6.57  0.50 - 1.10 mg/dL   Calcium 8.9  8.4 - 84.6 mg/dL   Total Protein  6.6  6.0 - 8.3 g/dL   Albumin 3.1 (*) 3.5 - 5.2 g/dL   AST 16  0 - 37 U/L   ALT 19  0 - 35 U/L   Alkaline Phosphatase 59  39 - 117 U/L   Total Bilirubin 0.2 (*) 0.3 - 1.2 mg/dL   GFR calc non Af Amer >90  >90 mL/min   GFR calc Af Amer >90  >90 mL/min     1. Abdominal pain       MDM  Symptoms strongly suggestive of urinary tract infection. Urinalysis is been obtained.  Urinalysis shows no sign of infection and no blood. Laboratory workup will be initiated, but I anticipate that she will need outpatient evaluation.  Laboratory workup is unremarkable. She is sent home with prescription for Percocet and is to followup with her PCP.  I personally performed the services described in this documentation, which was scribed in my presence. The recorded information has been reviewed and is accurate.           Lisa Booze, MD 03/26/12 2046

## 2012-03-26 NOTE — ED Notes (Signed)
Pt discharged. Pt stable at time of discharge. Medications reviewed pt has no questions regarding discharge at this time. Pt voiced understanding of discharge instructions.  

## 2012-05-21 ENCOUNTER — Emergency Department (HOSPITAL_COMMUNITY)
Admission: EM | Admit: 2012-05-21 | Discharge: 2012-05-22 | Disposition: A | Discharge status: Home / Self Care | Payer: Medicaid Other | Attending: Emergency Medicine | Admitting: Emergency Medicine

## 2012-05-21 ENCOUNTER — Encounter (HOSPITAL_COMMUNITY): Payer: Self-pay | Admitting: *Deleted

## 2012-05-21 DIAGNOSIS — Z8739 Personal history of other diseases of the musculoskeletal system and connective tissue: Secondary | ICD-10-CM | POA: Insufficient documentation

## 2012-05-21 DIAGNOSIS — F3289 Other specified depressive episodes: Secondary | ICD-10-CM | POA: Insufficient documentation

## 2012-05-21 DIAGNOSIS — N898 Other specified noninflammatory disorders of vagina: Secondary | ICD-10-CM | POA: Insufficient documentation

## 2012-05-21 DIAGNOSIS — N390 Urinary tract infection, site not specified: Secondary | ICD-10-CM

## 2012-05-21 DIAGNOSIS — Z794 Long term (current) use of insulin: Secondary | ICD-10-CM | POA: Insufficient documentation

## 2012-05-21 DIAGNOSIS — Z79899 Other long term (current) drug therapy: Secondary | ICD-10-CM | POA: Insufficient documentation

## 2012-05-21 DIAGNOSIS — F329 Major depressive disorder, single episode, unspecified: Secondary | ICD-10-CM | POA: Insufficient documentation

## 2012-05-21 DIAGNOSIS — I10 Essential (primary) hypertension: Secondary | ICD-10-CM | POA: Insufficient documentation

## 2012-05-21 DIAGNOSIS — E669 Obesity, unspecified: Secondary | ICD-10-CM | POA: Insufficient documentation

## 2012-05-21 DIAGNOSIS — R300 Dysuria: Secondary | ICD-10-CM | POA: Insufficient documentation

## 2012-05-21 DIAGNOSIS — Z862 Personal history of diseases of the blood and blood-forming organs and certain disorders involving the immune mechanism: Secondary | ICD-10-CM | POA: Insufficient documentation

## 2012-05-21 DIAGNOSIS — R110 Nausea: Secondary | ICD-10-CM | POA: Insufficient documentation

## 2012-05-21 DIAGNOSIS — L293 Anogenital pruritus, unspecified: Secondary | ICD-10-CM | POA: Insufficient documentation

## 2012-05-21 DIAGNOSIS — Z7982 Long term (current) use of aspirin: Secondary | ICD-10-CM | POA: Insufficient documentation

## 2012-05-21 DIAGNOSIS — Z8639 Personal history of other endocrine, nutritional and metabolic disease: Secondary | ICD-10-CM | POA: Insufficient documentation

## 2012-05-21 DIAGNOSIS — J45909 Unspecified asthma, uncomplicated: Secondary | ICD-10-CM | POA: Insufficient documentation

## 2012-05-21 DIAGNOSIS — E119 Type 2 diabetes mellitus without complications: Secondary | ICD-10-CM | POA: Insufficient documentation

## 2012-05-21 LAB — URINE MICROSCOPIC-ADD ON

## 2012-05-21 LAB — URINALYSIS, ROUTINE W REFLEX MICROSCOPIC
Glucose, UA: >1000 mg/dL — AB
Hgb urine dipstick: NEGATIVE
Protein, ur: NEGATIVE mg/dL

## 2012-05-21 MED ORDER — ONDANSETRON 8 MG PO TBDP
8.0000 mg | ORAL_TABLET | Freq: Once | ORAL | Status: AC
  Administered 2012-05-21: 8 mg via ORAL
  Filled 2012-05-21: qty 1

## 2012-05-21 NOTE — ED Notes (Addendum)
Pt c/o lower back pain, dysuria, and vaginal itching. Pt also hasn't checked her blood sugar in several days.

## 2012-05-21 NOTE — ED Provider Notes (Signed)
History  This chart was scribed for [employer]. Colon Branch, MD by Erskine Emery, ED Scribe. This patient was seen in room APA10/APA10 and the patient's care was started at 23:15.   CSN: [id number]  Arrival date & time 05/21/12  2144   First MD Initiated Contact with Patient 05/21/12 2315      Chief Complaint  Patient presents with  . Back Pain  . Dysuria  . Vaginal Itching    (Consider location/radiation/quality/duration/timing/severity/associated sxs/prior treatment) The history is provided by the patient. No language interpreter was used.  Lisa Todd is a 57 y.o. female with a h/o DM and HTN who presents to the Emergency Department complaining of a burning dysuria for the past week. Pt reports some associated vaginal discharge, vaginal itching, back pain, and intermittent nausea but denies any emesis.   Pt reports she lost her granddaughter over the holidays so she missed some appointments with her PCP and now she is now out of her medications. Pt has been seeing Dr. Lodema Hong but wants to see Dr. Felecia Shelling now.  Past Medical History  Diagnosis Date  . Chronic back pain   . Hyperlipidemia   . Asthma   . Sleep apnea   . Depression   . Obesity   . Hypertension   . IDDM (insulin dependent diabetes mellitus)   . Diabetes mellitus   . Arthritis     Past Surgical History  Procedure Date  . Vesicovaginal fistula closure w/ tah 2002  . Knee left arthroscopy 2004  . Right knee surgery 2007  . Bladder repair w/ cesarean section 1989  . Right rotator cuff surgery 11/26/2009  . Abdominal hysterectomy     Family History  Problem Relation Age of Onset  . Breast cancer Mother   . Diabetes Mother   . Hypertension Mother   . Asthma Father   . Diabetes Father   . Hypertension Father   . Diabetes Brother   . Hypertension Brother   . Diabetes Sister   . Hypertension Sister     History  Substance Use Topics  . Smoking status: Never Smoker   . Smokeless tobacco: Not on file  .  Alcohol Use: No    OB History    Grav Para Term Preterm Abortions TAB SAB Ect Mult Living                  Review of Systems A complete 10 system review of systems was obtained and all systems are negative except as noted in the HPI and PMH.    Allergies  Liraglutide  Home Medications   Current Outpatient Rx  Name  Route  Sig  Dispense  Refill  . ALBUTEROL SULFATE HFA 108 (90 BASE) MCG/ACT IN AERS   Inhalation   Inhale 2 puffs into the lungs every 6 (six) hours as needed. Shortness of Breath         . ALPRAZOLAM 1 MG PO TABS   Oral   Take 1 tablet (1 mg total) by mouth at bedtime as needed for sleep.   30 tablet   0     Dose reduction effective 12/17/2011   . ASPIRIN EC 81 MG PO TBEC   Oral   Take 81 mg by mouth daily.         Marland Kitchen VITAMIN D3 50000 UNITS PO CAPS   Oral   Take 50,000 Units by mouth once a week. On Mondays         . CYCLOBENZAPRINE HCL  10 MG PO TABS      One tablet at bedtime, as needed, for muscle spasm   30 tablet   0   . DICLOFENAC SODIUM 1 % TD GEL   Topical   Apply 1 application topically at bedtime. For arthritis pain         . DILTIAZEM HCL ER COATED BEADS 240 MG PO CP24   Oral   Take 240 mg by mouth daily.         Marland Kitchen ESOMEPRAZOLE MAGNESIUM 40 MG PO CPDR   Oral   Take 40 mg by mouth daily before breakfast.         . IBUPROFEN 800 MG PO TABS   Oral   Take 400 mg by mouth every 6 (six) hours as needed. Pain         . INSULIN ASPART 100 UNIT/ML [place] SOLN   Subcutaneous   Inject 9-20 Units into the skin 3 (three) times daily before meals. For sugar per sliding scale         . LORAZEPAM 1 MG PO TABS   Oral   Take 1 tablet (1 mg total) by mouth 3 (three) times daily as needed for anxiety.   10 tablet   0   . INSULIN GLARGINE 100 UNIT/ML [place] SOLN   Subcutaneous   Inject 35 Units into the skin at bedtime.           Triage Vitals: BP 151/76  Pulse 98  Temp 98.3 F (36.8 C)  Resp 20  Ht 5\' 3"  (1.6 m)  Wt 306 lb  4 oz (138.914 kg)  BMI 54.25 kg/m2  SpO2 100%  Physical Exam  Nursing note and vitals reviewed. Constitutional: She is oriented to person, place, and time. She appears well-developed and well-nourished. No distress.  HENT:  Head: Normocephalic and atraumatic.  Eyes: EOM are normal. Pupils are equal, round, and reactive to light.  Neck: Neck supple. No tracheal deviation present.  Cardiovascular: Normal rate.   Pulmonary/Chest: Effort normal. No respiratory distress.  Abdominal: Soft. She exhibits no distension.  Musculoskeletal: Normal range of motion. She exhibits no edema.  Neurological: She is alert and oriented to person, place, and time.  Skin: Skin is warm and dry.  Psychiatric: She has a normal mood and affect.    ED Course  Procedures (including critical care time) Results for orders placed during the hospital encounter of 05/21/12  URINALYSIS, ROUTINE W REFLEX MICROSCOPIC      Component Value Range   Color, Urine YELLOW  YELLOW   APPearance CLEAR  CLEAR   Specific Gravity, Urine 1.020  1.005 - 1.030   pH 6.5  5.0 - 8.0   Glucose, UA >1000 (*) NEGATIVE mg/dL   Hgb urine dipstick NEGATIVE  NEGATIVE   Bilirubin Urine NEGATIVE  NEGATIVE   Ketones, ur NEGATIVE  NEGATIVE mg/dL   Protein, ur NEGATIVE  NEGATIVE mg/dL   Urobilinogen, UA 0.2  0.0 - 1.0 mg/dL   Nitrite NEGATIVE  NEGATIVE   Leukocytes, UA NEGATIVE  NEGATIVE  URINE MICROSCOPIC-ADD ON      Component Value Range   Squamous Epithelial / LPF FEW (*) RARE   WBC, UA 3-6  <3 WBC/hpf   Bacteria, UA FEW (*) RARE  GLUCOSE, CAPILLARY      Component Value Range   Glucose-Capillary 297 (*) 70 - 99 mg/dL   Comment 1 Documented in Chart     DIAGNOSTIC STUDIES: Oxygen Saturation is 100% on room air, normal  by my interpretation.    COORDINATION OF CARE: 23:26--I evaluated the patient and we discussed a treatment plan including recheck and urinalysis to which the pt agreed.        MDM  Patient with possible early  UTI going to the bathroom frequently, burning, back pain. UA with evidence of beginning UTI. Macrodantin given.Pt stable in ED with no significant deterioration in condition.The patient appears reasonably screened and/or stabilized for discharge and I doubt any other medical condition or other [facility] requiring further screening, evaluation, or treatment in the ED at this time prior to discharge.    I personally performed the services described in this documentation, which was scribed in my presence. The recorded information has been reviewed and considered.  MDM Reviewed: nursing note and vitals Interpretation: labs            Nicoletta Dress. Colon Branch, MD 05/22/12 [id number]

## 2012-05-21 NOTE — ED Notes (Signed)
Went out to the waiting room to let pt know that I would need a urine specimen. Pt ranting about how pt's were going back before her. There was 1 pt who went for a chest xray and I advised her that pt was not going in front of her. Pt then understood after some talking to.

## 2012-05-21 NOTE — ED Notes (Signed)
NT reports that pt refuses to change into a gown

## 2012-05-21 NOTE — ED Notes (Signed)
Pt questioning me if pts were gonna go back before her. An elderly lady with sob was checking in at the time and pt said "Now you know good and damn well ain't nothing wrong with her. All these people faking so they get seen quicker." Pt advised of wait time and people being placed due to acuity. Pt still saying people were gonna fake an illness to get seen before her.

## 2012-05-21 NOTE — ED Notes (Signed)
Pt placed in room 10. Pt c/o that when she is put in that room, she is forgotten about and she has to remind the nurses that she is here. I advised her that her nurse would be right in and that I would be her nurse at 11pm.

## 2012-05-21 NOTE — ED Notes (Signed)
Pt with discomfort with urination x 2 days with vaginal discharge and vaginal itching, unable to see PCP due to granddaughter's sickness and death

## 2012-05-22 MED ORDER — INSULIN GLARGINE 100 UNIT/ML ~~LOC~~ SOLN: 70.0000 [IU] | Freq: Every day | SUBCUTANEOUS | Status: DC

## 2012-05-22 MED ORDER — NITROFURANTOIN MACROCRYSTAL 100 MG PO CAPS
100.0000 mg | ORAL_CAPSULE | Freq: Once | ORAL | Status: AC
  Administered 2012-05-22: 100 mg via ORAL
  Filled 2012-05-22: qty 1

## 2012-05-22 MED ORDER — NITROFURANTOIN MACROCRYSTAL 100 MG PO CAPS: 100.0000 mg | ORAL_CAPSULE | Freq: Four times a day (QID) | ORAL | Status: DC

## 2012-05-23 LAB — URINE CULTURE

## 2012-06-07 ENCOUNTER — Other Ambulatory Visit: Payer: Self-pay | Admitting: Family Medicine

## 2012-08-05 ENCOUNTER — Emergency Department (HOSPITAL_COMMUNITY): Payer: Medicaid Other

## 2012-08-05 ENCOUNTER — Encounter (HOSPITAL_COMMUNITY): Payer: Self-pay | Admitting: *Deleted

## 2012-08-05 ENCOUNTER — Emergency Department (HOSPITAL_COMMUNITY)
Admission: EM | Admit: 2012-08-05 | Discharge: 2012-08-05 | Disposition: A | Discharge status: Home / Self Care | Payer: Medicaid Other | Attending: Emergency Medicine | Admitting: Emergency Medicine

## 2012-08-05 DIAGNOSIS — F3289 Other specified depressive episodes: Secondary | ICD-10-CM | POA: Insufficient documentation

## 2012-08-05 DIAGNOSIS — G8929 Other chronic pain: Secondary | ICD-10-CM | POA: Insufficient documentation

## 2012-08-05 DIAGNOSIS — Z8669 Personal history of other diseases of the nervous system and sense organs: Secondary | ICD-10-CM | POA: Insufficient documentation

## 2012-08-05 DIAGNOSIS — E669 Obesity, unspecified: Secondary | ICD-10-CM | POA: Insufficient documentation

## 2012-08-05 DIAGNOSIS — Z79899 Other long term (current) drug therapy: Secondary | ICD-10-CM | POA: Insufficient documentation

## 2012-08-05 DIAGNOSIS — E785 Hyperlipidemia, unspecified: Secondary | ICD-10-CM | POA: Insufficient documentation

## 2012-08-05 DIAGNOSIS — M129 Arthropathy, unspecified: Secondary | ICD-10-CM | POA: Insufficient documentation

## 2012-08-05 DIAGNOSIS — H9209 Otalgia, unspecified ear: Secondary | ICD-10-CM | POA: Insufficient documentation

## 2012-08-05 DIAGNOSIS — R059 Cough, unspecified: Secondary | ICD-10-CM | POA: Insufficient documentation

## 2012-08-05 DIAGNOSIS — R51 Headache: Secondary | ICD-10-CM | POA: Insufficient documentation

## 2012-08-05 DIAGNOSIS — J45909 Unspecified asthma, uncomplicated: Secondary | ICD-10-CM | POA: Insufficient documentation

## 2012-08-05 DIAGNOSIS — R350 Frequency of micturition: Secondary | ICD-10-CM | POA: Insufficient documentation

## 2012-08-05 DIAGNOSIS — F329 Major depressive disorder, single episode, unspecified: Secondary | ICD-10-CM | POA: Insufficient documentation

## 2012-08-05 DIAGNOSIS — R05 Cough: Secondary | ICD-10-CM

## 2012-08-05 DIAGNOSIS — E119 Type 2 diabetes mellitus without complications: Secondary | ICD-10-CM | POA: Insufficient documentation

## 2012-08-05 DIAGNOSIS — I10 Essential (primary) hypertension: Secondary | ICD-10-CM | POA: Insufficient documentation

## 2012-08-05 DIAGNOSIS — Z794 Long term (current) use of insulin: Secondary | ICD-10-CM | POA: Insufficient documentation

## 2012-08-05 LAB — URINALYSIS, ROUTINE W REFLEX MICROSCOPIC
Bilirubin Urine: NEGATIVE
Ketones, ur: NEGATIVE mg/dL
Leukocytes, UA: NEGATIVE
Nitrite: NEGATIVE
Specific Gravity, Urine: <1.005 — ABNORMAL LOW (ref 1.005–1.030)
Urobilinogen, UA: 0.2 mg/dL (ref 0.0–1.0)

## 2012-08-05 MED ORDER — PREDNISONE 50 MG PO TABS
60.0000 mg | ORAL_TABLET | Freq: Once | ORAL | Status: AC
  Administered 2012-08-05: 60 mg via ORAL
  Filled 2012-08-05: qty 1

## 2012-08-05 MED ORDER — ALBUTEROL SULFATE (5 MG/ML) 0.5% IN NEBU
5.0000 mg | INHALATION_SOLUTION | Freq: Once | RESPIRATORY_TRACT | Status: AC
  Administered 2012-08-05: 5 mg via RESPIRATORY_TRACT
  Filled 2012-08-05: qty 1

## 2012-08-05 MED ORDER — ACETAMINOPHEN 500 MG PO TABS
1000.0000 mg | ORAL_TABLET | Freq: Once | ORAL | Status: AC
  Administered 2012-08-05: 1000 mg via ORAL
  Filled 2012-08-05: qty 2

## 2012-08-05 MED ORDER — ALBUTEROL SULFATE HFA 108 (90 BASE) MCG/ACT IN AERS: 1.0000 | INHALATION_SPRAY | Freq: Four times a day (QID) | RESPIRATORY_TRACT | Status: AC | PRN

## 2012-08-05 MED ORDER — PREDNISONE 10 MG PO TABS: 20.0000 mg | ORAL_TABLET | Freq: Every day | ORAL | Status: DC

## 2012-08-05 MED ORDER — IPRATROPIUM BROMIDE 0.02 % IN SOLN
0.5000 mg | Freq: Once | RESPIRATORY_TRACT | Status: AC
  Administered 2012-08-05: 0.5 mg via RESPIRATORY_TRACT
  Filled 2012-08-05: qty 2.5

## 2012-08-05 NOTE — ED Notes (Addendum)
Pt reporting occasional SOB, cough, runny nose, congestion and general aches for 2-3 days.  Reports cough occasionally productive.  No distress noted at this time. Reports she has been out of her inhaler for 2 days.

## 2012-08-05 NOTE — ED Provider Notes (Signed)
History     CSN: [id number]  Arrival date & time 08/05/12  0317   First MD Initiated Contact with Patient 08/05/12 [phone]      Chief Complaint  Patient presents with  . URI    (Consider location/radiation/quality/duration/timing/severity/associated sxs/prior treatment) HPI Lisa Todd is a 57 y.o. female who presents to the Emergency Department complaining of cough x several days, headache, urinary frequency x several days. Denies fever, chills, shortness of breath. She has been using her inhaler and ran out today. She has been taking robitussin OTC with no relief. She has recently changed doctors and will not see her new PCP until the 28th.   PCP  Dr. Sudie Bailey Past Medical History  Diagnosis Date  . Chronic back pain   . Hyperlipidemia   . Asthma   . Sleep apnea   . Depression   . Obesity   . Hypertension   . IDDM (insulin dependent diabetes mellitus)   . Diabetes mellitus   . Arthritis     Past Surgical History  Procedure Laterality Date  . Vesicovaginal fistula closure w/ tah  2002  . Knee left arthroscopy  2004  . Right knee surgery  2007  . Bladder repair w/ cesarean section  1989  . Right rotator cuff surgery  11/26/2009  . Abdominal hysterectomy      Family History  Problem Relation Age of Onset  . Breast cancer Mother   . Diabetes Mother   . Hypertension Mother   . Asthma Father   . Diabetes Father   . Hypertension Father   . Diabetes Brother   . Hypertension Brother   . Diabetes Sister   . Hypertension Sister     History  Substance Use Topics  . Smoking status: Never Smoker   . Smokeless tobacco: Not on file  . Alcohol Use: No    OB History   Grav Para Term Preterm Abortions TAB SAB Ect Mult Living                  Review of Systems  Constitutional: Negative for fever.       10 Systems reviewed and are negative for acute change except as noted in the HPI.  HENT: Positive for ear pain. Negative for congestion.   Eyes: Negative for  discharge and redness.  Respiratory: Positive for cough. Negative for shortness of breath.   Cardiovascular: Negative for chest pain.  Gastrointestinal: Negative for vomiting and abdominal pain.  Genitourinary: Positive for frequency.  Musculoskeletal: Negative for back pain.  Skin: Negative for rash.  Neurological: Positive for headaches. Negative for syncope and numbness.  Psychiatric/Behavioral:       No behavior change.    Allergies  Liraglutide  Home Medications   Current Outpatient Rx  Name  Route  Sig  Dispense  Refill  . ALPRAZolam (XANAX) 1 MG tablet   Oral   Take 1 tablet (1 mg total) by mouth at bedtime as needed for sleep.   30 tablet   0     Dose reduction effective 12/17/2011   . aspirin EC 81 MG tablet   Oral   Take 81 mg by mouth daily.         . Cholecalciferol (VITAMIN D3) 50000 UNITS CAPS   Oral   Take 50,000 Units by mouth once a week. On Mondays         . cyclobenzaprine (FLEXERIL) 10 MG tablet      One tablet at bedtime, as needed,  for muscle spasm   30 tablet   0   . diclofenac sodium (VOLTAREN) 1 % GEL   Topical   Apply 1 application topically at bedtime. For arthritis pain         . diltiazem (CARDIZEM CD) 240 MG 24 hr capsule   Oral   Take 240 mg by mouth daily.         Marland Kitchen esomeprazole (NEXIUM) 40 MG capsule   Oral   Take 40 mg by mouth daily before breakfast.         . ibuprofen (MOTRIN) 800 MG tablet   Oral   Take 400 mg by mouth every 6 (six) hours as needed. Pain         . insulin aspart (NOVOLOG FLEXPEN) 100 UNIT/ML injection   Subcutaneous   Inject 9-20 Units into the skin 3 (three) times daily before meals. For sugar per sliding scale         . EXPIRED: insulin glargine (LANTUS) 100 UNIT/ML injection   Subcutaneous   Inject 35 Units into the skin at bedtime.         . insulin glargine (LANTUS) 100 UNIT/ML injection   Subcutaneous   Inject 70-99 Units into the skin at bedtime.   10 mL   12   .  LORazepam (ATIVAN) 1 MG tablet   Oral   Take 1 tablet (1 mg total) by mouth 3 (three) times daily as needed for anxiety.   10 tablet   0   . nitrofurantoin (MACRODANTIN) 100 MG capsule   Oral   Take 1 capsule (100 mg total) by mouth 4 (four) times daily.   14 capsule   0   . PROAIR HFA 108 (90 BASE) MCG/ACT inhaler      USE 2 PUFFS EVERY 6 TO 8 HOURS AS NEEDED FOR WHEEZING.   8.5 g   3     BP 154/77  Pulse 86  Temp(Src) 99.5 F (37.5 C) (Oral)  Resp 24  Ht 5\' 3"  (1.6 m)  Wt 308 lb 12.8 oz (140.071 kg)  BMI 54.72 kg/m2  SpO2 100%  Physical Exam  Nursing note and vitals reviewed. Constitutional: She appears well-developed and well-nourished.  Awake, alert, nontoxic appearance.  HENT:  Head: Normocephalic and atraumatic.  Left Ear: External ear normal.  Mouth/Throat: Oropharynx is clear and moist.  Eyes: EOM are normal. Pupils are equal, round, and reactive to light.  Neck: Neck supple.  Cardiovascular: Normal rate and intact distal pulses.   Pulmonary/Chest: Effort normal. No respiratory distress. She has no wheezes. She has no rales. She exhibits no tenderness.  coughing  Abdominal: Soft. There is no tenderness. There is no rebound.  Genitourinary:  No cva tenderness  Musculoskeletal: She exhibits no tenderness.  Baseline ROM, no obvious new focal weakness.  Neurological:  Mental status and motor strength appears baseline for patient and situation.  Skin: No rash noted.  Psychiatric: She has a normal mood and affect.    ED Course  Procedures (including critical care time) Results for orders placed during the hospital encounter of 05/21/12  URINE CULTURE      Result Value Range   Specimen Description URINE, CLEAN CATCH     Special Requests NONE     Culture  Setup Time 05/22/2012 20:42     Colony Count 40,000 COLONIES/ML     Culture       Value: Multiple bacterial morphotypes present, none predominant. Suggest appropriate recollection if clinically  indicated.  Report Status 05/23/2012 FINAL    URINALYSIS, ROUTINE W REFLEX MICROSCOPIC      Result Value Range   Color, Urine YELLOW  YELLOW   APPearance CLEAR  CLEAR   Specific Gravity, Urine 1.020  1.005 - 1.030   pH 6.5  5.0 - 8.0   Glucose, UA >1000 (*) NEGATIVE mg/dL   Hgb urine dipstick NEGATIVE  NEGATIVE   Bilirubin Urine NEGATIVE  NEGATIVE   Ketones, ur NEGATIVE  NEGATIVE mg/dL   Protein, ur NEGATIVE  NEGATIVE mg/dL   Urobilinogen, UA 0.2  0.0 - 1.0 mg/dL   Nitrite NEGATIVE  NEGATIVE   Leukocytes, UA NEGATIVE  NEGATIVE  URINE MICROSCOPIC-ADD ON      Result Value Range   Squamous Epithelial / LPF FEW (*) RARE   WBC, UA 3-6  <3 WBC/hpf   Bacteria, UA FEW (*) RARE  GLUCOSE, CAPILLARY      Result Value Range   Glucose-Capillary 297 (*) 70 - 99 mg/dL   Comment 1 Documented in Chart     Dg Chest 2 View  08/05/2012  *RADIOLOGY REPORT*  Clinical Data: Shortness of breath, cough and congestion.  CHEST - 2 VIEW  Comparison: Chest radiograph performed 05/25/2012  Findings: The lungs are well-aerated and clear.  There is no evidence of focal opacification, pleural effusion or pneumothorax.  The heart is normal in size; the mediastinal contour is within normal limits.  No acute osseous abnormalities are seen.  IMPRESSION: No acute cardiopulmonary process seen.   Original Report Authenticated By: Tonia Ghent, M.D.      [phone] Patient's cough is better after nebulizer treatment. Medications  albuterol (PROVENTIL) (5 MG/ML) 0.5% nebulizer solution 5 mg (5 mg Nebulization Given 08/05/12 0351)  ipratropium (ATROVENT) nebulizer solution 0.5 mg (0.5 mg Nebulization Given 08/05/12 0351)  acetaminophen (TYLENOL) tablet 1,000 mg (1,000 mg Oral Given 08/05/12 0342)  predniSONE (DELTASONE) tablet 60 mg (60 mg Oral Given 08/05/12 0440)    MDM  Patient with cough, headache and urinary frequency. Chest xray negative. Patient with improved cough after nebulizer treatment. Began steroid therapy. Pt  stable in ED with no significant deterioration in condition.The patient appears reasonably screened and/or stabilized for discharge and I doubt any other medical condition or other [facility] requiring further screening, evaluation, or treatment in the ED at this time prior to discharge.  MDM Reviewed: nursing note and vitals Interpretation: labs and x-ray           Nicoletta Dress. Colon Branch, MD 08/05/12 [phone]

## 2012-08-05 NOTE — ED Notes (Signed)
Pt sleeping at present time.  Respirations regular, even and unlabored.   

## 2012-08-07 ENCOUNTER — Emergency Department (HOSPITAL_COMMUNITY)
Admission: EM | Admit: 2012-08-07 | Discharge: 2012-08-07 | Disposition: A | Discharge status: Home / Self Care | Payer: Medicaid Other | Attending: Emergency Medicine | Admitting: Emergency Medicine

## 2012-08-07 ENCOUNTER — Encounter (HOSPITAL_COMMUNITY): Payer: Self-pay | Admitting: Emergency Medicine

## 2012-08-07 DIAGNOSIS — E669 Obesity, unspecified: Secondary | ICD-10-CM | POA: Insufficient documentation

## 2012-08-07 DIAGNOSIS — B349 Viral infection, unspecified: Secondary | ICD-10-CM

## 2012-08-07 DIAGNOSIS — B9789 Other viral agents as the cause of diseases classified elsewhere: Secondary | ICD-10-CM | POA: Insufficient documentation

## 2012-08-07 DIAGNOSIS — G4730 Sleep apnea, unspecified: Secondary | ICD-10-CM | POA: Insufficient documentation

## 2012-08-07 DIAGNOSIS — Z79899 Other long term (current) drug therapy: Secondary | ICD-10-CM | POA: Insufficient documentation

## 2012-08-07 DIAGNOSIS — IMO0002 Reserved for concepts with insufficient information to code with codable children: Secondary | ICD-10-CM | POA: Insufficient documentation

## 2012-08-07 DIAGNOSIS — Z7982 Long term (current) use of aspirin: Secondary | ICD-10-CM | POA: Insufficient documentation

## 2012-08-07 DIAGNOSIS — R079 Chest pain, unspecified: Secondary | ICD-10-CM | POA: Insufficient documentation

## 2012-08-07 DIAGNOSIS — I10 Essential (primary) hypertension: Secondary | ICD-10-CM | POA: Insufficient documentation

## 2012-08-07 DIAGNOSIS — M549 Dorsalgia, unspecified: Secondary | ICD-10-CM | POA: Insufficient documentation

## 2012-08-07 DIAGNOSIS — E119 Type 2 diabetes mellitus without complications: Secondary | ICD-10-CM | POA: Insufficient documentation

## 2012-08-07 DIAGNOSIS — G8929 Other chronic pain: Secondary | ICD-10-CM | POA: Insufficient documentation

## 2012-08-07 DIAGNOSIS — Z8739 Personal history of other diseases of the musculoskeletal system and connective tissue: Secondary | ICD-10-CM | POA: Insufficient documentation

## 2012-08-07 DIAGNOSIS — R059 Cough, unspecified: Secondary | ICD-10-CM | POA: Insufficient documentation

## 2012-08-07 DIAGNOSIS — Z794 Long term (current) use of insulin: Secondary | ICD-10-CM | POA: Insufficient documentation

## 2012-08-07 DIAGNOSIS — R05 Cough: Secondary | ICD-10-CM

## 2012-08-07 DIAGNOSIS — J45909 Unspecified asthma, uncomplicated: Secondary | ICD-10-CM | POA: Insufficient documentation

## 2012-08-07 MED ORDER — ALBUTEROL SULFATE 1.25 MG/3ML IN NEBU: 1.0000 | INHALATION_SOLUTION | Freq: Four times a day (QID) | RESPIRATORY_TRACT | Status: DC | PRN

## 2012-08-07 MED ORDER — GUAIFENESIN-CODEINE 100-10 MG/5ML PO SOLN
5.0000 mL | Freq: Once | ORAL | Status: AC
  Administered 2012-08-07: 5 mL via ORAL
  Filled 2012-08-07: qty 5

## 2012-08-07 MED ORDER — DEXTROMETHORPHAN HBR 15 MG/5ML PO SYRP: 10.0000 mL | ORAL_SOLUTION | Freq: Four times a day (QID) | ORAL | Status: DC | PRN

## 2012-08-07 NOTE — ED Notes (Signed)
Patient also reports emesis, headache, and same symptoms 2 nights ago.

## 2012-08-07 NOTE — ED Notes (Signed)
Pt alert & oriented x4, stable gait. Patient given discharge instructions, paperwork & prescription(s). Patient  instructed to stop at the registration desk to finish any additional paperwork. Patient verbalized understanding. Pt left department w/ no further questions. 

## 2012-08-07 NOTE — ED Notes (Signed)
Patient complaining of left-sided chest pain that started last night. Reports has been coughing x 4 days.

## 2012-08-07 NOTE — ED Provider Notes (Signed)
History     CSN: [id number]  Arrival date & time 08/07/12  0402   First MD Initiated Contact with Patient 08/07/12 0440      Chief Complaint  Patient presents with  . Chest Pain  . Cough    (Consider location/radiation/quality/duration/timing/severity/associated sxs/prior treatment) HPI Lisa Todd is a 57 y.o. female with a h/o DM, HTN, asthma, sleep apnea  who presents to the Emergency Department complaining of cough x 4 days associated with left sided chest pain.   Was seen here on 08/05/12 with a clear chest xray. She was begun on prednisone, using Robitussin PTC. Cough has been continuous since. Denies fever, chills, shortness of breath. Cough has caused sore throat and vomiting.   PCP Dr. Sudie Bailey Past Medical History  Diagnosis Date  . Chronic back pain   . Hyperlipidemia   . Asthma   . Sleep apnea   . Depression   . Obesity   . Hypertension   . IDDM (insulin dependent diabetes mellitus)   . Diabetes mellitus   . Arthritis     Past Surgical History  Procedure Laterality Date  . Vesicovaginal fistula closure w/ tah  2002  . Knee left arthroscopy  2004  . Right knee surgery  2007  . Bladder repair w/ cesarean section  1989  . Right rotator cuff surgery  11/26/2009  . Abdominal hysterectomy      Family History  Problem Relation Age of Onset  . Breast cancer Mother   . Diabetes Mother   . Hypertension Mother   . Asthma Father   . Diabetes Father   . Hypertension Father   . Diabetes Brother   . Hypertension Brother   . Diabetes Sister   . Hypertension Sister     History  Substance Use Topics  . Smoking status: Never Smoker   . Smokeless tobacco: Not on file  . Alcohol Use: No    OB History   Grav Para Term Preterm Abortions TAB SAB Ect Mult Living                  Review of Systems  Constitutional: Negative for fever.       10 Systems reviewed and are negative for acute change except as noted in the HPI.  HENT: Negative for congestion.    Eyes: Negative for discharge and redness.  Respiratory: Positive for cough. Negative for shortness of breath.        Chest discomfort.  Cardiovascular: Negative for chest pain.  Gastrointestinal: Negative for vomiting and abdominal pain.  Musculoskeletal: Negative for back pain.  Skin: Negative for rash.  Neurological: Negative for syncope, numbness and headaches.  Psychiatric/Behavioral:       No behavior change.    Allergies  Liraglutide  Home Medications   Current Outpatient Rx  Name  Route  Sig  Dispense  Refill  . albuterol (PROVENTIL HFA;VENTOLIN HFA) 108 (90 BASE) MCG/ACT inhaler   Inhalation   Inhale 1-2 puffs into the lungs every 6 (six) hours as needed for wheezing.   1 Inhaler   0   . ALPRAZolam (XANAX) 1 MG tablet   Oral   Take 1 tablet (1 mg total) by mouth at bedtime as needed for sleep.   30 tablet   0     Dose reduction effective 12/17/2011   . aspirin EC 81 MG tablet   Oral   Take 81 mg by mouth daily.         . Cholecalciferol (  VITAMIN D3) 50000 UNITS CAPS   Oral   Take 50,000 Units by mouth once a week. On Mondays         . cyclobenzaprine (FLEXERIL) 10 MG tablet      One tablet at bedtime, as needed, for muscle spasm   30 tablet   0   . diclofenac sodium (VOLTAREN) 1 % GEL   Topical   Apply 1 application topically at bedtime. For arthritis pain         . diltiazem (CARDIZEM CD) 240 MG 24 hr capsule   Oral   Take 240 mg by mouth daily.         Marland Kitchen esomeprazole (NEXIUM) 40 MG capsule   Oral   Take 40 mg by mouth daily before breakfast.         . ibuprofen (MOTRIN) 800 MG tablet   Oral   Take 400 mg by mouth every 6 (six) hours as needed. Pain         . insulin aspart (NOVOLOG FLEXPEN) 100 UNIT/ML injection   Subcutaneous   Inject 9-20 Units into the skin 3 (three) times daily before meals. For sugar per sliding scale         . EXPIRED: insulin glargine (LANTUS) 100 UNIT/ML injection   Subcutaneous   Inject 35 Units  into the skin at bedtime.         . insulin glargine (LANTUS) 100 UNIT/ML injection   Subcutaneous   Inject 70-99 Units into the skin at bedtime.   10 mL   12   . LORazepam (ATIVAN) 1 MG tablet   Oral   Take 1 tablet (1 mg total) by mouth 3 (three) times daily as needed for anxiety.   10 tablet   0   . nitrofurantoin (MACRODANTIN) 100 MG capsule   Oral   Take 1 capsule (100 mg total) by mouth 4 (four) times daily.   14 capsule   0   . predniSONE (DELTASONE) 10 MG tablet   Oral   Take 2 tablets (20 mg total) by mouth daily.   10 tablet   0   . PROAIR HFA 108 (90 BASE) MCG/ACT inhaler      USE 2 PUFFS EVERY 6 TO 8 HOURS AS NEEDED FOR WHEEZING.   8.5 g   3     BP 151/65  Pulse 88  Temp(Src) 97.5 F (36.4 C) (Oral)  Resp 24  Ht 5\' 3"  (1.6 m)  Wt 308 lb (139.708 kg)  BMI 54.57 kg/m2  SpO2 100%  Physical Exam  Nursing note and vitals reviewed. Constitutional: She appears well-developed and well-nourished.  Awake, alert, nontoxic appearance.  HENT:  Head: Normocephalic and atraumatic.  Right Ear: External ear normal.  Left Ear: External ear normal.  Mouth/Throat: Oropharynx is clear and moist.  Eyes: EOM are normal. Pupils are equal, round, and reactive to light.  Neck: Neck supple.  Cardiovascular: Normal rate.   Pulmonary/Chest: Effort normal and breath sounds normal. She has no wheezes. She has no rales. She exhibits no tenderness.  Abdominal: Soft. Bowel sounds are normal. There is no tenderness. There is no rebound.  Musculoskeletal: She exhibits no tenderness.  Baseline ROM, no obvious new focal weakness.  Neurological:  Mental status and motor strength appears baseline for patient and situation.  Skin: No rash noted.  Psychiatric: She has a normal mood and affect.    ED Course  Procedures (including critical care time)   Date: 08/07/2012  0412  Rate:  90  Rhythm: normal sinus rhythm  QRS Axis: normal  Intervals: normal  ST/T Wave  abnormalities: normal  Conduction Disutrbances: none  Narrative Interpretation: unremarkable   Medications  guaiFENesin-codeine 100-10 MG/5ML solution 5 mL (not administered)     MDM  Patient with persistent cough associated with viral illness. She is currently on albuterol nebs and inhaler, prednisone. Will write Rx for robitussin ac. Given robitussin ac while here. Pt stable in ED with no significant deterioration in condition.The patient appears reasonably screened and/or stabilized for discharge and I doubt any other medical condition or other [facility] requiring further screening, evaluation, or treatment in the ED at this time prior to discharge.  MDM Reviewed: nursing note and vitals Interpretation: ECG           Nicoletta Dress. Colon Branch, MD 08/07/12 1610

## 2012-08-07 NOTE — ED Notes (Signed)
Pt reports still coughing chest & head hurting. Nothing working.

## 2012-12-27 ENCOUNTER — Emergency Department (HOSPITAL_COMMUNITY)
Admission: EM | Admit: 2012-12-27 | Discharge: 2012-12-28 | Disposition: A | Discharge status: Home / Self Care | Payer: Medicaid Other | Attending: Emergency Medicine | Admitting: Emergency Medicine

## 2012-12-27 ENCOUNTER — Encounter (HOSPITAL_COMMUNITY): Payer: Self-pay

## 2012-12-27 DIAGNOSIS — Z8639 Personal history of other endocrine, nutritional and metabolic disease: Secondary | ICD-10-CM | POA: Insufficient documentation

## 2012-12-27 DIAGNOSIS — F329 Major depressive disorder, single episode, unspecified: Secondary | ICD-10-CM | POA: Insufficient documentation

## 2012-12-27 DIAGNOSIS — I10 Essential (primary) hypertension: Secondary | ICD-10-CM | POA: Insufficient documentation

## 2012-12-27 DIAGNOSIS — M129 Arthropathy, unspecified: Secondary | ICD-10-CM | POA: Insufficient documentation

## 2012-12-27 DIAGNOSIS — IMO0001 Reserved for inherently not codable concepts without codable children: Secondary | ICD-10-CM | POA: Insufficient documentation

## 2012-12-27 DIAGNOSIS — Z862 Personal history of diseases of the blood and blood-forming organs and certain disorders involving the immune mechanism: Secondary | ICD-10-CM | POA: Insufficient documentation

## 2012-12-27 DIAGNOSIS — Z794 Long term (current) use of insulin: Secondary | ICD-10-CM | POA: Insufficient documentation

## 2012-12-27 DIAGNOSIS — M62838 Other muscle spasm: Secondary | ICD-10-CM

## 2012-12-27 DIAGNOSIS — IMO0002 Reserved for concepts with insufficient information to code with codable children: Secondary | ICD-10-CM | POA: Insufficient documentation

## 2012-12-27 DIAGNOSIS — Z79899 Other long term (current) drug therapy: Secondary | ICD-10-CM | POA: Insufficient documentation

## 2012-12-27 DIAGNOSIS — J45909 Unspecified asthma, uncomplicated: Secondary | ICD-10-CM | POA: Insufficient documentation

## 2012-12-27 DIAGNOSIS — G8929 Other chronic pain: Secondary | ICD-10-CM | POA: Insufficient documentation

## 2012-12-27 DIAGNOSIS — E119 Type 2 diabetes mellitus without complications: Secondary | ICD-10-CM | POA: Insufficient documentation

## 2012-12-27 DIAGNOSIS — Z7982 Long term (current) use of aspirin: Secondary | ICD-10-CM | POA: Insufficient documentation

## 2012-12-27 DIAGNOSIS — F3289 Other specified depressive episodes: Secondary | ICD-10-CM | POA: Insufficient documentation

## 2012-12-27 MED ORDER — DIAZEPAM 5 MG PO TABS
10.0000 mg | ORAL_TABLET | Freq: Once | ORAL | Status: AC
  Administered 2012-12-27: 10 mg via ORAL
  Filled 2012-12-27: qty 2

## 2012-12-27 NOTE — ED Notes (Signed)
Pt reports bilateral leg cramps that states around 2000 tonight. Pt now saying they have moved all the way up to her back.

## 2012-12-27 NOTE — ED Provider Notes (Signed)
Scribed for Donnetta Hutching, MD, the patient was seen in room APA07/APA07. This chart was scribed by Lewanda Rife, ED scribe. Patient's care was started at 2333  CSN: [id number]     Arrival date & time 12/27/12  2206 History   First MD Initiated Contact with Patient 12/27/12 2304     Chief Complaint  Patient presents with  . Muscle Pain  . Spasms   (Consider location/radiation/quality/duration/timing/severity/associated sxs/prior Treatment) The history is provided by the patient.   HPI Comments: Lisa Todd is a 57 y.o. female brought in by ambulance, who presents to the Emergency Department complaining of constant improving cramps radiating from feet, back, abdomen and intermittently bilateral hands onset PTA. Denies any precipitating factors. Denies any aggravating or alleviating factors. Reports drinking vinegar and mustard with no relief of symptoms. Reports hx of the same. Reports hx of HTN Reports taking Cardizem for BP.     Past Medical History  Diagnosis Date  . Chronic back pain   . Hyperlipidemia   . Asthma   . Sleep apnea   . Depression   . Obesity   . Hypertension   . IDDM (insulin dependent diabetes mellitus)   . Diabetes mellitus   . Arthritis    Past Surgical History  Procedure Laterality Date  . Vesicovaginal fistula closure w/ tah  2002  . Knee left arthroscopy  2004  . Right knee surgery  2007  . Bladder repair w/ cesarean section  1989  . Right rotator cuff surgery  11/26/2009  . Abdominal hysterectomy     Family History  Problem Relation Age of Onset  . Breast cancer Mother   . Diabetes Mother   . Hypertension Mother   . Asthma Father   . Diabetes Father   . Hypertension Father   . Diabetes Brother   . Hypertension Brother   . Diabetes Sister   . Hypertension Sister    History  Substance Use Topics  . Smoking status: Never Smoker   . Smokeless tobacco: Not on file  . Alcohol Use: No   OB History   Grav Para Term Preterm Abortions TAB SAB  Ect Mult Living                 Review of Systems  Musculoskeletal:       Muscle spasms   All other systems reviewed and are negative.   A complete 10 system review of systems was obtained and all systems are negative except as noted in the HPI and PMH.    Allergies  Liraglutide  Home Medications   Current Outpatient Rx  Name  Route  Sig  Dispense  Refill  . albuterol (ACCUNEB) 1.25 MG/3ML nebulizer solution   Nebulization   Take 3 mLs (1.25 mg total) by nebulization every 6 (six) hours as needed for wheezing.   75 mL   12   . albuterol (PROVENTIL HFA;VENTOLIN HFA) 108 (90 BASE) MCG/ACT inhaler   Inhalation   Inhale 1-2 puffs into the lungs every 6 (six) hours as needed for wheezing.   1 Inhaler   0   . aspirin EC 81 MG tablet   Oral   Take 81 mg by mouth daily.         . Cholecalciferol (VITAMIN D3) 50000 UNITS CAPS   Oral   Take 50,000 Units by mouth once a week. On Mondays         . cyclobenzaprine (FLEXERIL) 10 MG tablet      One  tablet at bedtime, as needed, for muscle spasm   30 tablet   0   . dextromethorphan 15 MG/5ML syrup   Oral   Take 10 mLs (30 mg total) by mouth 4 (four) times daily as needed for cough.   120 mL   0   . diclofenac sodium (VOLTAREN) 1 % GEL   Topical   Apply 1 application topically at bedtime. For arthritis pain         . diltiazem (CARDIZEM CD) 240 MG 24 hr capsule   Oral   Take 240 mg by mouth daily.         Marland Kitchen esomeprazole (NEXIUM) 40 MG capsule   Oral   Take 40 mg by mouth daily before breakfast.         . ibuprofen (MOTRIN) 800 MG tablet   Oral   Take 400 mg by mouth every 6 (six) hours as needed. Pain         . insulin aspart (NOVOLOG FLEXPEN) 100 UNIT/ML injection   Subcutaneous   Inject 9-20 Units into the skin 3 (three) times daily before meals. For sugar per sliding scale         . EXPIRED: insulin glargine (LANTUS) 100 UNIT/ML injection   Subcutaneous   Inject 35 Units into the skin at  bedtime.         . insulin glargine (LANTUS) 100 UNIT/ML injection   Subcutaneous   Inject 70-99 Units into the skin at bedtime.   10 mL   12   . LORazepam (ATIVAN) 1 MG tablet   Oral   Take 1 tablet (1 mg total) by mouth 3 (three) times daily as needed for anxiety.   10 tablet   0   . nitrofurantoin (MACRODANTIN) 100 MG capsule   Oral   Take 1 capsule (100 mg total) by mouth 4 (four) times daily.   14 capsule   0   . predniSONE (DELTASONE) 10 MG tablet   Oral   Take 2 tablets (20 mg total) by mouth daily.   10 tablet   0   . PROAIR HFA 108 (90 BASE) MCG/ACT inhaler      USE 2 PUFFS EVERY 6 TO 8 HOURS AS NEEDED FOR WHEEZING.   8.5 g   3    BP 148/77  Pulse 101  Temp(Src) 98.4 F (36.9 C) (Oral)  Resp 20  Ht 5\' 3"  (1.6 m)  Wt 286 lb (129.729 kg)  BMI 50.68 kg/m2  SpO2 98% Physical Exam  Nursing note and vitals reviewed. Constitutional: She is oriented to person, place, and time. She appears well-developed and well-nourished. No distress.  Morbidly obese   HENT:  Head: Normocephalic and atraumatic.  Eyes: Conjunctivae and EOM are normal.  Neck: Neck supple. No tracheal deviation present.  Cardiovascular: Normal rate, regular rhythm and normal heart sounds.   Pulmonary/Chest: Effort normal and breath sounds normal. No respiratory distress.  Abdominal: Soft.  Musculoskeletal: Normal range of motion.  Neurological: She is alert and oriented to person, place, and time.  Skin: Skin is warm and dry.  Psychiatric: She has a normal mood and affect. Her behavior is normal.    ED Course  Procedures (including critical care time) Medications  diazepam (VALIUM) tablet 10 mg (10 mg Oral Given 12/27/12 2352)    Labs Review Labs Reviewed  CBC WITH DIFFERENTIAL - Abnormal; Notable for the following:    WBC 10.6 (*)    All other components within normal limits  COMPREHENSIVE METABOLIC PANEL - Abnormal; Notable for the following:    Albumin 3.4 (*)    Total  Bilirubin 0.2 (*)    GFR calc non Af Amer 72 (*)    GFR calc Af Amer 84 (*)    All other components within normal limits   Imaging Review No results found.  MDM  No diagnosis found. Complains of muscle cramping which is improved. No abnormalities on physical exam.  Discharge medications Valium 5 mg.  Patient has primary care followup  I personally performed the services described in this documentation, which was scribed in my presence. The recorded information has been reviewed and is accurate.    Donnetta Hutching, MD 12/28/12 [phone]

## 2012-12-27 NOTE — ED Notes (Signed)
Pt standing up in room. Pt moaning out loud. Advised pt EDP would be in to see her in am few minutes.

## 2012-12-27 NOTE — ED Notes (Signed)
Having pain in legs per pt. Patient screaming out in severe pain.

## 2012-12-28 LAB — CBC WITH DIFFERENTIAL/PLATELET
Basophils Relative: 0 % (ref 0–1)
Eosinophils Absolute: 0.1 10*3/uL (ref 0.0–0.7)
MCH: 28.6 pg (ref 26.0–34.0)
MCHC: 32.5 g/dL (ref 30.0–36.0)
Neutrophils Relative %: 67 % (ref 43–77)
Platelets: 169 10*3/uL (ref 150–400)
RBC: 4.76 MIL/uL (ref 3.87–5.11)

## 2012-12-28 LAB — COMPREHENSIVE METABOLIC PANEL
ALT: 13 U/L (ref 0–35)
AST: 12 U/L (ref 0–37)
Albumin: 3.4 g/dL — ABNORMAL LOW (ref 3.5–5.2)
Alkaline Phosphatase: 56 U/L (ref 39–117)
Potassium: 3.7 mEq/L (ref 3.5–5.1)
Sodium: 136 mEq/L (ref 135–145)
Total Protein: 7.5 g/dL (ref 6.0–8.3)

## 2012-12-28 MED ORDER — DIAZEPAM 5 MG PO TABS: 5.0000 mg | ORAL_TABLET | Freq: Three times a day (TID) | ORAL | Status: DC | PRN

## 2012-12-28 NOTE — ED Notes (Signed)
Patient is resting and quiet at this time. Had to wake her to get vitals. Asking to have blood sugar checked because she has not taken her bedtime insulin.Made RN Ron aware.

## 2012-12-28 NOTE — ED Notes (Signed)
Pt alert & oriented x4, stable gait. Patient given discharge instructions, paperwork & prescription(s). Patient  instructed to stop at the registration desk to finish any additional paperwork. Patient verbalized understanding. Pt left department w/ no further questions. 

## 2013-01-18 ENCOUNTER — Emergency Department (HOSPITAL_COMMUNITY)
Admission: EM | Admit: 2013-01-18 | Discharge: 2013-01-18 | Disposition: A | Discharge status: Home / Self Care | Payer: Medicaid Other | Attending: Emergency Medicine | Admitting: Emergency Medicine

## 2013-01-18 ENCOUNTER — Encounter (HOSPITAL_COMMUNITY): Payer: Self-pay

## 2013-01-18 DIAGNOSIS — IMO0001 Reserved for inherently not codable concepts without codable children: Secondary | ICD-10-CM | POA: Insufficient documentation

## 2013-01-18 DIAGNOSIS — Z79899 Other long term (current) drug therapy: Secondary | ICD-10-CM | POA: Insufficient documentation

## 2013-01-18 DIAGNOSIS — E669 Obesity, unspecified: Secondary | ICD-10-CM | POA: Insufficient documentation

## 2013-01-18 DIAGNOSIS — E119 Type 2 diabetes mellitus without complications: Secondary | ICD-10-CM | POA: Insufficient documentation

## 2013-01-18 DIAGNOSIS — M129 Arthropathy, unspecified: Secondary | ICD-10-CM | POA: Insufficient documentation

## 2013-01-18 DIAGNOSIS — Z8639 Personal history of other endocrine, nutritional and metabolic disease: Secondary | ICD-10-CM | POA: Insufficient documentation

## 2013-01-18 DIAGNOSIS — M79609 Pain in unspecified limb: Secondary | ICD-10-CM | POA: Insufficient documentation

## 2013-01-18 DIAGNOSIS — Z794 Long term (current) use of insulin: Secondary | ICD-10-CM | POA: Insufficient documentation

## 2013-01-18 DIAGNOSIS — Z8659 Personal history of other mental and behavioral disorders: Secondary | ICD-10-CM | POA: Insufficient documentation

## 2013-01-18 DIAGNOSIS — Z862 Personal history of diseases of the blood and blood-forming organs and certain disorders involving the immune mechanism: Secondary | ICD-10-CM | POA: Insufficient documentation

## 2013-01-18 DIAGNOSIS — I10 Essential (primary) hypertension: Secondary | ICD-10-CM | POA: Insufficient documentation

## 2013-01-18 DIAGNOSIS — G8929 Other chronic pain: Secondary | ICD-10-CM | POA: Insufficient documentation

## 2013-01-18 DIAGNOSIS — Z7982 Long term (current) use of aspirin: Secondary | ICD-10-CM | POA: Insufficient documentation

## 2013-01-18 DIAGNOSIS — Z792 Long term (current) use of antibiotics: Secondary | ICD-10-CM | POA: Insufficient documentation

## 2013-01-18 DIAGNOSIS — Z791 Long term (current) use of non-steroidal anti-inflammatories (NSAID): Secondary | ICD-10-CM | POA: Insufficient documentation

## 2013-01-18 DIAGNOSIS — R252 Cramp and spasm: Secondary | ICD-10-CM

## 2013-01-18 DIAGNOSIS — J45909 Unspecified asthma, uncomplicated: Secondary | ICD-10-CM | POA: Insufficient documentation

## 2013-01-18 DIAGNOSIS — IMO0002 Reserved for concepts with insufficient information to code with codable children: Secondary | ICD-10-CM | POA: Insufficient documentation

## 2013-01-18 LAB — BASIC METABOLIC PANEL
CO2: 25 mEq/L (ref 19–32)
Calcium: 8.8 mg/dL (ref 8.4–10.5)
Creatinine, Ser: 0.61 mg/dL (ref 0.50–1.10)

## 2013-01-18 LAB — CBC WITH DIFFERENTIAL/PLATELET
Basophils Absolute: 0 10*3/uL (ref 0.0–0.1)
Eosinophils Relative: 1 % (ref 0–5)
HCT: 34.8 % — ABNORMAL LOW (ref 36.0–46.0)
Lymphocytes Relative: 32 % (ref 12–46)
MCHC: 32.5 g/dL (ref 30.0–36.0)
MCV: 87.4 fL (ref 78.0–100.0)
Monocytes Absolute: 0.8 10*3/uL (ref 0.1–1.0)
RDW: 13.6 % (ref 11.5–15.5)

## 2013-01-18 MED ORDER — HYDROCODONE-ACETAMINOPHEN 5-325 MG PO TABS: 1.0000 | ORAL_TABLET | Freq: Four times a day (QID) | ORAL | Status: DC | PRN

## 2013-01-18 MED ORDER — HYDROMORPHONE HCL PF 1 MG/ML IJ SOLN
1.0000 mg | Freq: Once | INTRAMUSCULAR | Status: AC
  Administered 2013-01-18: 1 mg via INTRAVENOUS
  Filled 2013-01-18: qty 1

## 2013-01-18 MED ORDER — LORAZEPAM 2 MG/ML IJ SOLN
1.0000 mg | Freq: Once | INTRAMUSCULAR | Status: AC
Start: 2013-01-18 — End: 2013-01-18
  Administered 2013-01-18: 1 mg via INTRAVENOUS
  Filled 2013-01-18: qty 1

## 2013-01-18 NOTE — ED Notes (Signed)
Pt c/o muscle cramps to both legs and hips, states she has been treated for this in the past with valium but it is not working this time.

## 2013-01-18 NOTE — ED Notes (Signed)
Comes to the ed tonight c/o cramps in her feet and her legs, states that she took a valium about 10pm tonight when it started but that didn't help any. States that this happened to her about 2 weeks ago also.

## 2013-01-19 NOTE — ED Provider Notes (Signed)
CSN: [id number]     Arrival date & time 01/18/13  8295 History   First MD Initiated Contact with Patient 01/18/13 [phone]     Chief Complaint  Patient presents with  . muscle cramps    (Consider location/radiation/quality/duration/timing/severity/associated sxs/prior Treatment) Patient is a 57 y.o. female presenting with leg pain. The history is provided by the patient (pt complains of muscle pain in both legs).  Leg Pain Location:  Leg Leg location:  L leg and R leg Pain details:    Quality:  Aching   Radiates to:  Does not radiate   Severity:  Moderate   Onset quality:  Gradual Associated symptoms: no back pain and no fatigue     Past Medical History  Diagnosis Date  . Chronic back pain   . Hyperlipidemia   . Asthma   . Sleep apnea   . Depression   . Obesity   . Hypertension   . IDDM (insulin dependent diabetes mellitus)   . Diabetes mellitus   . Arthritis    Past Surgical History  Procedure Laterality Date  . Vesicovaginal fistula closure w/ tah  2002  . Knee left arthroscopy  2004  . Right knee surgery  2007  . Bladder repair w/ cesarean section  1989  . Right rotator cuff surgery  11/26/2009  . Abdominal hysterectomy     Family History  Problem Relation Age of Onset  . Breast cancer Mother   . Diabetes Mother   . Hypertension Mother   . Asthma Father   . Diabetes Father   . Hypertension Father   . Diabetes Brother   . Hypertension Brother   . Diabetes Sister   . Hypertension Sister    History  Substance Use Topics  . Smoking status: Never Smoker   . Smokeless tobacco: Not on file  . Alcohol Use: No   OB History   Grav Para Term Preterm Abortions TAB SAB Ect Mult Living                 Review of Systems  Constitutional: Negative for appetite change and fatigue.  HENT: Negative for congestion, sinus pressure and ear discharge.   Eyes: Negative for discharge.  Respiratory: Negative for cough.   Cardiovascular: Negative for chest pain.   Gastrointestinal: Negative for abdominal pain and diarrhea.  Genitourinary: Negative for frequency and hematuria.  Musculoskeletal: Negative for back pain.       Pain in both legs  Skin: Negative for rash.  Neurological: Negative for seizures and headaches.  Psychiatric/Behavioral: Negative for hallucinations.    Allergies  Liraglutide  Home Medications   Current Outpatient Rx  Name  Route  Sig  Dispense  Refill  . albuterol (ACCUNEB) 1.25 MG/3ML nebulizer solution   Nebulization   Take 3 mLs (1.25 mg total) by nebulization every 6 (six) hours as needed for wheezing.   75 mL   12   . albuterol (PROVENTIL HFA;VENTOLIN HFA) 108 (90 BASE) MCG/ACT inhaler   Inhalation   Inhale 1-2 puffs into the lungs every 6 (six) hours as needed for wheezing.   1 Inhaler   0   . aspirin EC 81 MG tablet   Oral   Take 81 mg by mouth daily.         . Cholecalciferol (VITAMIN D3) 50000 UNITS CAPS   Oral   Take 50,000 Units by mouth once a week. On Mondays         . dextromethorphan 15 MG/5ML syrup  Oral   Take 10 mLs (30 mg total) by mouth 4 (four) times daily as needed for cough.   120 mL   0   . diazepam (VALIUM) 5 MG tablet   Oral   Take 1 tablet (5 mg total) by mouth 3 (three) times daily as needed for anxiety.   20 tablet   0   . diclofenac sodium (VOLTAREN) 1 % GEL   Topical   Apply 1 application topically at bedtime. For arthritis pain         . diltiazem (CARDIZEM CD) 240 MG 24 hr capsule   Oral   Take 240 mg by mouth daily.         Marland Kitchen esomeprazole (NEXIUM) 40 MG capsule   Oral   Take 40 mg by mouth daily before breakfast.         . ibuprofen (MOTRIN) 800 MG tablet   Oral   Take 400 mg by mouth every 6 (six) hours as needed. Pain         . insulin aspart (NOVOLOG FLEXPEN) 100 UNIT/ML injection   Subcutaneous   Inject 9-20 Units into the skin 3 (three) times daily before meals. For sugar per sliding scale         . insulin glargine (LANTUS) 100  UNIT/ML injection   Subcutaneous   Inject 70-99 Units into the skin at bedtime.   10 mL   12   . PROAIR HFA 108 (90 BASE) MCG/ACT inhaler      USE 2 PUFFS EVERY 6 TO 8 HOURS AS NEEDED FOR WHEEZING.   8.5 g   3   . cyclobenzaprine (FLEXERIL) 10 MG tablet      One tablet at bedtime, as needed, for muscle spasm   30 tablet   0   . HYDROcodone-acetaminophen (NORCO/VICODIN) 5-325 MG per tablet   Oral   Take 1 tablet by mouth every 6 (six) hours as needed for pain.   20 tablet   0   . EXPIRED: insulin glargine (LANTUS) 100 UNIT/ML injection   Subcutaneous   Inject 35 Units into the skin at bedtime.         Marland Kitchen LORazepam (ATIVAN) 1 MG tablet   Oral   Take 1 tablet (1 mg total) by mouth 3 (three) times daily as needed for anxiety.   10 tablet   0   . nitrofurantoin (MACRODANTIN) 100 MG capsule   Oral   Take 1 capsule (100 mg total) by mouth 4 (four) times daily.   14 capsule   0   . predniSONE (DELTASONE) 10 MG tablet   Oral   Take 2 tablets (20 mg total) by mouth daily.   10 tablet   0    BP 145/72  Pulse 79  Temp(Src) 97.8 F (36.6 C) (Oral)  Resp 24  Ht 5\' 3"  (1.6 m)  Wt 301 lb (136.533 kg)  BMI 53.33 kg/m2  SpO2 99% Physical Exam  Constitutional: She is oriented to person, place, and time. She appears well-developed.  HENT:  Head: Normocephalic.  Eyes: Conjunctivae and EOM are normal. No scleral icterus.  Neck: Neck supple. No thyromegaly present.  Cardiovascular: Normal rate and regular rhythm.  Exam reveals no gallop and no friction rub.   No murmur heard. Pulmonary/Chest: No stridor. She has no wheezes. She has no rales. She exhibits no tenderness.  Abdominal: She exhibits no distension. There is no tenderness. There is no rebound.  Musculoskeletal: Normal range of motion.  Tender  muscles both lower legs  Lymphadenopathy:    She has no cervical adenopathy.  Neurological: She is oriented to person, place, and time. She exhibits normal muscle tone.  Coordination normal.  Skin: No rash noted. No erythema.  Psychiatric: She has a normal mood and affect. Her behavior is normal.    ED Course  Procedures (including critical care time) Labs Review Labs Reviewed  CBC WITH DIFFERENTIAL - Abnormal; Notable for the following:    Hemoglobin 11.3 (*)    HCT 34.8 (*)    All other components within normal limits  BASIC METABOLIC PANEL   Imaging Review No results found.  MDM   1. Muscle cramps        Benny Lennert, MD 01/19/13 [phone]

## 2013-03-23 ENCOUNTER — Other Ambulatory Visit (HOSPITAL_COMMUNITY): Payer: Self-pay | Admitting: Family Medicine

## 2013-03-23 DIAGNOSIS — Z139 Encounter for screening, unspecified: Secondary | ICD-10-CM

## 2013-04-03 ENCOUNTER — Ambulatory Visit (HOSPITAL_COMMUNITY): Payer: Medicaid Other

## 2013-04-09 ENCOUNTER — Emergency Department (HOSPITAL_COMMUNITY)
Admission: EM | Admit: 2013-04-09 | Discharge: 2013-04-09 | Disposition: A | Discharge status: Home / Self Care | Payer: Medicaid Other | Attending: Emergency Medicine | Admitting: Emergency Medicine

## 2013-04-09 ENCOUNTER — Encounter (HOSPITAL_COMMUNITY): Payer: Self-pay | Admitting: Emergency Medicine

## 2013-04-09 DIAGNOSIS — F3289 Other specified depressive episodes: Secondary | ICD-10-CM | POA: Insufficient documentation

## 2013-04-09 DIAGNOSIS — M129 Arthropathy, unspecified: Secondary | ICD-10-CM | POA: Insufficient documentation

## 2013-04-09 DIAGNOSIS — Z794 Long term (current) use of insulin: Secondary | ICD-10-CM | POA: Insufficient documentation

## 2013-04-09 DIAGNOSIS — I10 Essential (primary) hypertension: Secondary | ICD-10-CM | POA: Insufficient documentation

## 2013-04-09 DIAGNOSIS — M79609 Pain in unspecified limb: Secondary | ICD-10-CM | POA: Insufficient documentation

## 2013-04-09 DIAGNOSIS — R252 Cramp and spasm: Secondary | ICD-10-CM | POA: Insufficient documentation

## 2013-04-09 DIAGNOSIS — Z7982 Long term (current) use of aspirin: Secondary | ICD-10-CM | POA: Insufficient documentation

## 2013-04-09 DIAGNOSIS — Z79899 Other long term (current) drug therapy: Secondary | ICD-10-CM | POA: Insufficient documentation

## 2013-04-09 DIAGNOSIS — J45909 Unspecified asthma, uncomplicated: Secondary | ICD-10-CM | POA: Insufficient documentation

## 2013-04-09 DIAGNOSIS — E119 Type 2 diabetes mellitus without complications: Secondary | ICD-10-CM | POA: Insufficient documentation

## 2013-04-09 DIAGNOSIS — E669 Obesity, unspecified: Secondary | ICD-10-CM | POA: Insufficient documentation

## 2013-04-09 DIAGNOSIS — G8929 Other chronic pain: Secondary | ICD-10-CM | POA: Insufficient documentation

## 2013-04-09 DIAGNOSIS — IMO0002 Reserved for concepts with insufficient information to code with codable children: Secondary | ICD-10-CM | POA: Insufficient documentation

## 2013-04-09 DIAGNOSIS — Z791 Long term (current) use of non-steroidal anti-inflammatories (NSAID): Secondary | ICD-10-CM | POA: Insufficient documentation

## 2013-04-09 DIAGNOSIS — F329 Major depressive disorder, single episode, unspecified: Secondary | ICD-10-CM | POA: Insufficient documentation

## 2013-04-09 LAB — BASIC METABOLIC PANEL
BUN: 15 mg/dL (ref 6–23)
Chloride: 100 mEq/L (ref 96–112)
GFR calc non Af Amer: >90 mL/min (ref >90)
Glucose, Bld: 155 mg/dL — ABNORMAL HIGH (ref 70–99)
Potassium: 4.1 mEq/L (ref 3.5–5.1)

## 2013-04-09 LAB — URINE MICROSCOPIC-ADD ON

## 2013-04-09 LAB — URINALYSIS, ROUTINE W REFLEX MICROSCOPIC
Bilirubin Urine: NEGATIVE
Glucose, UA: NEGATIVE mg/dL
Hgb urine dipstick: NEGATIVE
Specific Gravity, Urine: >1.03 — ABNORMAL HIGH (ref 1.005–1.030)
pH: 5.5 (ref 5.0–8.0)

## 2013-04-09 LAB — GLUCOSE, CAPILLARY: Glucose-Capillary: 136 mg/dL — ABNORMAL HIGH (ref 70–99)

## 2013-04-09 MED ORDER — CYCLOBENZAPRINE HCL 10 MG PO TABS
10.0000 mg | ORAL_TABLET | Freq: Once | ORAL | Status: AC
  Administered 2013-04-09: 10 mg via ORAL
  Filled 2013-04-09: qty 1

## 2013-04-09 MED ORDER — CYCLOBENZAPRINE HCL 10 MG PO TABS: 10.0000 mg | ORAL_TABLET | Freq: Three times a day (TID) | ORAL | Status: DC | PRN

## 2013-04-09 MED ORDER — OXYCODONE-ACETAMINOPHEN 5-325 MG PO TABS
1.0000 | ORAL_TABLET | Freq: Once | ORAL | Status: AC
  Administered 2013-04-09: 1 via ORAL
  Filled 2013-04-09: qty 1

## 2013-04-09 NOTE — ED Notes (Signed)
Pt ambulatory to restroom without assistance, update given on plan of care, pt expressed understanding, urine sample obtained,

## 2013-04-09 NOTE — ED Notes (Signed)
Pt refused offer of wheelchair, has daughter's boyfriend on the way to pick her up for ride,

## 2013-04-09 NOTE — ED Notes (Signed)
Pt c/o bilateral leg and feet cramping since midnight, denies any injury, cms intact all extremities, states that she has problems with cramping "at times"

## 2013-04-09 NOTE — ED Provider Notes (Addendum)
CSN: [id number]     Arrival date & time 04/09/13  1191 History  This chart was scribed for Gwyneth Sprout, MD by Leone Payor, ED Scribe. This patient was seen in room APA07/APA07 and the patient's care was started 7:18 AM.    Chief Complaint  Patient presents with  . Leg Pain  . Foot Pain   The history is provided by the patient. No language interpreter was used.    HPI Comments: Lisa Todd is a 57 y.o. female who presents to the Emergency Department complaining of a new episode of constant, unchanged bilateral feet pain that radiates up the legs that began about 7 hours ago. She states the pain occasionally reaches the back. She describes this pain as cramping. She has been prescribed Valium without significant relief. She also reports recently producing urine with an odor. She denies fever, dysuria, vomiting.   Past Medical History  Diagnosis Date  . Chronic back pain   . Hyperlipidemia   . Asthma   . Sleep apnea   . Depression   . Obesity   . Hypertension   . IDDM (insulin dependent diabetes mellitus)   . Diabetes mellitus   . Arthritis    Past Surgical History  Procedure Laterality Date  . Vesicovaginal fistula closure w/ tah  2002  . Knee left arthroscopy  2004  . Right knee surgery  2007  . Bladder repair w/ cesarean section  1989  . Right rotator cuff surgery  11/26/2009  . Abdominal hysterectomy     Family History  Problem Relation Age of Onset  . Breast cancer Mother   . Diabetes Mother   . Hypertension Mother   . Asthma Father   . Diabetes Father   . Hypertension Father   . Diabetes Brother   . Hypertension Brother   . Diabetes Sister   . Hypertension Sister    History  Substance Use Topics  . Smoking status: Never Smoker   . Smokeless tobacco: Not on file  . Alcohol Use: No   OB History   Grav Para Term Preterm Abortions TAB SAB Ect Mult Living                 Review of Systems  Constitutional: Negative for fever.  Gastrointestinal:  Negative for vomiting.  Genitourinary: Negative for dysuria.  Musculoskeletal: Positive for arthralgias (bilateral foot pain) and myalgias ( bilateral leg pain).    Allergies  Liraglutide  Home Medications   Current Outpatient Rx  Name  Route  Sig  Dispense  Refill  . albuterol (ACCUNEB) 1.25 MG/3ML nebulizer solution   Nebulization   Take 3 mLs (1.25 mg total) by nebulization every 6 (six) hours as needed for wheezing.   75 mL   12   . albuterol (PROVENTIL HFA;VENTOLIN HFA) 108 (90 BASE) MCG/ACT inhaler   Inhalation   Inhale 1-2 puffs into the lungs every 6 (six) hours as needed for wheezing.   1 Inhaler   0   . aspirin EC 81 MG tablet   Oral   Take 81 mg by mouth daily.         . Cholecalciferol (VITAMIN D3) 50000 UNITS CAPS   Oral   Take 50,000 Units by mouth once a week. On Mondays         . cyclobenzaprine (FLEXERIL) 10 MG tablet      One tablet at bedtime, as needed, for muscle spasm   30 tablet   0   . dextromethorphan  15 MG/5ML syrup   Oral   Take 10 mLs (30 mg total) by mouth 4 (four) times daily as needed for cough.   120 mL   0   . diazepam (VALIUM) 5 MG tablet   Oral   Take 1 tablet (5 mg total) by mouth 3 (three) times daily as needed for anxiety.   20 tablet   0   . diclofenac sodium (VOLTAREN) 1 % GEL   Topical   Apply 1 application topically at bedtime. For arthritis pain         . diltiazem (CARDIZEM CD) 240 MG 24 hr capsule   Oral   Take 240 mg by mouth daily.         Marland Kitchen esomeprazole (NEXIUM) 40 MG capsule   Oral   Take 40 mg by mouth daily before breakfast.         . HYDROcodone-acetaminophen (NORCO/VICODIN) 5-325 MG per tablet   Oral   Take 1 tablet by mouth every 6 (six) hours as needed for pain.   20 tablet   0   . ibuprofen (MOTRIN) 800 MG tablet   Oral   Take 400 mg by mouth every 6 (six) hours as needed. Pain         . insulin aspart (NOVOLOG FLEXPEN) 100 UNIT/ML injection   Subcutaneous   Inject 9-20 Units  into the skin 3 (three) times daily before meals. For sugar per sliding scale         . EXPIRED: insulin glargine (LANTUS) 100 UNIT/ML injection   Subcutaneous   Inject 35 Units into the skin at bedtime.         . insulin glargine (LANTUS) 100 UNIT/ML injection   Subcutaneous   Inject 70-99 Units into the skin at bedtime.   10 mL   12   . LORazepam (ATIVAN) 1 MG tablet   Oral   Take 1 tablet (1 mg total) by mouth 3 (three) times daily as needed for anxiety.   10 tablet   0   . nitrofurantoin (MACRODANTIN) 100 MG capsule   Oral   Take 1 capsule (100 mg total) by mouth 4 (four) times daily.   14 capsule   0   . predniSONE (DELTASONE) 10 MG tablet   Oral   Take 2 tablets (20 mg total) by mouth daily.   10 tablet   0   . PROAIR HFA 108 (90 BASE) MCG/ACT inhaler      USE 2 PUFFS EVERY 6 TO 8 HOURS AS NEEDED FOR WHEEZING.   8.5 g   3    BP 143/91  Pulse 89  Temp(Src) 98.3 F (36.8 C)  Resp 22  Ht 5\' 3"  (1.6 m)  Wt 290 lb (131.543 kg)  BMI 51.38 kg/m2  SpO2 100% Physical Exam  Nursing note and vitals reviewed. Constitutional: She is oriented to person, place, and time. She appears well-developed and well-nourished.  HENT:  Head: Normocephalic and atraumatic.  Cardiovascular: Normal rate, regular rhythm and normal heart sounds.   Pulmonary/Chest: Effort normal and breath sounds normal. No respiratory distress. She has no wheezes. She has no rales.  Abdominal: She exhibits no distension.  Musculoskeletal: She exhibits no edema and no tenderness.  No tetany or contractures.   Neurological: She is alert and oriented to person, place, and time.  Skin: Skin is warm and dry.  Psychiatric: She has a normal mood and affect.    ED Course  Procedures   DIAGNOSTIC STUDIES: Oxygen  Saturation is 100% on RA, normal by my interpretation.    COORDINATION OF CARE: 7:22 AM Will order BMP and glucose. Discussed treatment plan with pt at bedside and pt agreed to  plan.  Medications  cyclobenzaprine (FLEXERIL) tablet 10 mg (not administered)      Labs Review Labs Reviewed  GLUCOSE, CAPILLARY - Abnormal; Notable for the following:    Glucose-Capillary 136 (*)    All other components within normal limits  BASIC METABOLIC PANEL - Abnormal; Notable for the following:    Glucose, Bld 155 (*)    All other components within normal limits  URINALYSIS, ROUTINE W REFLEX MICROSCOPIC - Abnormal; Notable for the following:    Specific Gravity, Urine >1.030 (*)    Protein, ur TRACE (*)    All other components within normal limits  URINE MICROSCOPIC-ADD ON - Abnormal; Notable for the following:    Squamous Epithelial / LPF MANY (*)    All other components within normal limits   Imaging Review No results found.  EKG Interpretation   None       MDM   1. Muscle cramps     Patient here complaining of bilateral muscle cramps and pain. She denies any infectious symptoms but states her urine has had an odor in her blood sugars have been going up and down. Her blood sugar fluctuation may be the cause of her leg cramping. She states in the past she has not had problems with hypokalemia or problems with her calcium. Patient given Flexeril and the pain tablet. BMP to evaluate potassium and calcium pending and today due to patient's complaint of foul-smelling urine pending.  UA and BMP wnl.  Will d/c home with muscle relaxers.   I personally performed the services described in this documentation, which was scribed in my presence.  The recorded information has been reviewed and considered.   Gwyneth Sprout, MD 04/09/13 [id number]  Gwyneth Sprout, MD 04/09/13 1191

## 2013-04-09 NOTE — ED Notes (Signed)
Pt c/o cramping in bilateral feet and legs since midnight.

## 2013-10-07 ENCOUNTER — Emergency Department (HOSPITAL_COMMUNITY)
Admission: EM | Admit: 2013-10-07 | Discharge: 2013-10-07 | Disposition: A | Discharge status: Home / Self Care | Payer: Medicare Other | Attending: Emergency Medicine | Admitting: Emergency Medicine

## 2013-10-07 ENCOUNTER — Encounter (HOSPITAL_COMMUNITY): Payer: Self-pay | Admitting: Emergency Medicine

## 2013-10-07 DIAGNOSIS — I10 Essential (primary) hypertension: Secondary | ICD-10-CM | POA: Insufficient documentation

## 2013-10-07 DIAGNOSIS — F329 Major depressive disorder, single episode, unspecified: Secondary | ICD-10-CM | POA: Insufficient documentation

## 2013-10-07 DIAGNOSIS — M25562 Pain in left knee: Secondary | ICD-10-CM

## 2013-10-07 DIAGNOSIS — G8929 Other chronic pain: Secondary | ICD-10-CM | POA: Diagnosis not present

## 2013-10-07 DIAGNOSIS — M79609 Pain in unspecified limb: Secondary | ICD-10-CM | POA: Diagnosis present

## 2013-10-07 DIAGNOSIS — Z862 Personal history of diseases of the blood and blood-forming organs and certain disorders involving the immune mechanism: Secondary | ICD-10-CM | POA: Diagnosis not present

## 2013-10-07 DIAGNOSIS — F3289 Other specified depressive episodes: Secondary | ICD-10-CM | POA: Insufficient documentation

## 2013-10-07 DIAGNOSIS — E119 Type 2 diabetes mellitus without complications: Secondary | ICD-10-CM | POA: Insufficient documentation

## 2013-10-07 DIAGNOSIS — Z79899 Other long term (current) drug therapy: Secondary | ICD-10-CM | POA: Insufficient documentation

## 2013-10-07 DIAGNOSIS — J45909 Unspecified asthma, uncomplicated: Secondary | ICD-10-CM | POA: Insufficient documentation

## 2013-10-07 DIAGNOSIS — M129 Arthropathy, unspecified: Secondary | ICD-10-CM | POA: Insufficient documentation

## 2013-10-07 DIAGNOSIS — Z7982 Long term (current) use of aspirin: Secondary | ICD-10-CM | POA: Diagnosis not present

## 2013-10-07 DIAGNOSIS — Z794 Long term (current) use of insulin: Secondary | ICD-10-CM | POA: Insufficient documentation

## 2013-10-07 DIAGNOSIS — M25561 Pain in right knee: Secondary | ICD-10-CM

## 2013-10-07 DIAGNOSIS — Z8639 Personal history of other endocrine, nutritional and metabolic disease: Secondary | ICD-10-CM | POA: Insufficient documentation

## 2013-10-07 MED ORDER — OXYCODONE-ACETAMINOPHEN 5-325 MG PO TABS: 1.0000 | ORAL_TABLET | Freq: Four times a day (QID) | ORAL | Status: DC | PRN

## 2013-10-07 NOTE — ED Notes (Signed)
Pager 4

## 2013-10-07 NOTE — ED Notes (Addendum)
She states shes had bilateral leg pain for the past month. She has a history of back and leg pain and has had steroid shots in the past with some relief. She states "it feels like a fire burning in my legs." Denies bowel/bladder changes. She took percocet at home with no relief

## 2013-10-07 NOTE — ED Provider Notes (Signed)
Medical screening examination/treatment/procedure(s) were performed by non-physician practitioner and as supervising physician I was immediately available for consultation/collaboration.   EKG Interpretation None        Ezequiel Essex, MD 10/07/13 2011

## 2013-10-07 NOTE — ED Notes (Signed)
Patient states that she is supposed to have knee replacement surgery bilaterally.   Patient states unable to get surgery at this time, because her A1c is 11.   Patient states she has been taking percocet at home and it is not working.

## 2013-10-07 NOTE — ED Provider Notes (Signed)
CSN: [id number]     Arrival date & time 10/07/13  1429 History  This chart was scribed for non-physician practitioner working with Ezequiel Essex, MD by Mercy Moore, ED Scribe. This patient was seen in room TR10C/TR10C and the patient's care was started at 4:30 PM.   Chief Complaint  Patient presents with  . Leg Pain     HPI HPI Comments: Lisa Todd is a 58 y.o. female with history of chronic bilateral knee pain who presents to the Emergency Department complaining of knee pain that has been present for the past month.  She denies any acute injury or trauma.  Pain worse with movement of the knees and with ambulation.  She states that she has seen Dr. Alfonso Ramus at [facility] for this pain.  She states that she has been told that she has "bone on bone."  She states that Dr. Alfonso Ramus has recommended a total knee replacement of both knees, but she is unable to have the surgery until she has her DM controlled.  Patient reports past treatment with steroid injections, without relief. Patient reports treatment with Percocet, with temporary relief.  She states that she ran out of the Percocet.  She denies numbness, tingling, fever or chills.  She denies any erythema or warmth of the knees.  She does report mild swelling of both knees.    Patient denies bowel/bladder changes incontinence   Past Medical History  Diagnosis Date  . Chronic back pain   . Hyperlipidemia   . Asthma   . Sleep apnea   . Depression   . Obesity   . Hypertension   . IDDM (insulin dependent diabetes mellitus)   . Diabetes mellitus   . Arthritis    Past Surgical History  Procedure Laterality Date  . Vesicovaginal fistula closure w/ tah  2002  . Knee left arthroscopy  2004  . Right knee surgery  2007  . Bladder repair w/ cesarean section  1989  . Right rotator cuff surgery  11/26/2009  . Abdominal hysterectomy     Family History  Problem Relation Age of Onset  . Breast cancer Mother   . Diabetes Mother    . Hypertension Mother   . Asthma Father   . Diabetes Father   . Hypertension Father   . Diabetes Brother   . Hypertension Brother   . Diabetes Sister   . Hypertension Sister    History  Substance Use Topics  . Smoking status: Never Smoker   . Smokeless tobacco: Not on file  . Alcohol Use: No   OB History   Grav Para Term Preterm Abortions TAB SAB Ect Mult Living                 Review of Systems  Constitutional: Negative for fever.  Musculoskeletal: Positive for arthralgias.      Allergies  Liraglutide  Home Medications   Prior to Admission medications   Medication Sig Start Date End Date Taking? Authorizing Provider  albuterol (PROVENTIL HFA;VENTOLIN HFA) 108 (90 BASE) MCG/ACT inhaler Inhale 1-2 puffs into the lungs every 6 (six) hours as needed for wheezing. 08/05/12  Yes Prentiss Bells, MD  ALPRAZolam Duanne Moron) 1 MG tablet Take 1 tablet by mouth 3 (three) times daily as needed for anxiety or sleep.  09/19/13  Yes Historical Provider, MD  aspirin EC 81 MG tablet Take 81 mg by mouth daily.   Yes Historical Provider, MD  cyclobenzaprine (FLEXERIL) 10 MG tablet Take 1 tablet (10  mg total) by mouth 3 (three) times daily as needed for muscle spasms. 04/09/13  Yes Blanchie Dessert, MD  cyclobenzaprine (FLEXERIL) 10 MG tablet Take 10 mg by mouth at bedtime.   Yes Historical Provider, MD  diltiazem (CARDIZEM CD) 240 MG 24 hr capsule Take 240 mg by mouth daily.   Yes Historical Provider, MD  esomeprazole (NEXIUM) 40 MG capsule Take 40 mg by mouth daily before breakfast.   Yes Historical Provider, MD  glipiZIDE (GLUCOTROL) 5 MG tablet Take 5 mg by mouth 2 (two) times daily. 10/03/13  Yes Historical Provider, MD  ibuprofen (MOTRIN) 800 MG tablet Take 400 mg by mouth every 6 (six) hours as needed. Pain 06/09/11  Yes Fayrene Helper, MD  insulin aspart (NOVOLOG FLEXPEN) 100 UNIT/ML injection Inject 9-25 Units into the skin 3 (three) times daily before meals. For sugar per sliding scale    Yes Historical Provider, MD  oxyCODONE-acetaminophen (PERCOCET) 10-325 MG per tablet Take 1 tablet by mouth 3 (three) times daily as needed for pain.  09/18/13  Yes Historical Provider, MD  PROAIR HFA 108 (90 BASE) MCG/ACT inhaler USE 2 PUFFS EVERY 6 TO 8 HOURS AS NEEDED FOR WHEEZING. 06/07/12  Yes Fayrene Helper, MD  diclofenac sodium (VOLTAREN) 1 % GEL Apply 1 application topically at bedtime. For arthritis pain    Historical Provider, MD  insulin glargine (LANTUS) 100 UNIT/ML injection Inject 60 Units into the skin at bedtime.  02/20/11 02/20/12  Fayrene Helper, MD   Triage Vitals: BP 152/70  Pulse 107  Temp(Src) 99 F (37.2 C) (Oral)  Resp 18  Ht 5\' 3"  (1.6 m)  Wt 318 lb 11.2 oz (144.561 kg)  BMI 56.47 kg/m2  SpO2 96% Physical Exam  Nursing note and vitals reviewed. Constitutional: She is oriented to person, place, and time. She appears well-developed and well-nourished. No distress.  Morbidly obese  HENT:  Head: Normocephalic and atraumatic.  Eyes: EOM are normal.  Neck: Normal range of motion. Neck supple. No tracheal deviation present.  Cardiovascular: Normal rate, regular rhythm and normal heart sounds.   Pulses:      Dorsalis pedis pulses are 2+ on the right side, and 2+ on the left side.  Pulmonary/Chest: Effort normal and breath sounds normal. No respiratory distress.  Musculoskeletal: Normal range of motion.       Right knee: She exhibits no effusion and no erythema. Tenderness found.       Left knee: She exhibits no effusion and no erythema. Tenderness found.  Pain with ROM of both knees No erythema or warmth of both knees No obvious edema of both knees  Neurological: She is alert and oriented to person, place, and time.  Distal sensation of both feet intact  Skin: Skin is warm and dry.  Psychiatric: She has a normal mood and affect. Her behavior is normal.    ED Course  Procedures (including critical care time) COORDINATION OF CARE: 4:34 PM- Discussed  treatment plan with patient at bedside and patient agreed to plan.   Labs Review Labs Reviewed - No data to display  Imaging Review No results found.   EKG Interpretation None      MDM   Final diagnoses:  None  Patient who is morbidly obese with chronic knee pain presents today with bilateral knee pain.  No acute injury or trauma.  No signs of infection.  Patient is afebrile.  Patient has been seen by Orthopedics in the past and told that she needs bilateral  knee replacement.  Patient stable for discharge.  Patient instructed to follow up with Orthopedics.  Return precautions given.  I personally performed the services described in this documentation, which was scribed in my presence. The recorded information has been reviewed and is accurate.    Hyman Bible, PA-C 10/07/13 [phone]

## 2013-10-07 NOTE — ED Notes (Signed)
The pt is asleep  She is still waiting to be seen

## 2013-10-07 NOTE — ED Notes (Signed)
Pt  C/o being cold

## 2014-11-10 ENCOUNTER — Encounter (HOSPITAL_COMMUNITY): Payer: Self-pay | Admitting: *Deleted

## 2014-11-10 ENCOUNTER — Emergency Department (HOSPITAL_COMMUNITY)
Admission: EM | Admit: 2014-11-10 | Discharge: 2014-11-11 | Disposition: A | Discharge status: Home / Self Care | Payer: Medicare Other | Attending: Emergency Medicine | Admitting: Emergency Medicine

## 2014-11-10 DIAGNOSIS — G8929 Other chronic pain: Secondary | ICD-10-CM | POA: Diagnosis not present

## 2014-11-10 DIAGNOSIS — F329 Major depressive disorder, single episode, unspecified: Secondary | ICD-10-CM | POA: Diagnosis not present

## 2014-11-10 DIAGNOSIS — Z794 Long term (current) use of insulin: Secondary | ICD-10-CM | POA: Insufficient documentation

## 2014-11-10 DIAGNOSIS — Z79899 Other long term (current) drug therapy: Secondary | ICD-10-CM | POA: Insufficient documentation

## 2014-11-10 DIAGNOSIS — E119 Type 2 diabetes mellitus without complications: Secondary | ICD-10-CM | POA: Diagnosis not present

## 2014-11-10 DIAGNOSIS — M25561 Pain in right knee: Secondary | ICD-10-CM | POA: Insufficient documentation

## 2014-11-10 DIAGNOSIS — E6609 Other obesity due to excess calories: Secondary | ICD-10-CM | POA: Diagnosis not present

## 2014-11-10 DIAGNOSIS — Z7982 Long term (current) use of aspirin: Secondary | ICD-10-CM | POA: Insufficient documentation

## 2014-11-10 DIAGNOSIS — M25562 Pain in left knee: Secondary | ICD-10-CM | POA: Diagnosis present

## 2014-11-10 DIAGNOSIS — I10 Essential (primary) hypertension: Secondary | ICD-10-CM | POA: Insufficient documentation

## 2014-11-10 DIAGNOSIS — M1990 Unspecified osteoarthritis, unspecified site: Secondary | ICD-10-CM | POA: Insufficient documentation

## 2014-11-10 MED ORDER — OXYCODONE-ACETAMINOPHEN 5-325 MG PO TABS: 1.0000 | ORAL_TABLET | Freq: Four times a day (QID) | ORAL | Status: DC | PRN

## 2014-11-10 MED ORDER — OXYCODONE-ACETAMINOPHEN 5-325 MG PO TABS
1.0000 | ORAL_TABLET | Freq: Once | ORAL | Status: AC
  Administered 2014-11-10: 1 via ORAL
  Filled 2014-11-10: qty 1

## 2014-11-10 MED ORDER — IBUPROFEN 800 MG PO TABS
800.0000 mg | ORAL_TABLET | Freq: Once | ORAL | Status: AC
  Administered 2014-11-10: 800 mg via ORAL
  Filled 2014-11-10: qty 1

## 2014-11-10 MED ORDER — ONDANSETRON HCL 4 MG PO TABS
4.0000 mg | ORAL_TABLET | Freq: Once | ORAL | Status: AC
  Administered 2014-11-10: 4 mg via ORAL
  Filled 2014-11-10: qty 1

## 2014-11-10 MED ORDER — OXYCODONE-ACETAMINOPHEN 5-325 MG PO TABS: 1.0000 | ORAL_TABLET | Freq: Once | ORAL | Status: DC

## 2014-11-10 MED ORDER — DEXAMETHASONE SODIUM PHOSPHATE 4 MG/ML IJ SOLN
8.0000 mg | Freq: Once | INTRAMUSCULAR | Status: AC
  Administered 2014-11-10: 8 mg via INTRAMUSCULAR
  Filled 2014-11-10: qty 2

## 2014-11-10 NOTE — Discharge Instructions (Signed)

## 2014-11-10 NOTE — ED Provider Notes (Signed)
CSN: [id number]     Arrival date & time 11/10/14  2203 History   First MD Initiated Contact with Patient 11/10/14 2256     Chief Complaint  Patient presents with  . Knee Pain     (Consider location/radiation/quality/duration/timing/severity/associated sxs/prior Treatment) Patient is a 59 y.o. female presenting with knee pain. The history is provided by the patient.  Knee Pain Location:  Knee Knee location:  L knee and R knee Pain details:    Quality:  Aching and shooting   Severity:  Moderate   Onset quality:  Gradual   Duration: acute on chronic.   Timing:  Intermittent   Progression:  Worsening Chronicity:  Chronic Dislocation: no   Prior injury to area:  No Relieved by:  Nothing Worsened by:  Nothing tried Ineffective treatments:  None tried Associated symptoms: back pain, decreased ROM and stiffness     Past Medical History  Diagnosis Date  . Chronic back pain   . Hyperlipidemia   . Asthma   . Sleep apnea   . Depression   . Obesity   . Hypertension   . IDDM (insulin dependent diabetes mellitus)   . Diabetes mellitus   . Arthritis    Past Surgical History  Procedure Laterality Date  . Vesicovaginal fistula closure w/ tah  2002  . Knee left arthroscopy  2004  . Right knee surgery  2007  . Bladder repair w/ cesarean section  1989  . Right rotator cuff surgery  11/26/2009  . Abdominal hysterectomy     Family History  Problem Relation Age of Onset  . Breast cancer Mother   . Diabetes Mother   . Hypertension Mother   . Asthma Father   . Diabetes Father   . Hypertension Father   . Diabetes Brother   . Hypertension Brother   . Diabetes Sister   . Hypertension Sister    History  Substance Use Topics  . Smoking status: Never Smoker   . Smokeless tobacco: Not on file  . Alcohol Use: No   OB History    No data available     Review of Systems  Musculoskeletal: Positive for back pain, arthralgias and stiffness.  Psychiatric/Behavioral:        Depression      Allergies  Liraglutide  Home Medications   Prior to Admission medications   Medication Sig Start Date End Date Taking? Authorizing Provider  albuterol (PROVENTIL HFA;VENTOLIN HFA) 108 (90 BASE) MCG/ACT inhaler Inhale 1-2 puffs into the lungs every 6 (six) hours as needed for wheezing. 08/05/12   Prentiss Bells, MD  ALPRAZolam Duanne Moron) 1 MG tablet Take 1 tablet by mouth 3 (three) times daily as needed for anxiety or sleep.  09/19/13   Historical Provider, MD  aspirin EC 81 MG tablet Take 81 mg by mouth daily.    Historical Provider, MD  cyclobenzaprine (FLEXERIL) 10 MG tablet Take 1 tablet (10 mg total) by mouth 3 (three) times daily as needed for muscle spasms. 04/09/13   Blanchie Dessert, MD  cyclobenzaprine (FLEXERIL) 10 MG tablet Take 10 mg by mouth at bedtime.    Historical Provider, MD  diclofenac sodium (VOLTAREN) 1 % GEL Apply 1 application topically at bedtime. For arthritis pain    Historical Provider, MD  diltiazem (CARDIZEM CD) 240 MG 24 hr capsule Take 240 mg by mouth daily.    Historical Provider, MD  esomeprazole (NEXIUM) 40 MG capsule Take 40 mg by mouth daily before breakfast.    Historical Provider,  MD  glipiZIDE (GLUCOTROL) 5 MG tablet Take 5 mg by mouth 2 (two) times daily. 10/03/13   Historical Provider, MD  ibuprofen (MOTRIN) 800 MG tablet Take 400 mg by mouth every 6 (six) hours as needed. Pain 06/09/11   Fayrene Helper, MD  insulin aspart (NOVOLOG FLEXPEN) 100 UNIT/ML injection Inject 9-25 Units into the skin 3 (three) times daily before meals. For sugar per sliding scale    Historical Provider, MD  insulin glargine (LANTUS) 100 UNIT/ML injection Inject 60 Units into the skin at bedtime.  02/20/11 02/20/12  Fayrene Helper, MD  oxyCODONE-acetaminophen (PERCOCET) 10-325 MG per tablet Take 1 tablet by mouth 3 (three) times daily as needed for pain.  09/18/13   Historical Provider, MD  oxyCODONE-acetaminophen (PERCOCET/ROXICET) 5-325 MG per tablet Take 1-2  tablets by mouth every 6 (six) hours as needed for severe pain. 10/07/13   Heather Laisure, PA-C  PROAIR HFA 108 (90 BASE) MCG/ACT inhaler USE 2 PUFFS EVERY 6 TO 8 HOURS AS NEEDED FOR WHEEZING. 06/07/12   Fayrene Helper, MD   BP 156/88 mmHg  Pulse 85  Temp(Src) 98.5 F (36.9 C) (Oral)  Resp 18  Ht 5\' 3"  (1.6 m)  Wt 337 lb 1 oz (152.891 kg)  BMI 59.72 kg/m2  SpO2 100% Physical Exam  Constitutional: She is oriented to person, place, and time. She appears well-developed and well-nourished.  Non-toxic appearance.  HENT:  Head: Normocephalic.  Right Ear: Tympanic membrane and external ear normal.  Left Ear: Tympanic membrane and external ear normal.  Eyes: EOM and lids are normal. Pupils are equal, round, and reactive to light.  Neck: Normal range of motion. Neck supple. Carotid bruit is not present.  Cardiovascular: Normal rate, regular rhythm, normal heart sounds, intact distal pulses and normal pulses.   Pulmonary/Chest: Breath sounds normal. No respiratory distress.  Abdominal: Soft. Bowel sounds are normal. There is no tenderness. There is no guarding.  Musculoskeletal:       Right knee: She exhibits decreased range of motion and swelling. She exhibits no deformity. Tenderness found. Medial joint line tenderness noted.       Left knee: She exhibits decreased range of motion and swelling. She exhibits no deformity. Tenderness found.  Lymphadenopathy:       Head (right side): No submandibular adenopathy present.       Head (left side): No submandibular adenopathy present.    She has no cervical adenopathy.  Neurological: She is alert and oriented to person, place, and time. She has normal strength. No cranial nerve deficit or sensory deficit.  Skin: Skin is warm and dry.  Psychiatric: She has a normal mood and affect. Her speech is normal.  Nursing note and vitals reviewed.   ED Course  Procedures (including critical care time) Labs Review Labs Reviewed - No data to  display  Imaging Review No results found.   EKG Interpretation None      MDM  Vital signs stable No evidence for septic joint. Pt will be treated with percocet  And diclofenac.   Final diagnoses:  None    **I have reviewed nursing notes, vital signs, and all appropriate lab and imaging results for this patient.Lily Kocher, PA-C 11/10/14 9675  Rolland Porter, MD 11/10/14 [phone]

## 2014-11-10 NOTE — ED Notes (Signed)
Bilateral knee pain R>L x 2 weeks.  Unable to get appointment w/ortho MD and Dr. Marga Hoots is out of town.  Denies any recent injury. Hx of arthritis bilaterally - patient states it is "bone-on-bone".

## 2014-11-11 NOTE — ED Notes (Signed)
Pt alert & oriented x4, stable gait. Patient  given discharge instructions, paperwork & prescription(s). Patient informed not to drive, operate any equipment & handel any important documents 4 hours after taking pain medication. Patient  instructed to stop at the registration desk to finish any additional paperwork. Patient  verbalized understanding. Pt left department by wheelchair w/ no further questions.

## 2014-12-03 ENCOUNTER — Emergency Department (HOSPITAL_COMMUNITY): Payer: Medicare Other

## 2014-12-03 ENCOUNTER — Emergency Department (HOSPITAL_COMMUNITY)
Admission: EM | Admit: 2014-12-03 | Discharge: 2014-12-03 | Disposition: A | Discharge status: Home / Self Care | Payer: Medicare Other | Attending: Emergency Medicine | Admitting: Emergency Medicine

## 2014-12-03 ENCOUNTER — Encounter (HOSPITAL_COMMUNITY): Payer: Self-pay | Admitting: Emergency Medicine

## 2014-12-03 DIAGNOSIS — I10 Essential (primary) hypertension: Secondary | ICD-10-CM | POA: Diagnosis not present

## 2014-12-03 DIAGNOSIS — Z794 Long term (current) use of insulin: Secondary | ICD-10-CM | POA: Diagnosis not present

## 2014-12-03 DIAGNOSIS — G8929 Other chronic pain: Secondary | ICD-10-CM | POA: Insufficient documentation

## 2014-12-03 DIAGNOSIS — E119 Type 2 diabetes mellitus without complications: Secondary | ICD-10-CM | POA: Insufficient documentation

## 2014-12-03 DIAGNOSIS — M1990 Unspecified osteoarthritis, unspecified site: Secondary | ICD-10-CM | POA: Diagnosis not present

## 2014-12-03 DIAGNOSIS — F329 Major depressive disorder, single episode, unspecified: Secondary | ICD-10-CM | POA: Diagnosis not present

## 2014-12-03 DIAGNOSIS — M1711 Unilateral primary osteoarthritis, right knee: Secondary | ICD-10-CM | POA: Insufficient documentation

## 2014-12-03 DIAGNOSIS — Z79899 Other long term (current) drug therapy: Secondary | ICD-10-CM | POA: Insufficient documentation

## 2014-12-03 DIAGNOSIS — Z7982 Long term (current) use of aspirin: Secondary | ICD-10-CM | POA: Insufficient documentation

## 2014-12-03 DIAGNOSIS — M25561 Pain in right knee: Secondary | ICD-10-CM | POA: Diagnosis present

## 2014-12-03 DIAGNOSIS — J45909 Unspecified asthma, uncomplicated: Secondary | ICD-10-CM | POA: Diagnosis not present

## 2014-12-03 LAB — CBG MONITORING, ED: Glucose-Capillary: 128 mg/dL — ABNORMAL HIGH (ref 65–99)

## 2014-12-03 MED ORDER — KETOROLAC TROMETHAMINE 60 MG/2ML IM SOLN
30.0000 mg | Freq: Once | INTRAMUSCULAR | Status: AC
  Administered 2014-12-03: 30 mg via INTRAMUSCULAR
  Filled 2014-12-03: qty 2

## 2014-12-03 MED ORDER — OXYCODONE-ACETAMINOPHEN 5-325 MG PO TABS
1.0000 | ORAL_TABLET | Freq: Once | ORAL | Status: AC
  Administered 2014-12-03: 1 via ORAL
  Filled 2014-12-03: qty 1

## 2014-12-03 MED ORDER — HYDROCODONE-ACETAMINOPHEN 5-325 MG PO TABS: 1.0000 | ORAL_TABLET | ORAL | Status: DC | PRN

## 2014-12-03 MED ORDER — IBUPROFEN 600 MG PO TABS: 600.0000 mg | ORAL_TABLET | Freq: Four times a day (QID) | ORAL | Status: DC | PRN

## 2014-12-03 NOTE — ED Notes (Signed)
Pt seen and evaluated by EDNP for initial assessment. 

## 2014-12-03 NOTE — Discharge Instructions (Signed)
Your x-ray today does not show a broken bone or dislocation. It does show osteoarthritis in the knee. Follow up with Dr. Karie Kirks for continued pain management until you can get your knee replacement as planned.

## 2014-12-03 NOTE — ED Provider Notes (Signed)
CSN: [id number]     Arrival date & time 12/03/14  1739 History  This chart was scribed for non-physician practitioner Debroah Baller, NP, working with Leonard Schwartz, MD by Zola Button, ED Scribe. This patient was seen in room APFT23/APFT23 and the patient's care was started at 7:15 PM.    Chief Complaint  Patient presents with  . Knee Pain   Patient is a 59 y.o. female presenting with knee pain. The history is provided by the patient. No language interpreter was used.  Knee Pain Location:  Knee Time since incident:  1 week Knee location:  R knee Pain details:    Onset quality:  Sudden   Duration:  1 week Worsened by:  Bearing weight and activity Associated symptoms: no fever    HPI Comments: Lisa Todd is a 59 y.o. female with a history of DM, arthritis, and obesity who presents to the Emergency Department complaining of sudden onset right knee pain with swelling that started about a week ago when her knee gave out. She did feel a pop at that time. Since then, she has been unable to bear weight. The pain is worse with bending. She has been applying ice every night. Patient notes that she has had problems with her knee for a long time. She was supposed to have a right knee arthroscopy 2 months ago by Dr. Maretta Los, but it was deferred due to her weight. She was told to lose 40 pounds before surgery.   Past Medical History  Diagnosis Date  . Chronic back pain   . Hyperlipidemia   . Asthma   . Sleep apnea   . Depression   . Obesity   . Hypertension   . IDDM (insulin dependent diabetes mellitus)   . Diabetes mellitus   . Arthritis    Past Surgical History  Procedure Laterality Date  . Vesicovaginal fistula closure w/ tah  2002  . Knee left arthroscopy  2004  . Right knee surgery  2007  . Bladder repair w/ cesarean section  1989  . Right rotator cuff surgery  11/26/2009  . Abdominal hysterectomy     Family History  Problem Relation Age of Onset  . Breast cancer Mother   .  Diabetes Mother   . Hypertension Mother   . Asthma Father   . Diabetes Father   . Hypertension Father   . Diabetes Brother   . Hypertension Brother   . Diabetes Sister   . Hypertension Sister    Social History  Substance Use Topics  . Smoking status: Never Smoker   . Smokeless tobacco: None  . Alcohol Use: No   OB History    No data available     Review of Systems  Constitutional: Negative for fever.  Musculoskeletal: Positive for joint swelling and arthralgias.  all other systems negative    Allergies  Liraglutide  Home Medications   Prior to Admission medications   Medication Sig Start Date End Date Taking? Authorizing Provider  albuterol (PROVENTIL HFA;VENTOLIN HFA) 108 (90 BASE) MCG/ACT inhaler Inhale 1-2 puffs into the lungs every 6 (six) hours as needed for wheezing. 08/05/12   Prentiss Bells, MD  ALPRAZolam Duanne Moron) 1 MG tablet Take 1 tablet by mouth 3 (three) times daily as needed for anxiety or sleep.  09/19/13   Historical Provider, MD  aspirin EC 81 MG tablet Take 81 mg by mouth daily.    Historical Provider, MD  cyclobenzaprine (FLEXERIL) 10 MG tablet Take 1 tablet (10  mg total) by mouth 3 (three) times daily as needed for muscle spasms. 04/09/13   Blanchie Dessert, MD  cyclobenzaprine (FLEXERIL) 10 MG tablet Take 10 mg by mouth at bedtime.    Historical Provider, MD  diclofenac sodium (VOLTAREN) 1 % GEL Apply 1 application topically at bedtime. For arthritis pain    Historical Provider, MD  diltiazem (CARDIZEM CD) 240 MG 24 hr capsule Take 240 mg by mouth daily.    Historical Provider, MD  esomeprazole (NEXIUM) 40 MG capsule Take 40 mg by mouth daily before breakfast.    Historical Provider, MD  glipiZIDE (GLUCOTROL) 5 MG tablet Take 5 mg by mouth 2 (two) times daily. 10/03/13   Historical Provider, MD  HYDROcodone-acetaminophen (NORCO/VICODIN) 5-325 MG per tablet Take 1 tablet by mouth every 4 (four) hours as needed. 12/03/14   Tallyn Holroyd Bunnie Pion, NP  ibuprofen  (ADVIL,MOTRIN) 600 MG tablet Take 1 tablet (600 mg total) by mouth every 6 (six) hours as needed. 12/03/14   Synia Douglass Bunnie Pion, NP  insulin aspart (NOVOLOG FLEXPEN) 100 UNIT/ML injection Inject 9-25 Units into the skin 3 (three) times daily before meals. For sugar per sliding scale    Historical Provider, MD  insulin glargine (LANTUS) 100 UNIT/ML injection Inject 60 Units into the skin at bedtime.  02/20/11 02/20/12  Fayrene Helper, MD  oxyCODONE-acetaminophen (PERCOCET/ROXICET) 5-325 MG per tablet Take 1 tablet by mouth every 6 (six) hours as needed. 11/10/14   Lily Kocher, PA-C  PROAIR HFA 108 (90 BASE) MCG/ACT inhaler USE 2 PUFFS EVERY 6 TO 8 HOURS AS NEEDED FOR WHEEZING. 06/07/12   Fayrene Helper, MD   BP 122/63 mmHg  Pulse 81  Temp(Src) 98 F (36.7 C) (Oral)  Resp 20  Ht 5\' 3"  (1.6 m)  Wt 339 lb 1.6 oz (153.815 kg)  BMI 60.08 kg/m2  SpO2 100% Physical Exam  Constitutional: She is oriented to person, place, and time. No distress.  Morbidly obese  HENT:  Head: Normocephalic and atraumatic.  Mouth/Throat: Oropharynx is clear and moist. No oropharyngeal exudate.  Eyes: Pupils are equal, round, and reactive to light.  Neck: Neck supple.  Cardiovascular: Normal rate.   Pulses 2+. Good circulation.  Pulmonary/Chest: Effort normal.  Musculoskeletal:       Right knee: She exhibits ecchymosis (lateral aspect). She exhibits no laceration, no erythema, normal alignment and normal patellar mobility. Decreased range of motion: due to pain. Swelling: minimal. Tenderness found. LCL tenderness noted.  Pedal pulses 2+, adequate circulation, good touch sensation   Neurological: She is alert and oriented to person, place, and time. No cranial nerve deficit.  Skin: Skin is warm and dry. No rash noted.  Ecchymoses over lateral aspect of knee.   Psychiatric: She has a normal mood and affect. Her behavior is normal.  Nursing note and vitals reviewed.   ED Course  Procedures  DIAGNOSTIC  STUDIES: Oxygen Saturation is 100% on room air, normal by my interpretation.    COORDINATION OF CARE: 7:19 PM-Discussed treatment plan which includes XR imaging and pain medication with patient/guardian at bedside and patient/guardian agreed to plan.    Labs Review Labs Reviewed  CBG MONITORING, ED - Abnormal; Notable for the following:    Glucose-Capillary 128 (*)    All other components within normal limits    Imaging Review Dg Knee Complete 4 Views Right  12/03/2014   CLINICAL DATA:  Hx: DJD of right knee, Due to weight unable to have knee replacement. 2 days ago, her knee  gave away and she fell. Increased pain and unable to bear weight since then.  EXAM: RIGHT KNEE - COMPLETE 4+ VIEW  COMPARISON:  07/11/2010  FINDINGS: No fracture. No dislocation. There is tricompartmental osteoarthritis with joint space narrowing, mostly of the medial compartment, at and marginal osteophytes. There is no joint effusion. The soft tissues are unremarkable. Degenerative changes are similar to the prior radiographs.  IMPRESSION: Tricompartmental osteoarthritis.  No fracture or acute finding.   Electronically Signed   By: Lajean Manes M.D.   On: 12/03/2014 19:47   I personally reviewed and evaluated these images and lab results as part of my medical decision-making.   EKG Interpretation None      MDM  59 y.o. morbidly obese female with chronic knee pain and recent injury to the right knee. Stable for d/c without neurovascular compromise. Ace wrap applied, ice, elevation and she will follow up with her PCP for chronic pain management and with the orthopedic doctor as scheduled for her knee. Will give her 10 hydrocodone.   Final diagnoses:  Osteoarthritis of right knee, unspecified osteoarthritis type   I personally performed the services described in this documentation, which was scribed in my presence. The recorded information has been reviewed and is accurate.     [address] [place], [place] 12/03/14  2019  Leonard Schwartz, MD 12/06/14 [phone]

## 2014-12-03 NOTE — ED Notes (Signed)
Pt c/o falling onto right knee x 1 week ago. Pt c/o continued pain.

## 2014-12-19 ENCOUNTER — Other Ambulatory Visit (HOSPITAL_COMMUNITY): Payer: Self-pay | Admitting: Respiratory Therapy

## 2014-12-19 DIAGNOSIS — F409 Phobic anxiety disorder, unspecified: Secondary | ICD-10-CM

## 2014-12-19 DIAGNOSIS — R5383 Other fatigue: Secondary | ICD-10-CM

## 2014-12-19 DIAGNOSIS — G4733 Obstructive sleep apnea (adult) (pediatric): Secondary | ICD-10-CM

## 2014-12-19 DIAGNOSIS — F5105 Insomnia due to other mental disorder: Secondary | ICD-10-CM

## 2014-12-19 DIAGNOSIS — G4459 Other complicated headache syndrome: Secondary | ICD-10-CM

## 2015-01-08 ENCOUNTER — Other Ambulatory Visit (HOSPITAL_COMMUNITY): Payer: Self-pay | Admitting: Family Medicine

## 2015-01-08 ENCOUNTER — Emergency Department (HOSPITAL_COMMUNITY): Admission: EM | Admit: 2015-01-08 | Discharge: 2015-01-08 | Payer: Medicare Other

## 2015-01-08 ENCOUNTER — Ambulatory Visit (HOSPITAL_COMMUNITY)
Admission: RE | Admit: 2015-01-08 | Discharge: 2015-01-08 | Disposition: A | Discharge status: Home / Self Care | Payer: Medicare Other | Source: Ambulatory Visit | Attending: Family Medicine | Admitting: Family Medicine

## 2015-01-08 DIAGNOSIS — R059 Cough, unspecified: Secondary | ICD-10-CM

## 2015-01-08 DIAGNOSIS — R0602 Shortness of breath: Secondary | ICD-10-CM | POA: Diagnosis not present

## 2015-01-08 DIAGNOSIS — R509 Fever, unspecified: Secondary | ICD-10-CM | POA: Insufficient documentation

## 2015-01-08 DIAGNOSIS — R05 Cough: Secondary | ICD-10-CM | POA: Insufficient documentation

## 2015-02-12 ENCOUNTER — Ambulatory Visit: Payer: Medicare Other | Attending: Neurology | Admitting: Sleep Medicine

## 2015-02-12 DIAGNOSIS — G4700 Insomnia, unspecified: Secondary | ICD-10-CM | POA: Diagnosis not present

## 2015-02-12 DIAGNOSIS — F5105 Insomnia due to other mental disorder: Secondary | ICD-10-CM

## 2015-02-12 DIAGNOSIS — F409 Phobic anxiety disorder, unspecified: Secondary | ICD-10-CM

## 2015-02-12 DIAGNOSIS — Z79899 Other long term (current) drug therapy: Secondary | ICD-10-CM | POA: Diagnosis not present

## 2015-02-12 DIAGNOSIS — R5383 Other fatigue: Secondary | ICD-10-CM | POA: Diagnosis present

## 2015-02-12 DIAGNOSIS — R51 Headache: Secondary | ICD-10-CM | POA: Diagnosis not present

## 2015-02-12 DIAGNOSIS — Z794 Long term (current) use of insulin: Secondary | ICD-10-CM | POA: Diagnosis not present

## 2015-02-12 DIAGNOSIS — G4459 Other complicated headache syndrome: Secondary | ICD-10-CM

## 2015-02-12 DIAGNOSIS — G4733 Obstructive sleep apnea (adult) (pediatric): Secondary | ICD-10-CM | POA: Diagnosis not present

## 2015-02-23 NOTE — Sleep Study (Signed)
Warner A. Merlene Laughter, MD     [email or web address]             NOCTURNAL POLYSOMNOGRAPHY   LOCATION: [facility]  Patient Name: Lisa Todd, Lisa Todd Date: 02/12/2015 Gender: Female D.O.B: 03-04-1956 Age (years): 65 Referring Provider: Phillips Odor MD, ABSM Height (inches): 63 Interpreting Physician: Phillips Odor MD, ABSM Weight (lbs): 330 RPSGT: Rosebud Poles BMI: 58 MRN: [id number] Neck Size: 16.50 CLINICAL INFORMATION Sleep Study Type: NPSG Indication for sleep study: Fatigue, Insomnia, Morning Headaches, Witnesses Apnea / Gasping During Sleep Epworth Sleepiness Score: 17 SLEEP STUDY TECHNIQUE As per the AASM Manual for the Scoring of Sleep and Associated Events v2.3 (April 2016) with a hypopnea requiring 4% desaturations. The channels recorded and monitored were frontal, central and occipital EEG, electrooculogram (EOG), submentalis EMG (chin), nasal and oral airflow, thoracic and abdominal wall motion, anterior tibialis EMG, snore microphone, electrocardiogram, and pulse oximetry. MEDICATIONS Prior to Admission medications   Medication Sig Start Date End Date Taking? Authorizing Provider  albuterol (PROVENTIL HFA;VENTOLIN HFA) 108 (90 BASE) MCG/ACT inhaler Inhale 1-2 puffs into the lungs every 6 (six) hours as needed for wheezing. 08/05/12   Prentiss Bells, MD  ALPRAZolam Duanne Moron) 1 MG tablet Take 1 tablet by mouth 3 (three) times daily as needed for anxiety or sleep.  09/19/13   Historical Provider, MD  aspirin EC 81 MG tablet Take 81 mg by mouth daily.    Historical Provider, MD  cyclobenzaprine (FLEXERIL) 10 MG tablet Take 1 tablet (10 mg total) by mouth 3 (three) times daily as needed for muscle spasms. 04/09/13   Blanchie Dessert, MD  cyclobenzaprine (FLEXERIL) 10 MG tablet Take 10 mg by mouth at bedtime.    Historical Provider, MD  diclofenac sodium (VOLTAREN) 1 % GEL Apply 1 application topically at bedtime. For arthritis pain    Historical Provider,  MD  diltiazem (CARDIZEM CD) 240 MG 24 hr capsule Take 240 mg by mouth daily.    Historical Provider, MD  esomeprazole (NEXIUM) 40 MG capsule Take 40 mg by mouth daily before breakfast.    Historical Provider, MD  glipiZIDE (GLUCOTROL) 5 MG tablet Take 5 mg by mouth 2 (two) times daily. 10/03/13   Historical Provider, MD  HYDROcodone-acetaminophen (NORCO/VICODIN) 5-325 MG per tablet Take 1 tablet by mouth every 4 (four) hours as needed. 12/03/14   Hope Bunnie Pion, NP  ibuprofen (ADVIL,MOTRIN) 600 MG tablet Take 1 tablet (600 mg total) by mouth every 6 (six) hours as needed. 12/03/14   Hope Bunnie Pion, NP  insulin aspart (NOVOLOG FLEXPEN) 100 UNIT/ML injection Inject 9-25 Units into the skin 3 (three) times daily before meals. For sugar per sliding scale    Historical Provider, MD  insulin glargine (LANTUS) 100 UNIT/ML injection Inject 60 Units into the skin at bedtime.  02/20/11 02/20/12  Fayrene Helper, MD  oxyCODONE-acetaminophen (PERCOCET/ROXICET) 5-325 MG per tablet Take 1 tablet by mouth every 6 (six) hours as needed. 11/10/14   Lily Kocher, PA-C  PROAIR HFA 108 (90 BASE) MCG/ACT inhaler USE 2 PUFFS EVERY 6 TO 8 HOURS AS NEEDED FOR WHEEZING. 06/07/12   Fayrene Helper, MD    SLEEP ARCHITECTURE The study was initiated at 11:08:22 PM and ended at 5:07:46 AM. Sleep onset time was 1.2 minutes and the sleep efficiency was 90.6%. The total sleep time was 325.7 minutes. Stage REM latency was 204.5 minutes. The patient spent 3.84% of the night in stage N1 sleep, 62.70% in stage N2 sleep, 29.01% in stage N3  and 4.45% in REM. Alpha intrusion was absent. Supine sleep was 17.88%. RESPIRATORY PARAMETERS The overall apnea/hypopnea index (AHI) was 7.7 per hour. There were 2 total apneas, including 2 obstructive, 0 central and 0 mixed apneas. There were 40 hypopneas and 0 RERAs. The AHI during Stage REM sleep was 86.9 per hour. AHI while supine was 0.0 per hour. The mean oxygen saturation was 94.91%. The  minimum SpO2 during sleep was 68.00%. Loud snoring was noted during this study. CARDIAC DATA The 2 lead EKG demonstrated sinus rhythm. The mean heart rate was 76.44 beats per minute. Other EKG findings include: Ventricular Tachycardia. LEG MOVEMENT DATA The total PLMS were 0 with a resulting PLMS index of 0.00. Associated arousal with leg movement index was 0.0  IMPRESSIONS - Mild obstructive sleep apnea worse during REM sleep. Severe oxygen desaturation  associated with REM events.   Delano Metz, MD Diplomate, American Board of Sleep Medicine.

## 2015-03-07 ENCOUNTER — Other Ambulatory Visit (HOSPITAL_COMMUNITY): Payer: Self-pay | Admitting: Family Medicine

## 2015-03-07 DIAGNOSIS — Z1231 Encounter for screening mammogram for malignant neoplasm of breast: Secondary | ICD-10-CM

## 2015-03-13 ENCOUNTER — Ambulatory Visit (HOSPITAL_COMMUNITY): Payer: Medicare Other

## 2015-03-18 ENCOUNTER — Other Ambulatory Visit (HOSPITAL_COMMUNITY): Payer: Self-pay | Admitting: General Surgery

## 2015-03-18 ENCOUNTER — Ambulatory Visit: Payer: Medicare Other | Admitting: Dietician

## 2015-03-22 ENCOUNTER — Ambulatory Visit (HOSPITAL_COMMUNITY): Payer: Medicare Other

## 2015-03-25 ENCOUNTER — Ambulatory Visit (HOSPITAL_COMMUNITY): Payer: Medicare Other

## 2015-03-25 ENCOUNTER — Ambulatory Visit (HOSPITAL_COMMUNITY)
Admission: RE | Admit: 2015-03-25 | Discharge: 2015-03-25 | Disposition: A | Discharge status: Home / Self Care | Payer: Medicare Other | Source: Ambulatory Visit | Attending: Family Medicine | Admitting: Family Medicine

## 2015-03-25 DIAGNOSIS — Z1231 Encounter for screening mammogram for malignant neoplasm of breast: Secondary | ICD-10-CM | POA: Diagnosis not present

## 2015-03-26 ENCOUNTER — Ambulatory Visit (INDEPENDENT_AMBULATORY_CARE_PROVIDER_SITE_OTHER): Payer: Medicare Other | Admitting: Psychiatry

## 2015-03-28 ENCOUNTER — Ambulatory Visit (HOSPITAL_COMMUNITY)
Admission: RE | Admit: 2015-03-28 | Discharge: 2015-03-28 | Disposition: A | Discharge status: Home / Self Care | Payer: Medicare Other | Source: Ambulatory Visit | Attending: General Surgery | Admitting: General Surgery

## 2015-03-28 DIAGNOSIS — Z01810 Encounter for preprocedural cardiovascular examination: Secondary | ICD-10-CM | POA: Insufficient documentation

## 2015-04-04 ENCOUNTER — Ambulatory Visit (INDEPENDENT_AMBULATORY_CARE_PROVIDER_SITE_OTHER): Payer: Medicare Other | Admitting: Psychiatry

## 2015-04-10 ENCOUNTER — Encounter: Payer: Medicare Other | Attending: General Surgery | Admitting: Dietician

## 2015-04-10 ENCOUNTER — Encounter: Payer: Self-pay | Admitting: Dietician

## 2015-04-10 VITALS — Ht 63.0 in | Wt 328.6 lb

## 2015-04-10 DIAGNOSIS — E669 Obesity, unspecified: Secondary | ICD-10-CM | POA: Diagnosis present

## 2015-04-10 DIAGNOSIS — Z6841 Body Mass Index (BMI) 40.0 and over, adult: Secondary | ICD-10-CM | POA: Insufficient documentation

## 2015-04-10 DIAGNOSIS — Z713 Dietary counseling and surveillance: Secondary | ICD-10-CM | POA: Insufficient documentation

## 2015-04-10 NOTE — Patient Instructions (Addendum)
Follow Pre-Op Goals Try Protein Shakes Call [facility] at [phone] when surgery is scheduled to enroll in Pre-Op Class Chewable complete multivitamin (Flinstones Complete), Chewable Calcium Citrate ([employer] - [place]), 500 mcg per day Vitamin B12 (liquid or dissolves under tongue/sublingual)  Things to remember:  Please always be honest with [place]. We want to support you!  If you have any questions or concerns in between appointments, please call or email Ferol Luz, or Margarita Grizzle.  The diet after surgery will be high protein and low in carbohydrate.  Vitamins and calcium need to be taken for the rest of your life.  Feel free to include support people in any classes or appointments.

## 2015-04-10 NOTE — Progress Notes (Signed)
  Pre-Op Assessment Visit:  Pre-Operative RYGB Surgery  Medical Nutrition Therapy:  Appt start time: [id number]   End time:  [id number].  Patient was seen on 04/10/2015 for Pre-Operative Nutrition Assessment. Assessment and letter of approval faxed to [facility] Bariatric Surgery Program coordinator on 04/10/2015.   Preferred Learning Style:   No preference indicated   Learning Readiness:   Ready  Handouts given during visit include:  Pre-Op Goals Bariatric Surgery Protein Shakes   During the appointment today the following Pre-Op Goals were reviewed with the patient: Maintain or lose weight as instructed by your surgeon Make healthy food choices Begin to limit portion sizes Limited concentrated sugars and fried foods Keep fat/sugar in the single digits per serving on   food labels Practice CHEWING your food  (aim for 30 chews per bite or until applesauce consistency) Practice not drinking 15 minutes before, during, and 30 minutes after each meal/snack Avoid all carbonated beverages  Avoid/limit caffeinated beverages  Avoid all sugar-sweetened beverages Consume 3 meals per day; eat every 3-5 hours Make a list of non-food related activities Aim for 64-100 ounces of FLUID daily  Aim for at least 60-80 grams of PROTEIN daily Look for a liquid protein source that contain ?15 g protein and ?5 g carbohydrate  (ex: shakes, drinks, shots)  Patient-Centered Goals: Goals: come off of medications, stay healthy and run after grandchildren  10 level of confidence/10 level of importance scale 1-10  Demonstrated degree of understanding via:  Teach Back  Teaching Method Utilized:  Visual Auditory Hands on  Barriers to learning/adherence to lifestyle change: none  Patient to call the [facility] to enroll in Pre-Op and Post-Op Nutrition Education when surgery date is scheduled.

## 2015-06-06 ENCOUNTER — Other Ambulatory Visit: Payer: Self-pay | Admitting: General Surgery

## 2015-06-10 ENCOUNTER — Encounter: Payer: Medicare Other | Attending: General Surgery

## 2015-06-10 DIAGNOSIS — Z713 Dietary counseling and surveillance: Secondary | ICD-10-CM | POA: Insufficient documentation

## 2015-06-10 DIAGNOSIS — Z6841 Body Mass Index (BMI) 40.0 and over, adult: Secondary | ICD-10-CM | POA: Diagnosis not present

## 2015-06-10 DIAGNOSIS — E669 Obesity, unspecified: Secondary | ICD-10-CM | POA: Insufficient documentation

## 2015-06-10 NOTE — Progress Notes (Signed)
  Pre-Operative Nutrition Class:  Appt start time: 1173   End time:  1830.  Patient was seen on 06/10/2015 for Pre-Operative Bariatric Surgery Education at [facility].   Surgery date: 06/24/2015 Surgery type: RYGB Start weight at [facility]: 328.5 lbs on 04/10/15 Weight today: 325.1 lbs  TANITA  BODY COMP RESULTS  06/10/15   BMI (kg/m^2) N/A   Fat Mass (lbs)    Fat Free Mass (lbs)    Total Body Water (lbs)    Samples given per MNT protocol. Patient educated on appropriate usage: Premier protein shake (strawberry - qty 1) Lot #: [id number] Exp: 12/2015  Bariatric Advantage Calcium citrate chew (caramel - qty 1) Lot #: [id number] Exp: 02/2016  Celebrate Vitamins Multivitamin (black cherry - qty 1) Lot #: [id number] Exp: 11/2016  Unjury Protein Powder (vanilla - qty 1) Lot #: [id number] Exp: 12/2015  The following the learning objectives were met by the patient during this course:  Identify Pre-Op Dietary Goals and will begin 2 weeks pre-operatively  Identify appropriate sources of fluids and proteins   State protein recommendations and appropriate sources pre and post-operatively  Identify Post-Operative Dietary Goals and will follow for 2 weeks post-operatively  Identify appropriate multivitamin and calcium sources  Describe the need for physical activity post-operatively and will follow MD recommendations  State when to call healthcare provider regarding medication questions or post-operative complications  Handouts given during class include:  Pre-Op Bariatric Surgery Diet Handout  Protein Shake Handout  Post-Op Bariatric Surgery Nutrition Handout  BELT Program Information Flyer  Support Group Information Flyer  [facility] Bariatric Supplements Price List  Follow-Up Plan: Patient will follow-up at [facility] 2 weeks post operatively for diet advancement per MD.

## 2015-06-17 NOTE — Patient Instructions (Addendum)
YOUR PROCEDURE IS SCHEDULED ON :  06/24/15  REPORT TO [facility] MAIN ENTRANCE FOLLOW SIGNS TO EAST ELEVATOR - GO TO 3rd FLOOR CHECK IN AT 3 EAST NURSES STATION (SHORT STAY) AT:  5:15 AM  CALL THIS NUMBER IF YOU HAVE PROBLEMS THE MORNING OF SURGERY [phone]  REMEMBER:ONLY 1 PER PERSON MAY GO TO SHORT STAY WITH YOU TO GET READY THE MORNING OF YOUR SURGERY  DO NOT EAT FOOD OR DRINK LIQUIDS AFTER MIDNIGHT  TAKE THESE MEDICINES THE MORNING OF SURGERY: use INHALER / TAKE XANAX / CARDIZEM / NEXIUM / TAKE 1/2 DOSE OF LANTUS (42 UNITS) THE NIGHT BEFORE SURGERY   STOP PHENTERAMINE  YOU MAY NOT HAVE ANY METAL ON YOUR BODY INCLUDING HAIR PINS AND PIERCING'S. DO NOT WEAR JEWELRY, MAKEUP, LOTIONS, POWDERS OR PERFUMES. DO NOT WEAR NAIL POLISH. DO NOT SHAVE 48 HRS PRIOR TO SURGERY. MEN MAY SHAVE FACE AND NECK.  DO NOT [facility]. [facility] IS NOT RESPONSIBLE FOR VALUABLES.  CONTACTS, DENTURES OR PARTIALS MAY NOT BE WORN TO SURGERY. LEAVE SUITCASE IN CAR. CAN BE BROUGHT TO ROOM AFTER SURGERY.  PATIENTS DISCHARGED THE DAY OF SURGERY WILL NOT BE ALLOWED TO DRIVE HOME.  PLEASE READ OVER THE FOLLOWING INSTRUCTION SHEETS _________________________________________________________________________________                                          [place] - PREPARING FOR SURGERY  Before surgery, you can play an important role.  Because skin is not sterile, your skin needs to be as free of germs as possible.  You can reduce the number of germs on your skin by washing with CHG (chlorahexidine gluconate) soap before surgery.  CHG is an antiseptic cleaner which kills germs and bonds with the skin to continue killing germs even after washing. Please DO NOT use if you have an allergy to CHG or antibacterial soaps.  If your skin becomes reddened/irritated stop using the CHG and inform your nurse when you arrive at Short Stay. Do not shave (including legs and underarms)  for at least 48 hours prior to the first CHG shower.  You may shave your face. Please follow these instructions carefully:   1.  Shower with CHG Soap the night before surgery and the  morning of Surgery.   2.  If you choose to wash your hair, wash your hair first as usual with your  normal  Shampoo.   3.  After you shampoo, rinse your hair and body thoroughly to remove the  shampoo.                                         4.  Use CHG as you would any other liquid soap.  You can apply chg directly  to the skin and wash . Gently wash with scrungie or clean wascloth    5.  Apply the CHG Soap to your body ONLY FROM THE NECK DOWN.   Do not use on open                           Wound or open sores. Avoid contact with eyes, ears mouth and genitals (private parts).  Genitals (private parts) with your normal soap.              6.  Wash thoroughly, paying special attention to the area where your surgery  will be performed.   7.  Thoroughly rinse your body with warm water from the neck down.   8.  DO NOT shower/wash with your normal soap after using and rinsing off  the CHG Soap .                9.  Pat yourself dry with a clean towel.             10.  Wear clean night clothes to bed after shower             11.  Place clean sheets on your bed the night of your first shower and do not  sleep with pets.  Day of Surgery : Do not apply any lotions/deodorants the morning of surgery.  Please wear clean clothes to the hospital/surgery center.  FAILURE TO FOLLOW THESE INSTRUCTIONS MAY RESULT IN THE CANCELLATION OF YOUR SURGERY    PATIENT SIGNATURE_________________________________  ______________________________________________________________________

## 2015-06-18 ENCOUNTER — Encounter (HOSPITAL_COMMUNITY): Payer: Self-pay

## 2015-06-18 ENCOUNTER — Encounter (HOSPITAL_COMMUNITY)
Admission: RE | Admit: 2015-06-18 | Discharge: 2015-06-18 | Disposition: A | Discharge status: Home / Self Care | Payer: Medicare Other | Source: Ambulatory Visit | Attending: General Surgery | Admitting: General Surgery

## 2015-06-18 ENCOUNTER — Encounter (INDEPENDENT_AMBULATORY_CARE_PROVIDER_SITE_OTHER): Payer: Self-pay

## 2015-06-18 DIAGNOSIS — Z79899 Other long term (current) drug therapy: Secondary | ICD-10-CM | POA: Diagnosis not present

## 2015-06-18 DIAGNOSIS — Z6841 Body Mass Index (BMI) 40.0 and over, adult: Secondary | ICD-10-CM | POA: Insufficient documentation

## 2015-06-18 DIAGNOSIS — Z01812 Encounter for preprocedural laboratory examination: Secondary | ICD-10-CM | POA: Insufficient documentation

## 2015-06-18 HISTORY — DX: Gastro-esophageal reflux disease without esophagitis: K21.9

## 2015-06-18 LAB — COMPREHENSIVE METABOLIC PANEL
ALK PHOS: 42 U/L (ref 38–126)
ALT: 18 U/L (ref 14–54)
ANION GAP: 8 (ref 5–15)
AST: 16 U/L (ref 15–41)
Albumin: 3.6 g/dL (ref 3.5–5.0)
BILIRUBIN TOTAL: 0.6 mg/dL (ref 0.3–1.2)
BUN: 17 mg/dL (ref 6–20)
CALCIUM: 8.7 mg/dL — AB (ref 8.9–10.3)
CO2: 24 mmol/L (ref 22–32)
Chloride: 105 mmol/L (ref 101–111)
Creatinine, Ser: 0.69 mg/dL (ref 0.44–1.00)
Glucose, Bld: 233 mg/dL — ABNORMAL HIGH (ref 65–99)
POTASSIUM: 4.2 mmol/L (ref 3.5–5.1)
Sodium: 137 mmol/L (ref 135–145)
TOTAL PROTEIN: 6.7 g/dL (ref 6.5–8.1)

## 2015-06-18 LAB — CBC WITH DIFFERENTIAL/PLATELET
BASOS PCT: 0 %
Basophils Absolute: 0 10*3/uL (ref 0.0–0.1)
Eosinophils Absolute: 0.1 10*3/uL (ref 0.0–0.7)
Eosinophils Relative: 2 %
HEMATOCRIT: 39.4 % (ref 36.0–46.0)
HEMOGLOBIN: 12.2 g/dL (ref 12.0–15.0)
LYMPHS ABS: 2.1 10*3/uL (ref 0.7–4.0)
Lymphocytes Relative: 36 %
MCH: 27.9 pg (ref 26.0–34.0)
MCHC: 31 g/dL (ref 30.0–36.0)
MCV: 90.2 fL (ref 78.0–100.0)
MONO ABS: 0.4 10*3/uL (ref 0.1–1.0)
MONOS PCT: 6 %
NEUTROS ABS: 3.3 10*3/uL (ref 1.7–7.7)
NEUTROS PCT: 56 %
Platelets: 186 10*3/uL (ref 150–400)
RBC: 4.37 MIL/uL (ref 3.87–5.11)
RDW: 13.4 % (ref 11.5–15.5)
WBC: 5.9 10*3/uL (ref 4.0–10.5)

## 2015-06-18 NOTE — Progress Notes (Signed)
Pt states she is taking Phentermine randomly and had taken one yesterday 06/17/15 - pt instructed to not to take any more before surgery. Also discussed with Dr.Carignan.

## 2015-06-19 LAB — HEMOGLOBIN A1C
Hgb A1c MFr Bld: 8.9 % — ABNORMAL HIGH (ref 4.8–5.6)
Mean Plasma Glucose: 209 mg/dL

## 2015-06-19 NOTE — Progress Notes (Signed)
Abnormal HgA1c faxed to Dr.Hoxworth

## 2015-06-24 ENCOUNTER — Inpatient Hospital Stay (HOSPITAL_COMMUNITY)
Admission: RE | Admit: 2015-06-24 | Discharge: 2015-06-26 | DRG: 621 | Disposition: A | Discharge status: Home / Self Care | Payer: Medicare Other | Source: Ambulatory Visit | Attending: General Surgery | Admitting: General Surgery

## 2015-06-24 ENCOUNTER — Inpatient Hospital Stay (HOSPITAL_COMMUNITY): Payer: Medicare Other | Admitting: Certified Registered Nurse Anesthetist

## 2015-06-24 ENCOUNTER — Encounter (HOSPITAL_COMMUNITY): Payer: Self-pay | Admitting: *Deleted

## 2015-06-24 ENCOUNTER — Encounter (HOSPITAL_COMMUNITY)
Admission: RE | Disposition: A | Discharge status: Home / Self Care | Payer: Self-pay | Source: Ambulatory Visit | Attending: General Surgery

## 2015-06-24 DIAGNOSIS — M1990 Unspecified osteoarthritis, unspecified site: Secondary | ICD-10-CM | POA: Diagnosis present

## 2015-06-24 DIAGNOSIS — F419 Anxiety disorder, unspecified: Secondary | ICD-10-CM | POA: Diagnosis present

## 2015-06-24 DIAGNOSIS — F329 Major depressive disorder, single episode, unspecified: Secondary | ICD-10-CM | POA: Diagnosis present

## 2015-06-24 DIAGNOSIS — K219 Gastro-esophageal reflux disease without esophagitis: Secondary | ICD-10-CM | POA: Diagnosis present

## 2015-06-24 DIAGNOSIS — E119 Type 2 diabetes mellitus without complications: Secondary | ICD-10-CM | POA: Diagnosis present

## 2015-06-24 DIAGNOSIS — Z7982 Long term (current) use of aspirin: Secondary | ICD-10-CM

## 2015-06-24 DIAGNOSIS — Z79899 Other long term (current) drug therapy: Secondary | ICD-10-CM | POA: Diagnosis not present

## 2015-06-24 DIAGNOSIS — E7800 Pure hypercholesterolemia, unspecified: Secondary | ICD-10-CM | POA: Diagnosis present

## 2015-06-24 DIAGNOSIS — Z8249 Family history of ischemic heart disease and other diseases of the circulatory system: Secondary | ICD-10-CM | POA: Diagnosis not present

## 2015-06-24 DIAGNOSIS — Z9889 Other specified postprocedural states: Secondary | ICD-10-CM

## 2015-06-24 DIAGNOSIS — G4733 Obstructive sleep apnea (adult) (pediatric): Secondary | ICD-10-CM | POA: Diagnosis present

## 2015-06-24 DIAGNOSIS — Z01812 Encounter for preprocedural laboratory examination: Secondary | ICD-10-CM

## 2015-06-24 DIAGNOSIS — Z6841 Body Mass Index (BMI) 40.0 and over, adult: Secondary | ICD-10-CM | POA: Diagnosis not present

## 2015-06-24 HISTORY — PX: GASTRIC ROUX-EN-Y: SHX5262

## 2015-06-24 LAB — GLUCOSE, CAPILLARY
GLUCOSE-CAPILLARY: 102 mg/dL — AB (ref 65–99)
GLUCOSE-CAPILLARY: 316 mg/dL — AB (ref 65–99)
GLUCOSE-CAPILLARY: 210 mg/dL — AB (ref 65–99)
Glucose-Capillary: 282 mg/dL — ABNORMAL HIGH (ref 65–99)
Glucose-Capillary: 218 mg/dL — ABNORMAL HIGH (ref 65–99)
Glucose-Capillary: 217 mg/dL — ABNORMAL HIGH (ref 65–99)

## 2015-06-24 LAB — MRSA PCR SCREENING: MRSA by PCR: NEGATIVE

## 2015-06-24 LAB — HEMOGLOBIN AND HEMATOCRIT, BLOOD
HEMATOCRIT: 42.1 % (ref 36.0–46.0)
HEMOGLOBIN: 13.9 g/dL (ref 12.0–15.0)

## 2015-06-24 SURGERY — LAPAROSCOPIC ROUX-EN-Y GASTRIC BYPASS WITH UPPER ENDOSCOPY
Anesthesia: General | Site: Abdomen

## 2015-06-24 MED ORDER — MIDAZOLAM HCL 2 MG/2ML IJ SOLN
INTRAMUSCULAR | Status: AC
  Filled 2015-06-24: qty 2

## 2015-06-24 MED ORDER — BUPIVACAINE-EPINEPHRINE 0.25% -1:200000 IJ SOLN
INTRAMUSCULAR | Status: DC | PRN
  Administered 2015-06-24: 30 mL

## 2015-06-24 MED ORDER — 0.9 % SODIUM CHLORIDE (POUR BTL) OPTIME
TOPICAL | Status: DC | PRN
  Administered 2015-06-24: 1000 mL

## 2015-06-24 MED ORDER — SUGAMMADEX SODIUM 500 MG/5ML IV SOLN
INTRAVENOUS | Status: AC
  Filled 2015-06-24: qty 5

## 2015-06-24 MED ORDER — FENTANYL CITRATE (PF) 100 MCG/2ML IJ SOLN
INTRAMUSCULAR | Status: DC | PRN
  Administered 2015-06-24 (×4): 50 ug via INTRAVENOUS

## 2015-06-24 MED ORDER — LACTATED RINGERS IR SOLN
Status: DC | PRN
  Administered 2015-06-24: 1000 mL

## 2015-06-24 MED ORDER — ROCURONIUM BROMIDE 100 MG/10ML IV SOLN
INTRAVENOUS | Status: AC
  Filled 2015-06-24: qty 1

## 2015-06-24 MED ORDER — SODIUM CHLORIDE 0.9 % IJ SOLN
INTRAMUSCULAR | Status: AC
  Filled 2015-06-24: qty 10

## 2015-06-24 MED ORDER — OXYCODONE HCL 5 MG/5ML PO SOLN
5.0000 mg | ORAL | Status: DC | PRN
  Administered 2015-06-26 (×2): 10 mg via ORAL
  Filled 2015-06-24 (×2): qty 10

## 2015-06-24 MED ORDER — ROCURONIUM BROMIDE 100 MG/10ML IV SOLN
INTRAVENOUS | Status: DC | PRN
  Administered 2015-06-24 (×4): 10 mg via INTRAVENOUS
  Administered 2015-06-24: 40 mg via INTRAVENOUS
  Administered 2015-06-24: 10 mg via INTRAVENOUS

## 2015-06-24 MED ORDER — BUPIVACAINE-EPINEPHRINE (PF) 0.25% -1:200000 IJ SOLN
INTRAMUSCULAR | Status: AC
  Filled 2015-06-24: qty 30

## 2015-06-24 MED ORDER — PROPOFOL 10 MG/ML IV BOLUS
INTRAVENOUS | Status: AC
  Filled 2015-06-24: qty 20

## 2015-06-24 MED ORDER — ENOXAPARIN SODIUM 30 MG/0.3ML ~~LOC~~ SOLN
30.0000 mg | Freq: Two times a day (BID) | SUBCUTANEOUS | Status: DC
  Administered 2015-06-24 – 2015-06-26 (×4): 30 mg via SUBCUTANEOUS
  Filled 2015-06-24 (×5): qty 0.3

## 2015-06-24 MED ORDER — PANTOPRAZOLE SODIUM 40 MG IV SOLR
40.0000 mg | Freq: Every day | INTRAVENOUS | Status: DC
  Administered 2015-06-24 – 2015-06-25 (×2): 40 mg via INTRAVENOUS
  Filled 2015-06-24 (×3): qty 40

## 2015-06-24 MED ORDER — ACETAMINOPHEN 160 MG/5ML PO SOLN: 325.0000 mg | ORAL | Status: DC | PRN

## 2015-06-24 MED ORDER — DEXMEDETOMIDINE HCL IN NACL 200 MCG/50ML IV SOLN
0.4000 ug/kg/h | INTRAVENOUS | Status: DC
  Administered 2015-06-24: 0.7 ug/kg/h via INTRAVENOUS
  Filled 2015-06-24 (×2): qty 50

## 2015-06-24 MED ORDER — LACTATED RINGERS IV SOLN
INTRAVENOUS | Status: DC
  Administered 2015-06-24: 14:00:00 via INTRAVENOUS

## 2015-06-24 MED ORDER — PROPOFOL 10 MG/ML IV BOLUS
INTRAVENOUS | Status: DC | PRN
Start: 2015-06-24 — End: 2015-06-24
  Administered 2015-06-24: 300 mg via INTRAVENOUS

## 2015-06-24 MED ORDER — LACTATED RINGERS IV SOLN
INTRAVENOUS | Status: DC | PRN
  Administered 2015-06-24 (×2): via INTRAVENOUS

## 2015-06-24 MED ORDER — POTASSIUM CHLORIDE IN NACL 20-0.9 MEQ/L-% IV SOLN
INTRAVENOUS | Status: DC
  Administered 2015-06-24 – 2015-06-26 (×5): via INTRAVENOUS
  Filled 2015-06-24 (×6): qty 1000

## 2015-06-24 MED ORDER — DEXTROSE 5 % IV SOLN
INTRAVENOUS | Status: AC
  Filled 2015-06-24 (×2): qty 2

## 2015-06-24 MED ORDER — INSULIN ASPART 100 UNIT/ML ~~LOC~~ SOLN: 8.0000 [IU] | Freq: Once | SUBCUTANEOUS | Status: DC

## 2015-06-24 MED ORDER — DEXAMETHASONE SODIUM PHOSPHATE 10 MG/ML IJ SOLN
INTRAMUSCULAR | Status: DC | PRN
  Administered 2015-06-24: 10 mg via INTRAVENOUS

## 2015-06-24 MED ORDER — EVICEL 5 ML EX KIT
PACK | Freq: Once | CUTANEOUS | Status: AC
Start: 2015-06-24 — End: 2015-06-24
  Administered 2015-06-24: 5 mL
  Filled 2015-06-24: qty 1

## 2015-06-24 MED ORDER — ONDANSETRON HCL 4 MG/2ML IJ SOLN
INTRAMUSCULAR | Status: AC
  Filled 2015-06-24: qty 2

## 2015-06-24 MED ORDER — BUPIVACAINE LIPOSOME 1.3 % IJ SUSP
20.0000 mL | Freq: Once | INTRAMUSCULAR | Status: AC
  Administered 2015-06-24: 20 mL
  Filled 2015-06-24: qty 20

## 2015-06-24 MED ORDER — INSULIN ASPART 100 UNIT/ML ~~LOC~~ SOLN
8.0000 [IU] | Freq: Once | SUBCUTANEOUS | Status: AC
  Administered 2015-06-24: 8 [IU] via INTRAVENOUS

## 2015-06-24 MED ORDER — INSULIN ASPART 100 UNIT/ML ~~LOC~~ SOLN
SUBCUTANEOUS | Status: AC
  Filled 2015-06-24: qty 1

## 2015-06-24 MED ORDER — ACETAMINOPHEN 10 MG/ML IV SOLN
1000.0000 mg | Freq: Four times a day (QID) | INTRAVENOUS | Status: AC
  Administered 2015-06-24 – 2015-06-25 (×4): 1000 mg via INTRAVENOUS
  Filled 2015-06-24 (×5): qty 100

## 2015-06-24 MED ORDER — HEPARIN SODIUM (PORCINE) 5000 UNIT/ML IJ SOLN
5000.0000 [IU] | INTRAMUSCULAR | Status: AC
  Administered 2015-06-24: 5000 [IU] via SUBCUTANEOUS
  Filled 2015-06-24: qty 1

## 2015-06-24 MED ORDER — LIDOCAINE HCL (CARDIAC) 20 MG/ML IV SOLN
INTRAVENOUS | Status: AC
  Filled 2015-06-24: qty 5

## 2015-06-24 MED ORDER — SUGAMMADEX SODIUM 200 MG/2ML IV SOLN
INTRAVENOUS | Status: DC | PRN
  Administered 2015-06-24: 500 mg via INTRAVENOUS

## 2015-06-24 MED ORDER — ACETAMINOPHEN 10 MG/ML IV SOLN
INTRAVENOUS | Status: AC
  Filled 2015-06-24: qty 100

## 2015-06-24 MED ORDER — SUCCINYLCHOLINE CHLORIDE 20 MG/ML IJ SOLN
INTRAMUSCULAR | Status: DC | PRN
  Administered 2015-06-24: 140 mg via INTRAVENOUS

## 2015-06-24 MED ORDER — DEXMEDETOMIDINE HCL IN NACL 200 MCG/50ML IV SOLN
0.4000 ug/kg/h | INTRAVENOUS | Status: DC
  Filled 2015-06-24 (×2): qty 50

## 2015-06-24 MED ORDER — ARTIFICIAL TEARS OP OINT
TOPICAL_OINTMENT | OPHTHALMIC | Status: AC
  Filled 2015-06-24: qty 3.5

## 2015-06-24 MED ORDER — PREMIER PROTEIN SHAKE
2.0000 [oz_av] | Freq: Four times a day (QID) | ORAL | Status: DC
  Administered 2015-06-26 (×3): 2 [oz_av] via ORAL
  Filled 2015-06-24: qty 325.31

## 2015-06-24 MED ORDER — DEXTROSE 5 % IV SOLN
2.0000 g | INTRAVENOUS | Status: AC
  Administered 2015-06-24 (×2): 2 g via INTRAVENOUS

## 2015-06-24 MED ORDER — ACETAMINOPHEN 10 MG/ML IV SOLN
INTRAVENOUS | Status: DC | PRN
  Administered 2015-06-24: 1000 mg via INTRAVENOUS

## 2015-06-24 MED ORDER — FENTANYL CITRATE (PF) 250 MCG/5ML IJ SOLN
INTRAMUSCULAR | Status: AC
  Filled 2015-06-24: qty 5

## 2015-06-24 MED ORDER — INSULIN ASPART 100 UNIT/ML ~~LOC~~ SOLN
0.0000 [IU] | SUBCUTANEOUS | Status: DC
  Administered 2015-06-24 (×2): 5 [IU] via SUBCUTANEOUS
  Administered 2015-06-25 (×6): 3 [IU] via SUBCUTANEOUS
  Administered 2015-06-26 (×2): 2 [IU] via SUBCUTANEOUS
  Administered 2015-06-26: 3 [IU] via SUBCUTANEOUS

## 2015-06-24 MED ORDER — ALBUTEROL SULFATE HFA 108 (90 BASE) MCG/ACT IN AERS
INHALATION_SPRAY | RESPIRATORY_TRACT | Status: DC | PRN
  Administered 2015-06-24: 5 via RESPIRATORY_TRACT

## 2015-06-24 MED ORDER — FENTANYL CITRATE (PF) 100 MCG/2ML IJ SOLN: 25.0000 ug | INTRAMUSCULAR | Status: DC | PRN

## 2015-06-24 MED ORDER — ONDANSETRON HCL 4 MG/2ML IJ SOLN: 4.0000 mg | Freq: Once | INTRAMUSCULAR | Status: DC | PRN

## 2015-06-24 MED ORDER — ALBUTEROL SULFATE (2.5 MG/3ML) 0.083% IN NEBU: 2.5000 mg | INHALATION_SOLUTION | Freq: Four times a day (QID) | RESPIRATORY_TRACT | Status: DC | PRN

## 2015-06-24 MED ORDER — LIDOCAINE HCL (CARDIAC) 20 MG/ML IV SOLN
INTRAVENOUS | Status: DC | PRN
  Administered 2015-06-24: 100 mg via INTRAVENOUS

## 2015-06-24 MED ORDER — EPHEDRINE SULFATE 50 MG/ML IJ SOLN
INTRAMUSCULAR | Status: AC
  Filled 2015-06-24: qty 1

## 2015-06-24 MED ORDER — SODIUM CHLORIDE 0.9 % IJ SOLN
INTRAMUSCULAR | Status: DC | PRN
  Administered 2015-06-24: 60 mL

## 2015-06-24 MED ORDER — ONDANSETRON HCL 4 MG/2ML IJ SOLN: 4.0000 mg | INTRAMUSCULAR | Status: DC | PRN

## 2015-06-24 MED ORDER — MIDAZOLAM HCL 5 MG/5ML IJ SOLN
INTRAMUSCULAR | Status: DC | PRN
  Administered 2015-06-24: 2 mg via INTRAVENOUS

## 2015-06-24 MED ORDER — ALBUTEROL SULFATE HFA 108 (90 BASE) MCG/ACT IN AERS
INHALATION_SPRAY | RESPIRATORY_TRACT | Status: AC
  Filled 2015-06-24: qty 6.7

## 2015-06-24 MED ORDER — DEXAMETHASONE SODIUM PHOSPHATE 10 MG/ML IJ SOLN
INTRAMUSCULAR | Status: AC
  Filled 2015-06-24: qty 1

## 2015-06-24 MED ORDER — ACETAMINOPHEN 160 MG/5ML PO SOLN
650.0000 mg | ORAL | Status: DC | PRN
  Filled 2015-06-24: qty 20.3

## 2015-06-24 MED ORDER — SODIUM CHLORIDE 0.9 % IJ SOLN
INTRAMUSCULAR | Status: AC
  Filled 2015-06-24: qty 100

## 2015-06-24 MED ORDER — MORPHINE SULFATE (PF) 2 MG/ML IV SOLN
2.0000 mg | INTRAVENOUS | Status: DC | PRN
  Administered 2015-06-24: 4 mg via INTRAVENOUS
  Administered 2015-06-24 – 2015-06-25 (×2): 2 mg via INTRAVENOUS
  Administered 2015-06-25: 4 mg via INTRAVENOUS
  Administered 2015-06-25: 6 mg via INTRAVENOUS
  Administered 2015-06-25: 2 mg via INTRAVENOUS
  Administered 2015-06-25: 4 mg via INTRAVENOUS
  Filled 2015-06-24: qty 2
  Filled 2015-06-24: qty 1
  Filled 2015-06-24: qty 3
  Filled 2015-06-24: qty 1
  Filled 2015-06-24: qty 2
  Filled 2015-06-24: qty 1
  Filled 2015-06-24: qty 2

## 2015-06-24 MED ORDER — ONDANSETRON HCL 4 MG/2ML IJ SOLN
INTRAMUSCULAR | Status: DC | PRN
  Administered 2015-06-24: 4 mg via INTRAVENOUS

## 2015-06-24 SURGICAL SUPPLY — 92 items
APL SRG 32X5 SNPLK LF DISP (MISCELLANEOUS) ×1
APPLICATOR COTTON TIP 6IN STRL (MISCELLANEOUS) ×6 IMPLANT
APPLIER CLIP ROT 10 11.4 M/L (STAPLE)
APPLIER CLIP ROT 13.4 12 LRG (CLIP)
APR CLP LRG 13.4X12 ROT 20 MLT (CLIP)
APR CLP MED LRG 11.4X10 (STAPLE)
BAG SPEC RTRVL LRG 6X4 10 (ENDOMECHANICALS)
BLADE SURG SZ11 CARB STEEL (BLADE) ×3 IMPLANT
CABLE HIGH FREQUENCY MONO STRZ (ELECTRODE) ×3 IMPLANT
CHLORAPREP W/TINT 26ML (MISCELLANEOUS) ×5 IMPLANT
CLIP APPLIE ROT 10 11.4 M/L (STAPLE) IMPLANT
CLIP APPLIE ROT 13.4 12 LRG (CLIP) IMPLANT
CLIP SUT LAPRA TY ABSORB (SUTURE) ×6 IMPLANT
COVER SURGICAL LIGHT HANDLE (MISCELLANEOUS) IMPLANT
CUTTER FLEX LINEAR 45M (STAPLE) ×3 IMPLANT
DECANTER SPIKE VIAL GLASS SM (MISCELLANEOUS) ×3 IMPLANT
DEVICE SUT QUICK LOAD TK 5 (STAPLE) IMPLANT
DEVICE SUT TI-KNOT TK 5X26 (MISCELLANEOUS) IMPLANT
DEVICE SUTURE ENDOST 10MM (ENDOMECHANICALS) IMPLANT
DEVICE TI KNOT TK5 (MISCELLANEOUS)
DISSECTOR BLUNT TIP ENDO 5MM (MISCELLANEOUS) IMPLANT
DRAIN PENROSE 18X1/4 LTX STRL (WOUND CARE) ×3 IMPLANT
ELECT REM PT RETURN 9FT ADLT (ELECTROSURGICAL) ×3
ELECTRODE REM PT RTRN 9FT ADLT (ELECTROSURGICAL) ×1 IMPLANT
GAUZE SPONGE 4X4 12PLY STRL (GAUZE/BANDAGES/DRESSINGS) ×3 IMPLANT
GAUZE SPONGE 4X4 16PLY XRAY LF (GAUZE/BANDAGES/DRESSINGS) ×3 IMPLANT
GLOVE BIOGEL PI IND STRL 7.5 (GLOVE) ×1 IMPLANT
GLOVE BIOGEL PI INDICATOR 7.5 (GLOVE) ×2
GLOVE ECLIPSE 7.5 STRL STRAW (GLOVE) ×3 IMPLANT
GOWN STRL REUS W/TWL XL LVL3 (GOWN DISPOSABLE) ×10 IMPLANT
HEMOSTAT SURGICEL 4X8 (HEMOSTASIS) IMPLANT
HOVERMATT SINGLE USE (MISCELLANEOUS) ×3 IMPLANT
KIT BASIN OR (CUSTOM PROCEDURE TRAY) ×3 IMPLANT
KIT GASTRIC LAVAGE 34FR ADT (SET/KITS/TRAYS/PACK) ×3 IMPLANT
LIQUID BAND (GAUZE/BANDAGES/DRESSINGS) ×3 IMPLANT
LUBRICANT JELLY K Y 4OZ (MISCELLANEOUS) ×2 IMPLANT
MARKER SKIN DUAL TIP RULER LAB (MISCELLANEOUS) ×3 IMPLANT
NDL SPNL 22GX3.5 QUINCKE BK (NEEDLE) ×1 IMPLANT
NEEDLE SPNL 22GX3.5 QUINCKE BK (NEEDLE) ×3 IMPLANT
PACK CARDIOVASCULAR III (CUSTOM PROCEDURE TRAY) ×3 IMPLANT
POUCH SPECIMEN RETRIEVAL 10MM (ENDOMECHANICALS) IMPLANT
QUICK LOAD TK 5 (STAPLE)
RELOAD 45 VASCULAR/THIN (ENDOMECHANICALS) ×3 IMPLANT
RELOAD ENDO STITCH 2.0 (ENDOMECHANICALS)
RELOAD STAPLE 45 2.5 WHT GRN (ENDOMECHANICALS) ×1 IMPLANT
RELOAD STAPLE 45 3.5 BLU ETS (ENDOMECHANICALS) ×1 IMPLANT
RELOAD STAPLE 60 2.6 WHT THN (STAPLE) ×1 IMPLANT
RELOAD STAPLE 60 3.6 BLU REG (STAPLE) ×2 IMPLANT
RELOAD STAPLE 60 3.8 GOLD REG (STAPLE) ×1 IMPLANT
RELOAD STAPLE TA45 3.5 REG BLU (ENDOMECHANICALS) ×3 IMPLANT
RELOAD STAPLER BLUE 60MM (STAPLE) ×4 IMPLANT
RELOAD STAPLER GOLD 60MM (STAPLE) ×1 IMPLANT
RELOAD STAPLER WHITE 60MM (STAPLE) ×1 IMPLANT
RELOAD SUT SNGL STCH ABSRB 2-0 (ENDOMECHANICALS) IMPLANT
RELOAD SUT SNGL STCH BLK 2-0 (ENDOMECHANICALS) IMPLANT
SCISSORS LAP 5X45 EPIX DISP (ENDOMECHANICALS) ×3 IMPLANT
SEALANT SURGICAL APPL DUAL CAN (MISCELLANEOUS) ×3 IMPLANT
SET IRRIG TUBING LAPAROSCOPIC (IRRIGATION / IRRIGATOR) ×3 IMPLANT
SHEARS HARMONIC ACE PLUS 45CM (MISCELLANEOUS) ×3 IMPLANT
SLEEVE ADV FIXATION 12X100MM (TROCAR) ×6 IMPLANT
SOLUTION ANTI FOG 6CC (MISCELLANEOUS) ×3 IMPLANT
STAPLER ECHELON LONG 60 440 (INSTRUMENTS) ×3 IMPLANT
STAPLER RELOAD BLUE 60MM (STAPLE) ×12
STAPLER RELOAD GOLD 60MM (STAPLE) ×3
STAPLER RELOAD WHITE 60MM (STAPLE) ×3
SUT DEVICE BRAIDED 0X39 (SUTURE) IMPLANT
SUT DVC SILK 2.0X39 (SUTURE) ×15 IMPLANT
SUT DVC VICRYL PGA 2.0X39 (SUTURE) ×15 IMPLANT
SUT MNCRL AB 4-0 PS2 18 (SUTURE) ×3 IMPLANT
SUT RELOAD ENDO STITCH 2 48X1 (ENDOMECHANICALS)
SUT RELOAD ENDO STITCH 2.0 (ENDOMECHANICALS)
SUT SURGIDAC NAB ES-9 0 48 120 (SUTURE) IMPLANT
SUT VIC AB 2-0 SH 27 (SUTURE)
SUT VIC AB 2-0 SH 27X BRD (SUTURE) IMPLANT
SUTURE RELOAD END STTCH 2 48X1 (ENDOMECHANICALS) IMPLANT
SUTURE RELOAD ENDO STITCH 2.0 (ENDOMECHANICALS) IMPLANT
SYR 10ML ECCENTRIC (SYRINGE) ×3 IMPLANT
SYR 20CC LL (SYRINGE) ×6 IMPLANT
SYR 50ML LL SCALE MARK (SYRINGE) ×3 IMPLANT
TIP RIGID 35CM EVICEL (HEMOSTASIS) ×3 IMPLANT
TOWEL OR 17X26 10 PK STRL BLUE (TOWEL DISPOSABLE) ×3 IMPLANT
TOWEL OR NON WOVEN STRL DISP B (DISPOSABLE) ×3 IMPLANT
TRAY FOLEY W/METER SILVER 14FR (SET/KITS/TRAYS/PACK) ×3 IMPLANT
TRAY FOLEY W/METER SILVER 16FR (SET/KITS/TRAYS/PACK) ×1 IMPLANT
TROCAR ADV FIXATION 12X100MM (TROCAR) ×3 IMPLANT
TROCAR ADV FIXATION 5X100MM (TROCAR) ×3 IMPLANT
TROCAR BLADELESS OPT 5 100 (ENDOMECHANICALS) ×3 IMPLANT
TROCAR XCEL 12X100 BLDLESS (ENDOMECHANICALS) ×3 IMPLANT
TUBING CONNECTING 10 (TUBING) IMPLANT
TUBING CONNECTING 10' (TUBING)
TUBING ENDO SMARTCAP PENTAX (MISCELLANEOUS) ×3 IMPLANT
TUBING INSUF HEATED (TUBING) ×3 IMPLANT

## 2015-06-24 NOTE — Progress Notes (Signed)
Utilization review completed.  

## 2015-06-24 NOTE — Interval H&P Note (Signed)
History and Physical Interval Note:  06/24/2015 7:15 AM  Lisa Todd  has presented today for surgery, with the diagnosis of morbid obesity  The various methods of treatment have been discussed with the patient and family. After consideration of risks, benefits and other options for treatment, the patient has consented to  Procedure(s): LAPAROSCOPIC ROUX-EN-Y GASTRIC BYPASS WITH UPPER ENDOSCOPY (N/A) as a surgical intervention .  The patient's history has been reviewed, patient examined, no change in status, stable for surgery.  I have reviewed the patient's chart and labs.  Questions were answered to the patient's satisfaction.     [name]

## 2015-06-24 NOTE — Anesthesia Procedure Notes (Signed)
Procedure Name: Intubation Date/Time: 06/24/2015 7:29 AM Performed by: Maxwell Caul Pre-anesthesia Checklist: Patient identified, Emergency Drugs available, Suction available and Patient being monitored Patient Re-evaluated:Patient Re-evaluated prior to inductionOxygen Delivery Method: Circle System Utilized Preoxygenation: Pre-oxygenation with 100% oxygen Intubation Type: IV induction Ventilation: Mask ventilation without difficulty Laryngoscope Size: Mac and 4 Grade View: Grade I Tube type: Oral Tube size: 7.5 mm Number of attempts: 1 Airway Equipment and Method: Stylet and Oral airway Placement Confirmation: ETT inserted through vocal cords under direct vision,  positive ETCO2 and breath sounds checked- equal and bilateral Secured at: 21 cm Tube secured with: Tape Dental Injury: Teeth and Oropharynx as per pre-operative assessment

## 2015-06-24 NOTE — H&P (Signed)
History of Present Illness Marland Kitchen T. Draven Natter MD; 06/06/2015 11:11 AM) Patient words: pre op bypass.  The patient is a 60 year old female who presents with obesity. She returns for her preoperative visit prior to planned laparoscopic Roux-en-Y gastric bypass. The patient gives a history of progressive obesity since early adulthood despite multiple attempts at medical management. This has included phentermine, programs at the [facility], dietary supervised weight loss and the bariatric clinic in [facility]. Through diet and exercise plans for medications she has been able to lose up to 40-50 pounds at a time but then experiences progressive weight regain. Obesity has been affecting the patient in a number of ways including great difficulty performing routine activities of daily living chiefly due to severe knee pain with bone-on-bone arthritis. She has some significant fatigue with activities.. Significant co-morbid illnesses have developed including type 2 diabetes mellitus, GERD, osteoarthritis, obstructive sleep apnea and mild asthma.  She has successfully completed her preoperative workup. No concerns on psychologic or nutrition evaluation. We reviewed her lab work which was unremarkable except for elevated lipids. H. pylori was negative. She is excited about proceeding with her surgery.   Problem List/Past Medical Ventura Sellers, [place]; 06/06/2015 10:28 AM) MORBID OBESITY WITH BMI OF 50.0-59.9, ADULT (E66.01)  Other Problems Ventura Sellers, CMA; 06/06/2015 10:28 AM) Anxiety Disorder Arthritis Asthma Back Pain Depression Gastroesophageal Reflux Disease High blood pressure Hypercholesterolemia Sleep Apnea  Past Surgical History Ventura Sellers, CMA; 06/06/2015 10:28 AM) Cesarean Section - 1 Hysterectomy (not due to cancer) - Partial Knee Surgery Bilateral. Shoulder Surgery Right.  Diagnostic Studies History Ventura Sellers, [place]; 06/06/2015  10:28 AM) Colonoscopy never Mammogram 1-3 years ago Pap Smear 1-5 years ago  Allergies Ventura Sellers, CMA; 06/06/2015 10:28 AM) Liraglutide *ANTIDIABETICS*  Medication History Ventura Sellers, CMA; 06/06/2015 10:28 AM) Oxycodone-Acetaminophen (5-325MG  Tablet, Oral) Active. Ibuprofen (600MG  Tablet, Oral) Active. Albuterol Sulfate (108 (90 Base)MCG/ACT Aero Pow Br Act, Inhalation) Active. Xanax (1MG  Tablet, Oral) Active. Aspirin (81MG  Tablet, Oral) Active. Voltaren (1% Gel, External) Active. Cardizem CD (240MG  Capsule ER 24HR, Oral) Active. NexIUM (40MG  Packet, Oral) Active. Norco (5-325MG  Tablet, Oral) Active. Advil (200MG  Tablet, Oral) Active. GlipiZIDE (5MG  Tablet, Oral) Active. NovoLOG Mix 70/30 ((70-30) 100UNIT/ML Suspension, Subcutaneous) Active. Qvar (40MCG/ACT Aerosol Soln, Inhalation) Active. Trilipix (135MG  Capsule DR, Oral) Active. Lantus for OptiClik (100UNIT/ML Soln Cartridge, Subcutaneous) Active. Pravastatin Sodium (80MG  Tablet, Oral) Active. Januvia (100MG  Tablet, Oral) Active. Phentermine HCl (37.5MG  Capsule, Oral) Active. ProAir HFA (108 (90 Base)MCG/ACT Aerosol Soln, Inhalation) Active. Medications Reconciled  Social History Ventura Sellers, [place]; 06/06/2015 10:28 AM) Alcohol use Occasional alcohol use. Caffeine use Carbonated beverages. No drug use Tobacco use Never smoker.  Family History Ventura Sellers, [place]; 06/06/2015 10:28 AM) Arthritis Mother. Breast Cancer Mother. Diabetes Mellitus Father. Hypertension Father, Mother. Ischemic Bowel Disease Mother.  Pregnancy / Birth History Ventura Sellers, [place]; 06/06/2015 10:28 AM) Age at menarche 41 years. Age of menopause 29-55 Gravida 4 Irregular periods Maternal age 41-20 Para 3  Vitals Sharyn Lull R. Brooks CMA; 06/06/2015 10:28 AM) 06/06/2015 10:27 AM Weight: 324 lb Height: 62.5in Body Surface Area: 2.36 m Body Mass Index: 58.32 kg/m  Temp.:  98.22F(Oral)  BP: 128/82 (Sitting, Left Arm, Standard)       Physical Exam Marland Kitchen T. Angell Pincock MD; 06/06/2015 11:12 AM) The physical exam findings are as follows: Note:General: Alert, morbidly obese Afro-American female, in no distress Skin: Warm and dry without rash or infection. HEENT: No palpable masses or thyromegaly.  Sclera nonicteric. Pupils equal round and reactive. Lymph nodes: No cervical, supraclavicular, nodes palpable. Lungs: Breath sounds clear and equal. No wheezing or increased work of breathing. Cardiovascular: Regular rate and rhythm without murmer. No JVD or edema. Peripheral pulses intact. No carotid bruits. Abdomen: Very obese but much of her weight in her lower abdomen and thighs. Nondistended. Soft and nontender. No masses palpable. No organomegaly. No palpable hernias. Extremities: No edema or joint swelling or deformity. No chronic venous stasis changes. Neurologic: Alert and fully oriented. Gait normal. No focal weakness. Psychiatric: Normal mood and affect. Thought content appropriate with normal judgement and insight    Assessment & Plan Marland Kitchen T. Rachelann Enloe MD; 06/06/2015 11:13 AM) MORBID OBESITY WITH BMI OF 50.0-59.9, ADULT (E66.01) Impression: Patient with progressive morbid obesity unresponsive to multiple efforts at medical management who presents with a BMI of 59 and comorbidities of insulin-dependent diabetes mellitus, obstructive sleep apnea, severe osteoarthritis and chronic joint pain, GERD and asthma. I believe there would be very significant medical benefit from surgical weight loss. She has successfully completed her preoperative workup for planned laparoscopic Roux-en-Y gastric bypass. We reviewed the procedure and the consent form again today and all her questions were answered. She is given a prescription for pain medication. She will start her preoperative diet next week.

## 2015-06-24 NOTE — Transfer of Care (Signed)
Immediate Anesthesia Transfer of Care Note  Patient: Lisa Todd  Procedure(s) Performed: Procedure(s): LAPAROSCOPIC ROUX-EN-Y GASTRIC BYPASS WITH UPPER ENDOSCOPY (N/A)  Patient Location: PACU  Anesthesia Type:General  Level of Consciousness:  sedated, patient cooperative and responds to stimulation  Airway & Oxygen Therapy:Patient Spontanous Breathing and Patient connected to face mask oxgen  Post-op Assessment:  Report given to PACU RN and Post -op Vital signs reviewed and stable  Post vital signs:  Reviewed and stable  Last Vitals:  Filed Vitals:   06/24/15 0515  BP: 137/76  Pulse: 80  Temp: 36.3 C  Resp: 18    Complications: No apparent anesthesia complications

## 2015-06-24 NOTE — Op Note (Signed)
Preop diagnosis: Morbid obesity  Postop diagnosis: Morbid obesity  Body mass index is 56.06 kg/(m^2).  Surgical procedure: Laparoscopic Roux-en-Y gastric bypass  Surgeon: Marland Kitchen T.Shae Augello M.D.  Asst.: Gurney Maxin M.D.  Anesthesia: General  Complications:  None  EBL: Minimal  Drains: None  Disposition: PACU in good condition  Description of procedure: Patient is brought to the operating room and general anesthesia induced. She had received preoperative broad-spectrum IV antibiotics and subcutaneous heparin. The abdomen was widely sterilely prepped and draped. Patient timeout was performed and correct patient and procedure confirmed. Access was obtained with a 12 mm Optiview trocar in the left upper quadrant and pneumoperitoneum established without difficulty. Under direct vision 12 mm trocars were placed laterally in the right upper quadrant, right upper quadrant midclavicular line, and to the left and above the umbilicus for the camera port. A 5 mm trocar was placed laterally in the left upper quadrant. The omentum was brought into the upper abdomen and the transverse mesocolon elevated and the ligament of Treitz clearly identified. A 40 cm biliopancreatic limb was then carefully measured from the ligament of Treitz. The small intestine was divided at this point with a single firing of the white load linear stapler. A Penrose drain was sutured to the end of the Roux-en-Y limb for later identification. A 100 cm Roux-en-Y limb was then carefully measured. At this point a side-to-side anastomosis was created between the Roux limb and the end of the biliopancreatic limb. This was accomplished with a single firing of the 45 mm white load linear stapler. The common enterotomy was closed with a running 2-0 Vicryl begun at either end of the enterotomy and tied centrally. The mesenteric defect was then closed with running 2-0 silk. The omentum was then divided with the harmonic scalpel up towards  the transverse colon to allow mobility of the Roux limb toward the gastric pouch. The patient was then placed in steep reversed Trendelenburg. Through a 5 mm subxiphoid site the [facility] retractor was placed and the left lobe of the liver elevated with excellent exposure of the upper stomach and hiatus. The angle of Hiss was then mobilized with the harmonic scalpel. A 4 cm gastric pouch was then carefully measured along the lesser curve of the stomach. Dissection was carried along the lesser curve at this point with the Harmonic scalpel working carefully back toward the lesser sac at right angles to the lesser curve. The free lesser sac was then entered. After being sure all tubes were removed from the stomach an initial firing of the gold load 60 mm linear stapler was fired at right angles across the lesser curve for about 4 cm. The gastric pouch was further mobilized posteriorly and then the pouch was completed with 2 further firings of the 60 mm blue load linear stapler up through the previously dissected angle of His. It was ensured that the pouch was completely mobilized away from the gastric remnant. This created a nice tubular 4-5 cm gastric pouch. The staple line of the gastric remnant was then oversewn with 2-0 silk for hemostasis. The Roux limb was then brought up in an antecolic fashion with the candycane facing to the patient's left without undue tension. The gastrojejunostomy was created with an initial posterior row of 2-0 Vicryl between the Roux limb and the staple line of the gastric pouch. Enterotomies were then made in the gastric pouch and the Roux limb with the harmonic scalpel and at approximately 2-2-1/2 cm anastomosis was created with a single  firing of the blue load linear stapler. The staple line was inspected and was intact without bleeding. The common enterotomy was then closed with running 2-0 Vicryl begun at either end and tied centrally. The wall tube was then easily passed through the  anastomosis and an outer anterior layer of running 2-0 Vicryl was placed. The Ewald tube was removed. With the outlet of the gastrojejunostomy clamped and under saline irrigation the assistant performed upper endoscopy and with the gastric pouch tensely distended with air there was no evidence of leak. The pouch was desufflated. The Terance Hart defect was closed with running 2-0 silk. The abdomen was inspected for any evidence of bleeding or bowel injury and everything looked fine. The Nathanson retractor was removed under direct vision after coating the anastomosis with Tisseel tissue sealant. All CO2 was evacuated and trochars removed. Skin incisions were closed with staples. Sponge needle and instrument counts were correct. The patient was taken to the PACU in good condition.     Edward Jolly MD, FACS  06/24/2015, 10:48 AM

## 2015-06-24 NOTE — Anesthesia Preprocedure Evaluation (Addendum)
Anesthesia Evaluation  Patient identified by MRN, date of birth, ID band Patient awake    Reviewed: Allergy & Precautions, NPO status , Patient's Chart, lab work & pertinent test results  Airway Mallampati: I  TM Distance: >3 FB Neck ROM: Full    Dental  (+) Dental Advisory Given   Pulmonary asthma , sleep apnea and Continuous Positive Airway Pressure Ventilation ,    breath sounds clear to auscultation       Cardiovascular hypertension,  Rhythm:Regular Rate:Normal     Neuro/Psych Anxiety Depression negative neurological ROS     GI/Hepatic Neg liver ROS, GERD  ,  Endo/Other  diabetes, Type 2, Insulin DependentMorbid obesity  Renal/GU negative Renal ROS     Musculoskeletal  (+) Arthritis ,   Abdominal   Peds  Hematology negative hematology ROS (+)   Anesthesia Other Findings   Reproductive/Obstetrics                            Lab Results  Component Value Date   WBC 5.9 06/18/2015   HGB 12.2 06/18/2015   HCT 39.4 06/18/2015   MCV 90.2 06/18/2015   PLT 186 06/18/2015   Lab Results  Component Value Date   CREATININE 0.69 06/18/2015   BUN 17 06/18/2015   NA 137 06/18/2015   K 4.2 06/18/2015   CL 105 06/18/2015   CO2 24 06/18/2015    Anesthesia Physical Anesthesia Plan  ASA: III  Anesthesia Plan: General   Post-op Pain Management:    Induction: Intravenous  Airway Management Planned: Oral ETT  Additional Equipment:   Intra-op Plan:   Post-operative Plan: Extubation in OR  Informed Consent: I have reviewed the patients History and Physical, chart, labs and discussed the procedure including the risks, benefits and alternatives for the proposed anesthesia with the patient or authorized representative who has indicated his/her understanding and acceptance.   Dental advisory given  Plan Discussed with: CRNA  Anesthesia Plan Comments:         Anesthesia Quick  Evaluation

## 2015-06-24 NOTE — Anesthesia Postprocedure Evaluation (Signed)
Anesthesia Post Note  Patient: TOIYA NACHTMAN  Procedure(s) Performed: Procedure(s) (LRB): LAPAROSCOPIC ROUX-EN-Y GASTRIC BYPASS WITH UPPER ENDOSCOPY (N/A)  Patient location during evaluation: PACU Anesthesia Type: General Level of consciousness: awake and responds to stimulation Pain management: pain level controlled Vital Signs Assessment: post-procedure vital signs reviewed and stable Respiratory status: spontaneous breathing, nonlabored ventilation, respiratory function stable and patient connected to nasal cannula oxygen (Responds to stimulation and maintains O2 without stimulation, but with known OSA and snoring. Pt to go to step down with ETCO2 monitoring.) Cardiovascular status: blood pressure returned to baseline and stable Postop Assessment: no signs of nausea or vomiting Anesthetic complications: no    Last Vitals:  Filed Vitals:   06/24/15 1324 06/24/15 1347  BP:  140/82  Pulse: 68 71  Temp: 36.4 C   Resp: 27 19    Last Pain:  Filed Vitals:   06/24/15 1402  PainSc: 7                  Tiajuana Amass

## 2015-06-24 NOTE — Op Note (Signed)
Preoperative diagnosis: Roux-en-Y gastric bypass  Postoperative diagnosis: Same   Procedure: Upper endoscopy   Surgeon: Gurney Maxin, M.D.  Anesthesia: Gen.   Indications for procedure: This patient was undergoing a Roux-en-Y gastric bypass.   Description of procedure: The endoscopy was placed in the mouth and into the oropharynx and under endoscopic vision it was advanced to the esophagogastric junction. The pouch was insufflated and no bleeding or bubbles were seen. The GEJ was identified at 40cm from the teeth. The anastomosis was identified at 48cm and was widely patent. No bleeding or leaks were detected. The scope was withdrawn without difficulty.   Gurney Maxin, M.D. General, Bariatric, & Minimally Invasive Surgery [facility]

## 2015-06-25 LAB — CBC WITH DIFFERENTIAL/PLATELET
Basophils Absolute: 0 10*3/uL (ref 0.0–0.1)
Basophils Relative: 0 %
EOS ABS: 0 10*3/uL (ref 0.0–0.7)
EOS PCT: 0 %
HCT: 40.7 % (ref 36.0–46.0)
HEMOGLOBIN: 13 g/dL (ref 12.0–15.0)
LYMPHS ABS: 1 10*3/uL (ref 0.7–4.0)
Lymphocytes Relative: 9 %
MCH: 28.5 pg (ref 26.0–34.0)
MCHC: 31.9 g/dL (ref 30.0–36.0)
MCV: 89.3 fL (ref 78.0–100.0)
MONO ABS: 0.4 10*3/uL (ref 0.1–1.0)
MONOS PCT: 4 %
NEUTROS PCT: 87 %
Neutro Abs: 9.8 10*3/uL — ABNORMAL HIGH (ref 1.7–7.7)
Platelets: 228 10*3/uL (ref 150–400)
RBC: 4.56 MIL/uL (ref 3.87–5.11)
RDW: 13.5 % (ref 11.5–15.5)
WBC: 11.3 10*3/uL — ABNORMAL HIGH (ref 4.0–10.5)

## 2015-06-25 LAB — GLUCOSE, CAPILLARY
GLUCOSE-CAPILLARY: 163 mg/dL — AB (ref 65–99)
GLUCOSE-CAPILLARY: 185 mg/dL — AB (ref 65–99)
Glucose-Capillary: 158 mg/dL — ABNORMAL HIGH (ref 65–99)
Glucose-Capillary: 190 mg/dL — ABNORMAL HIGH (ref 65–99)
Glucose-Capillary: 178 mg/dL — ABNORMAL HIGH (ref 65–99)

## 2015-06-25 LAB — HEMOGLOBIN AND HEMATOCRIT, BLOOD
HEMATOCRIT: 37.6 % (ref 36.0–46.0)
HEMOGLOBIN: 12.2 g/dL (ref 12.0–15.0)

## 2015-06-25 MED ORDER — LORAZEPAM 2 MG/ML IJ SOLN
1.0000 mg | Freq: Once | INTRAMUSCULAR | Status: AC
  Administered 2015-06-25: 1 mg via INTRAVENOUS

## 2015-06-25 MED ORDER — LORAZEPAM 2 MG/ML IJ SOLN
INTRAMUSCULAR | Status: AC
  Filled 2015-06-25: qty 1

## 2015-06-25 NOTE — Progress Notes (Signed)
06/25/15  1810  Patient is requesting to have swallow evaluation done. Paged MD waiting for response.

## 2015-06-25 NOTE — Progress Notes (Signed)
Spoke with pt regarding cpap.  Pt stated she was not ready for cpap at this time.  RN reports pt will be moving to another room shortly.  Pt was advised that RT is available all night and encouraged her to call when ready for cpap.  RN aware.

## 2015-06-25 NOTE — Progress Notes (Signed)
06/25/15  1000 Patient states she did have some stomach pain with drinking the 1st 2oz of water/ice chips.

## 2015-06-25 NOTE — Progress Notes (Signed)
Patient ID: Lisa Todd, female   DOB: [birth year], 60 y.o.   MRN: [id number] 1 Day Post-Op  Subjective: Some moderate epigastric pain, better with meds.  No nausea or other C/O  Objective: Vital signs in last 24 hours: Temp:  [97.2 F (36.2 C)-99.2 F (37.3 C)] 98 F (36.7 C) (03/07 0656) Pulse Rate:  [68-97] 85 (03/07 0600) Resp:  [13-30] 18 (03/07 0600) BP: (124-188)/(67-90) 151/67 mmHg (03/07 0600) SpO2:  [94 %-100 %] 99 % (03/07 0600) Weight:  [139.708 kg (308 lb)] 139.708 kg (308 lb) (03/06 1411) Last BM Date:  (PTA)  Intake/Output from previous day: 03/06 0701 - 03/07 0700 In: 4038.3 [I.V.:3638.3; IV Piggyback:400] Out: 2120 [Urine:2110; Blood:10] Intake/Output this shift:    General appearance: alert, cooperative and no distress Resp: clear to auscultation bilaterally GI: Mild epigastric tenderness without guarding Incision/Wound: Clean and dry  Lab Results:   Recent Labs  06/24/15 2026 06/25/15 0317  WBC  --  11.3*  HGB 13.9 13.0  HCT 42.1 40.7  PLT  --  228   BMET No results for input(s): NA, K, CL, CO2, GLUCOSE, BUN, CREATININE, CALCIUM in the last 72 hours.   Studies/Results: No results found.  Anti-infectives: Anti-infectives    Start     Dose/Rate Route Frequency Ordered Stop   06/24/15 0510  cefOXitin (MEFOXIN) 2 g in dextrose 5 % 50 mL IVPB     2 g 100 mL/hr over 30 Minutes Intravenous On call to O.R. 06/24/15 0510 06/24/15 0935      Assessment/Plan: s/p Procedure(s): LAPAROSCOPIC ROUX-EN-Y GASTRIC BYPASS WITH UPPER ENDOSCOPY Stable post op without apparent complications Transfer to floor Start POD 1 diet   LOS: 1 day    [name] T 06/25/2015

## 2015-06-25 NOTE — Progress Notes (Signed)
06/25/15  0815  POD #1 Diet started with 2oz of water and ice chips. Patient adviced to drink very slowly over one hour and to allow ice chips to melt in mouth before swallowing. Patient verbalized understanding.

## 2015-06-25 NOTE — Progress Notes (Signed)
06/25/15 1840  Per Dr Hassell Done order UGI for in the morning and make patient NPO after midnight tonight. Order place with help of Agricultural consultant. Pt aware she has to be NPO after midnight tonight.

## 2015-06-25 NOTE — Progress Notes (Signed)
Patient alert and oriented, Post op day 1.  Provided support and encouragement.  Encouraged pulmonary toilet, ambulation and small sips of liquids.  All questions answered.  Will continue to monitor. 

## 2015-06-25 NOTE — Plan of Care (Signed)
Problem: Food- and Nutrition-Related Knowledge Deficit (NB-1.1) Goal: Nutrition education Formal process to instruct or train a patient/client in a skill or to impart knowledge to help patients/clients voluntarily manage or modify food choices and eating behavior to maintain or improve health. Outcome: Completed/Met Date Met:  06/25/15 Nutrition Education Note  Received consult for diet education per DROP protocol.   Discussed 2 week post op diet with pt. Emphasized that liquids must be non carbonated, non caffeinated, and sugar free. Fluid goals discussed. Pt to follow up with outpatient bariatric RD for further diet progression after 2 weeks. Multivitamins and minerals also reviewed. Teach back method used, pt expressed understanding, expect good compliance.   Diet: First 2 Weeks  You will see the dietitian about two (2) weeks after your surgery. The dietitian will increase the types of foods you can eat if you are handling liquids well:  If you have severe vomiting or nausea and cannot handle clear liquids lasting longer than 1 day, call your surgeon  Protein Shake  Drink at least 2 ounces of shake 5-6 times per day  Each serving of protein shakes (usually 8 - 12 ounces) should have a minimum of:  15 grams of protein  And no more than 5 grams of carbohydrate  Goal for protein each day:  Men = 80 grams per day  Women = 60 grams per day  Protein powder may be added to fluids such as non-fat milk or Lactaid milk or Soy milk (limit to 35 grams added protein powder per serving)   Hydration  Slowly increase the amount of water and other clear liquids as tolerated (See Acceptable Fluids)  Slowly increase the amount of protein shake as tolerated  Sip fluids slowly and throughout the day  May use sugar substitutes in small amounts (no more than 6 - 8 packets per day; i.e. Splenda)   Fluid Goal  The first goal is to drink at least 8 ounces of protein shake/drink per day (or as directed by the  nutritionist); some examples of protein shakes are [employer], [employer], EAS Edge HP, and Unjury. See handout from pre-op Bariatric Education Class:  Slowly increase the amount of protein shake you drink as tolerated  You may find it easier to slowly sip shakes throughout the day  It is important to get your proteins in first  Your fluid goal is to drink 64 - 100 ounces of fluid daily  It may take a few weeks to build up to this  32 oz (or more) should be clear liquids  And  32 oz (or more) should be full liquids (see below for examples)  Liquids should not contain sugar, caffeine, or carbonation   Clear Liquids:  Water or Sugar-free flavored water (i.e. Fruit H2O, Propel)  Decaffeinated coffee or tea (sugar-free)  Crystal Lite, Wyler's Lite, Minute Maid Lite  Sugar-free Jell-O  Bouillon or broth  Sugar-free Popsicle: *Less than 20 calories each; Limit 1 per day   Full Liquids:  Protein Shakes/Drinks + 2 choices per day of other full liquids  Full liquids must be:  No More Than 12 grams of Carbs per serving  No More Than 3 grams of Fat per serving  Strained low-fat cream soup  Non-Fat milk  Fat-free Lactaid Milk  Sugar-free yogurt (Dannon Lite & Fit, Greek yogurt)     Clayton Bibles, MS, RD, LDN Pager: [phone] After Hours Pager: [phone]

## 2015-06-25 NOTE — Evaluation (Signed)
Physical Therapy Evaluation Patient Details Name: Lisa Todd MRN: [id number] DOB: Jan 29, 1956 Today's Date: 06/25/2015   History of Present Illness  s/p gastric bypass 06/24/15, severe DJD bilateral knees.  Clinical Impression  Patient is very motivated to progress. HR 132, sats 100% on RA. Patient reports that she has oxygen at home, although her sats were good today. The patient does live alone  And has had multiple falls due to DJD of knees. Patient will benefit from PT to address problems listed in the note below.  Recommend OT consult.    Follow Up Recommendations SNF;Supervision/Assistance - 24 hour-patient lives alone.    Equipment Recommendations  Rolling walker with 5" wheels (4 wheeled )    Recommendations for Other Services OT consult     Precautions / Restrictions Precautions Precautions: Fall Precaution Comments: reports multiple falls, knees buckle, to have TKA in future. monitor sats and HR(130's)-states home with O2 but sats were fine on RA.      Mobility  Bed Mobility Overal bed mobility: Needs Assistance;+2 for physical assistance;+ 2 for safety/equipment Bed Mobility: Sidelying to Sit   Sidelying to sit: Mod assist;HOB elevated       General bed mobility comments: cues for technique  Transfers Overall transfer level: Needs assistance Equipment used: Rolling walker (2 wheeled) Transfers: Sit to/from Stand Sit to Stand: Mod assist;+2 physical assistance;+2 safety/equipment         General transfer comment: required assist to power up from bed, used arm rests from [facility], extra time to rise.  Ambulation/Gait Ambulation/Gait assistance: Mod assist;+2 safety/equipment Ambulation Distance (Feet): 150 Feet (then 50') Assistive device: Rolling walker (2 wheeled) Gait Pattern/deviations: Step-through pattern;Wide base of support;Trunk flexed     General Gait Details: very slow  speed, stop for breaks while standing. Limited  Stairs             Wheelchair Mobility    Modified Rankin (Stroke Patients Only)       Balance                                             Pertinent Vitals/Pain Pain Assessment: 0-10 Pain Score: 10-Worst pain ever Pain Location: knees, abdomen, HA Pain Descriptors / Indicators: Aching;Discomfort;Grimacing Pain Intervention(s): Limited activity within patient's tolerance;Monitored during session;Premedicated before session;Patient requesting pain meds-RN notified    Home Living Family/patient expects to be discharged to:: Private residence   Available Help at Discharge: Family Type of Home: House Home Access: Stairs to enter Entrance Stairs-Rails: None Entrance Stairs-Number of Steps: 3-4 Home Layout: Two level;Able to live on main level with bedroom/bathroom Home Equipment: None      Prior Function Level of Independence: Independent;Needs assistance   Gait / Transfers Assistance Needed: independent to ambulate  ADL's / Homemaking Assistance Needed: gets down into tub and needs assistance to get out, grandson and son come and  help.        Hand Dominance        Extremity/Trunk Assessment   Upper Extremity Assessment: Overall WFL for tasks assessed           Lower Extremity Assessment: RLE deficits/detail;LLE deficits/detail RLE Deficits / Details: crepitus in knees. LLE Deficits / Details: same as right  Cervical / Trunk Assessment: Normal  Communication   Communication: No difficulties  Cognition Arousal/Alertness: Awake/alert Behavior During Therapy: WFL for tasks assessed/performed Overall Cognitive Status: Within Functional  Limits for tasks assessed                      General Comments      Exercises        Assessment/Plan    PT Assessment Patient needs continued PT services  PT Diagnosis Difficulty walking;Generalized weakness;Acute pain   PT Problem List Decreased strength;Decreased activity tolerance;Decreased  balance;Decreased mobility;Decreased knowledge of use of DME;Decreased safety awareness;Decreased knowledge of precautions;Pain;Cardiopulmonary status limiting activity  PT Treatment Interventions DME instruction;Gait training;Stair training;Functional mobility training;Therapeutic activities;Therapeutic exercise;Patient/family education   PT Goals (Current goals can be found in the Care Plan section) Acute Rehab PT Goals Patient Stated Goal: to go home, get knee surgery PT Goal Formulation: With patient Time For Goal Achievement: 06/25/15 Potential to Achieve Goals: Good    Frequency Min 3X/week   Barriers to discharge Decreased caregiver support      Co-evaluation               End of Session   Activity Tolerance: Patient tolerated treatment well Patient left: in chair;with call bell/phone within reach Nurse Communication: Mobility status         Time: 0912-1009 PT Time Calculation (min) (ACUTE ONLY): 57 min   Charges:   PT Evaluation $PT Eval Low Complexity: 1 Procedure PT Treatments $Gait Training: 23-37 mins   PT G Codes:        Claretha Cooper 06/25/2015, 1:29 PM Tresa Endo PT [phone]

## 2015-06-26 LAB — CBC WITH DIFFERENTIAL/PLATELET
BASOS PCT: 0 %
Basophils Absolute: 0 10*3/uL (ref 0.0–0.1)
Eosinophils Absolute: 0 10*3/uL (ref 0.0–0.7)
Eosinophils Relative: 0 %
HEMATOCRIT: 34.5 % — AB (ref 36.0–46.0)
HEMOGLOBIN: 10.8 g/dL — AB (ref 12.0–15.0)
LYMPHS ABS: 2.1 10*3/uL (ref 0.7–4.0)
Lymphocytes Relative: 22 %
MCH: 27.9 pg (ref 26.0–34.0)
MCHC: 31.3 g/dL (ref 30.0–36.0)
MCV: 89.1 fL (ref 78.0–100.0)
MONOS PCT: 6 %
Monocytes Absolute: 0.6 10*3/uL (ref 0.1–1.0)
NEUTROS ABS: 7.2 10*3/uL (ref 1.7–7.7)
NEUTROS PCT: 72 %
Platelets: 193 10*3/uL (ref 150–400)
RBC: 3.87 MIL/uL (ref 3.87–5.11)
RDW: 13.5 % (ref 11.5–15.5)
WBC: 9.9 10*3/uL (ref 4.0–10.5)

## 2015-06-26 LAB — GLUCOSE, CAPILLARY
GLUCOSE-CAPILLARY: 184 mg/dL — AB (ref 65–99)
GLUCOSE-CAPILLARY: 102 mg/dL — AB (ref 65–99)
Glucose-Capillary: 130 mg/dL — ABNORMAL HIGH (ref 65–99)
Glucose-Capillary: 143 mg/dL — ABNORMAL HIGH (ref 65–99)
Glucose-Capillary: 143 mg/dL — ABNORMAL HIGH (ref 65–99)
Glucose-Capillary: 160 mg/dL — ABNORMAL HIGH (ref 65–99)

## 2015-06-26 MED FILL — oxyCODONE HCL 5 MG/5ML SOLN: 5 | 3 days supply | Qty: 200 | Fill #0

## 2015-06-26 NOTE — Progress Notes (Signed)
Pt drank 4oz of shake with no worsening abd pain or nausea. Preparing pt for D/C today.  Roselind Rily

## 2015-06-26 NOTE — Progress Notes (Signed)
CSW met with the Pt at the bedside and introduced self. Pt was not aware that she was recommended for SNF and requested that the CSW explain the difference between going home and to a SNF. CSW gave a description of both therapy opportunities and the Pt chose to go home with HHPT.   Pt stated that she has "lots of help" and would prefer to go home. Pt stated that her home is spacious and she has only two stairs to enter her home. Pt's daughter lives next door. Pt expressed that she was pleased with the care she received a [facility] and that she is excited to return home for recovery.   While CSW was in the room the CM came to see the Pt. CSW explained the Pt's desire to return home and turned the conversation over to the CM to assist the Pt with equipment for home therapy.   CSW signing off.   Please reconsult if things were to change.   Pete Pelt [facility]  [phone]

## 2015-06-26 NOTE — Progress Notes (Signed)
[facility]  4-wheel walker and tub seat with back to be delivered to patient's home. [facility] will call her to confirm delivery.  Lisa Todd 06/26/2015, 12:11 PM

## 2015-06-26 NOTE — Care Management Note (Signed)
Case Management Note  Patient Details  Name: JAINI MIERZWA MRN: [id number] Date of Birth: 04/15/1956  Subjective/Objective:   Patient concerned about glucometer that she needs-informed her & nsg that a script for glucometer w/strips,supplies can be given to her & she can take it to any local pharmacy to be filled.Also patient asked about her meds @ d/c-informed her to talk to nsg who will let dr know about scripts to write for her to take to her local pharmacy since she has script coverage.Patient voiced understanding. Nsg notified to discuss all nsg concerns-scripts,meds,d/c instructions.                Action/Plan:d/c home w/HHC/DME(to be delivered to home)   Expected Discharge Date:                  Expected Discharge Plan:  [place]  In-House Referral:     Discharge planning Services  CM Consult  Post Acute Care Choice:    Choice offered to:  Patient  DME Arranged:  Shower stool, Walker rolling with seat DME Agency:  [place]:  PT [address]:  [place]  Status of Service:  Completed, signed off  Medicare Important Message Given:    Date Medicare IM Given:    Medicare IM give by:    Date Additional Medicare IM Given:    Additional Medicare Important Message give by:     If discussed at [place] of Stay Meetings, dates discussed:    Additional Comments:  Dessa Phi, RN 06/26/2015, 12:01 PM

## 2015-06-26 NOTE — Evaluation (Signed)
Occupational Therapy Evaluation Patient Details Name: Lisa Todd MRN: [id number] DOB: [birth year] Today's Date: 06/26/2015    History of Present Illness s/p gastric bypass 06/24/15, severe DJD bilateral knees.   Clinical Impression   This 60 year old female was admitted for the above. Will follow in acute setting with min guard to min A +2 safety goals as pt has knees that buckle and has fallen.    Follow Up Recommendations  Supervision/Assistance - 24 hour    Equipment Recommendations   (tub seat (not agreeable to tub bench) and grab bar or safety frame for toilet)    Recommendations for Other Services       Precautions / Restrictions Precautions Precautions: Fall Restrictions Weight Bearing Restrictions: No      Mobility Bed Mobility       Sidelying to sit: Supervision;HOB elevated (used rail)          Transfers                 General transfer comment: NT    Balance                                            ADL Overall ADL's : Needs assistance/impaired                                       General ADL Comments: performed evaluation from [facility].  Noted pt's knees buckle at times and she has a h/o falls.  Did not stand during evaluation. Also noted that pt spoke to [place] from [facility] and ordered a tub seat with a back. Talked to pt about tub bench for increased safety:  She states that she has had one of these in the past and didn't have room for it.  She states that she will use it at the sink initially.  Provided AE kit for LB adls.  Without AE, pt needs mod A for LB bathing and max A for LB dressing (+2 for safety with standing).  Pt was able to return demonstrate reacher, sock aide and sponge.  Pt has a low commode at home.  Bariatric commode likely would not fit over toilet due to rigid bar on back--she doesn't want to deal with this as a BSC.  She may need to have grab bar installed.  [facility] does not have a  safety frame to go over toilet.     Vision     Perception     Praxis      Pertinent Vitals/Pain Pain Assessment: Faces Pain Score: 2  Pain Location: abdomen Pain Descriptors / Indicators: Sore Pain Intervention(s): Limited activity within patient's tolerance;Monitored during session     Hand Dominance     Extremity/Trunk Assessment Upper Extremity Assessment Upper Extremity Assessment: Overall WFL for tasks assessed           Communication Communication Communication: No difficulties   Cognition Arousal/Alertness: Awake/alert Behavior During Therapy: WFL for tasks assessed/performed Overall Cognitive Status: Within Functional Limits for tasks assessed                     General Comments       Exercises       Shoulder Instructions      Home Living Family/patient expects to be discharged  to:: Private residence Living Arrangements: Alone Available Help at Discharge: Family               Bathroom Shower/Tub: Tub/shower unit Shower/tub characteristics: Architectural technologist: Standard     Home Equipment: None          Prior Functioning/Environment    Gait / Transfers Assistance Needed: independent to ambulate ADL's / Homemaking Assistance Needed: gets down into tub and needs assistance to get out, grandson and son come and  help.        OT Diagnosis: Acute pain   OT Problem List: Decreased strength;Pain;Decreased knowledge of use of DME or AE;Impaired UE functional use   OT Treatment/Interventions: Self-care/ADL training;DME and/or AE instruction;Patient/family education    OT Goals(Current goals can be found in the care plan section) Acute Rehab OT Goals Patient Stated Goal: to go home, get knee surgery OT Goal Formulation: With patient Time For Goal Achievement: 07/03/15 Potential to Achieve Goals: Good ADL Goals Pt Will Transfer to Toilet: with min guard assist;ambulating (comfort height commode, +2 safety) Pt Will Perform  Tub/Shower Transfer: Tub transfer;with min assist;ambulating;shower seat (+2 safety) Additional ADL Goal #1: pt will complete adl with AE with min guard, sit to stand and set up  OT Frequency: Min 2X/week   Barriers to D/C:            Co-evaluation              End of Session    Activity Tolerance: Patient tolerated treatment well Patient left: in bed;with call bell/phone within reach;with nursing/sitter in room   Time: 3818-2993 OT Time Calculation (min): 15 min Charges:  OT General Charges $OT Visit: 1 Procedure OT Evaluation $OT Eval Low Complexity: 1 Procedure G-Codes:    Chelbie Jarnagin 07-12-15, 4:24 PM Lesle Chris, OTR/L [phone] 07-12-2015

## 2015-06-26 NOTE — Progress Notes (Signed)
Patient alert and oriented, Post op day 2.  Provided support and encouragement.  Encouraged pulmonary toilet, ambulation and small sips of liquids.  All questions answered.  Will continue to monitor.  Patient continues to be confused about UGI, at pre-op class explained to patient that her surgeon ordered tests based on her medical history and if he did not order it she did not need it.  Also explained at pre-op class that she would not have an UGI in the hospital per her surgeon does not routinely order them.  Yesterday at the bedside repeated the same discussion.  This morning I have explained again why the UGI was not ordered.  Also explained that increasing volume as TOLERATED is an expectation, she should listen to her body for cues of being full.  Encouraged patient to not depend on the 2oz cup but to sip slowly out of a regular cup to better learn the boundaries of her pouch.    Concerned with patients ability to understand post-operative expectations, patient has repeated the same story to me multiple times this admission, unsure if this is related to medication or baseline.
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
## 2015-06-26 NOTE — Progress Notes (Signed)
Patient ID: EMEREE TRUNZO, female   DOB: 10-27-1955, 60 y.o.   MRN: [id number] 2 Days Post-Op  Subjective: Was nauseated yesterday with unlimited water but did fine with smaller amounts. Somewhat upset about confusion regarding swallowing study orders. This morning she is feeling pretty well. Denies nausea or severe pain.He slept okay.  Objective: Vital signs in last 24 hours: Temp:  [97.7 F (36.5 C)-98.3 F (36.8 C)] 98.2 F (36.8 C) (03/08 0527) Pulse Rate:  [71-94] 71 (03/08 0527) Resp:  [13-21] 18 (03/08 0527) BP: (125-162)/(56-109) 139/56 mmHg (03/08 0527) SpO2:  [98 %-100 %] 99 % (03/08 0527) Weight:  [139 kg (306 lb 7 oz)] 139 kg (306 lb 7 oz) (03/08 0000) Last BM Date: 06/23/15  Intake/Output from previous day: 03/07 0701 - 03/08 0700 In: 2520 [P.O.:120; I.V.:2300; IV Piggyback:100] Out: -  Intake/Output this shift:    General appearance: alert, cooperative and no distress Resp: clear to auscultation bilaterally GI: normal findings: soft, non-tender Incision/Wound: clean and dry  Lab Results:   Recent Labs  06/25/15 0317 06/25/15 1605 06/26/15 0425  WBC 11.3*  --  9.9  HGB 13.0 12.2 10.8*  HCT 40.7 37.6 34.5*  PLT 228  --  193   BMET No results for input(s): NA, K, CL, CO2, GLUCOSE, BUN, CREATININE, CALCIUM in the last 72 hours.  CBG (last 3)   Recent Labs  06/25/15 1645 06/26/15 06/26/15 0350  GLUCAP 178* 130* 143*     Studies/Results: No results found.  Anti-infectives: Anti-infectives    Start     Dose/Rate Route Frequency Ordered Stop   06/24/15 0510  cefOXitin (MEFOXIN) 2 g in dextrose 5 % 50 mL IVPB     2 g 100 mL/hr over 30 Minutes Intravenous On call to O.R. 06/24/15 0510 06/24/15 0935      Assessment/Plan: s/p Procedure(s): LAPAROSCOPIC ROUX-EN-Y GASTRIC BYPASS WITH UPPER ENDOSCOPY Appears to be doing well today. Advance to protein shakes.She has been ambulatory in the hall. Did well with PT. I have ordered homePT with rolling  walker. If she does well this morning with shakes and ambulation I think she is okay for discharge. We do not need a swallow study as her abdomen is benign, normal white count and vital signs and no clinical evidence of leak.   LOS: 2 days    Merridy Pascoe T 06/26/2015

## 2015-06-26 NOTE — Progress Notes (Signed)
Assessment unchanged.  IV removed. All questions pertaining to D/C we answered.  Pt was D/C'd via wheelchair and accompanied by NT.

## 2015-06-26 NOTE — Progress Notes (Signed)
Inpatient Diabetes Program Recommendations  AACE/ADA: New Consensus Statement on Inpatient Glycemic Control (2015)  Target Ranges:  Prepandial:   less than 140 mg/dL      Peak postprandial:   less than 180 mg/dL (1-2 hours)      Critically ill patients:  140 - 180 mg/dL   Review of Glycemic Control  Diabetes history: DM2 Outpatient Diabetes medications: Januvia 100 mg QD, glipizide 10 mg QD, Novolog 9-25 units tidwc, Lantus 100 units QHS Current orders for Inpatient glycemic control: Novolog moderate Q4H  Results for SHASTA, BARBAREE (MRN [id number]) as of 06/26/2015 11:49  Ref. Range 06/25/2015 16:45 06/25/2015 19:34 06/26/2015 00:00 06/26/2015 03:50 06/26/2015 08:25  Glucose-Capillary Latest Ref Range: 65-99 mg/dL 178 (H) 184 (H) 130 (H) 143 (H) 102 (H)  Results for LESLY, BIRES (MRN [id number]) as of 06/26/2015 11:49  Ref. Range 06/18/2015 11:00  Hemoglobin A1C Latest Ref Range: 4.8-5.6 % 8.9 (H)  Blood sugars trending well. Inpatient Diabetes Program Recommendations:    If FBS begin to increase > 180 mg/dL, would benefit from addition of basal insulin (Lantus 15 units QD)  Will follow while inpatient. Looks good so far. Thank you. Lorenda Peck, RD, LDN, CDE Inpatient Diabetes Coordinator [phone]

## 2015-06-26 NOTE — Discharge Instructions (Signed)

## 2015-06-26 NOTE — Progress Notes (Signed)
Patient alert and oriented, pain is controlled. Patient is tolerating fluids, advanced to protein shake today. Reviewed Gastric Bypass discharge instructions with patient and patient is able to articulate understanding. Provided information on BELT program, Support Group and [facility] outpatient pharmacy. All questions answered, will continue to monitor.    

## 2015-06-26 NOTE — Care Management Note (Signed)
Case Management Note  Patient Details  Name: Lisa Todd MRN: [id number] Date of Birth: 1955-08-19  Subjective/Objective: [facility] rep Santiago Glad aware of HHPT, await f57f, d/c order in. [facility] dme rep Lecretia-aware of shower chair, rollator, to be delivered to patient's home-patient agreed.Informed patient that back brush is not a dme provided by [facility] can get one @ local pharmacy-patient voiced understanding. Patient able to transport home on own.                  Action/Plan:d/c home.   Expected Discharge Date:                  Expected Discharge Plan:  [place]  In-House Referral:     Discharge planning Services  CM Consult  Post Acute Care Choice:    Choice offered to:  Patient  DME Arranged:  Shower stool, Walker rolling with seat DME Agency:  [place]:  PT [place]:  [place]  Status of Service:  Completed, signed off  Medicare Important Message Given:    Date Medicare IM Given:    Medicare IM give by:    Date Additional Medicare IM Given:    Additional Medicare Important Message give by:     If discussed at [place] of Stay Meetings, dates discussed:    Additional Comments:  Dessa Phi, RN 06/26/2015, 11:52 AM

## 2015-06-28 NOTE — Discharge Summary (Signed)
Patient ID: Lisa Todd [id number] 59 y.o. [birth year]  06/24/2015  Discharge date and time: 06/26/2015   Admitting Physician: Excell Seltzer T  Discharge Physician: Excell Seltzer T  Admission Diagnoses: morbid obesity  Discharge Diagnoses: Same  Operations: Procedure(s): LAPAROSCOPIC ROUX-EN-Y GASTRIC BYPASS WITH UPPER ENDOSCOPY  Admission Condition: fair  Discharged Condition: fair  Indication for Admission: Patient is a 60 year old female with progressive extreme morbid obesity unresponsive to medical management who presents at a BMI of 59 with comorbidities of insulin-dependent diabetes mellitus, DJD with bone-on-bone arthritis, obstructive sleep apnea, and asthma. After extensive preoperative consultation and workup detailed elsewhere she is electively admitted for laparoscopic Roux-en-Y gastric bypass for treatment of her morbid obesity.  Hospital Course: On the morning of admission she underwent an uneventful laparoscopic Roux-en-Y gastric bypass. Her hospitalization was uncomplicated although due to her arthritis and extreme morbid obesity mobilization was somewhat difficult. She did have nausea over the first 24 hours but was able to tolerate moderate water and ice chips. Physical therapy was consulted for mobilization. Home physical therapy was recommended and arranged. By the second postoperative day her nausea had resolved. White count was normal. Vital signs were stable and her abdomen was benign. She was ambulatory with a walker and tolerating protein shakes. Wounds clean and dry. She is felt ready for discharge.  Consults: PT/OT   Disposition: Home  Patient Instructions:    Medication List    STOP taking these medications        aspirin EC 81 MG tablet     diclofenac 75 MG EC tablet  Commonly known as:  VOLTAREN     ibuprofen 600 MG tablet  Commonly known as:  ADVIL,MOTRIN     ibuprofen 800 MG tablet  Commonly known as:  ADVIL,MOTRIN      TAKE  these medications        ALPRAZolam 1 MG tablet  Commonly known as:  XANAX  Take 1 tablet by mouth 4 (four) times daily.     cyclobenzaprine 10 MG tablet  Commonly known as:  FLEXERIL  Take 1 tablet (10 mg total) by mouth 3 (three) times daily as needed for muscle spasms.     diclofenac sodium 1 % Gel  Commonly known as:  VOLTAREN  Apply 1 application topically at bedtime. For arthritis pain     CARDIZEM LA 360 MG 24 hr tablet  Generic drug:  diltiazem  Take 360 mg by mouth daily.     diltiazem 240 MG 24 hr capsule  Commonly known as:  CARDIZEM CD  Take 240 mg by mouth daily.  Notes to Patient:  Patient is no longer taking.  This medication cannot be crushed or cut in half, if too large to swallow will need to discuss with PCP to change to immediate release formula     esomeprazole 40 MG capsule  Commonly known as:  NEXIUM  Take 40 mg by mouth daily before breakfast.     Fenofibric Acid 135 MG Cpdr  Take 135 mg by mouth every evening.     glipiZIDE 10 MG tablet  Commonly known as:  GLUCOTROL  Take 10 mg by mouth daily.  Notes to Patient:  Monitor Blood Sugar Frequently and keep a log for primary care physician, you may need to adjust medication dosage with rapid weight loss.        HYDROcodone-acetaminophen 5-325 MG tablet  Commonly known as:  NORCO/VICODIN  Take 1 tablet by mouth every 4 (four) hours as needed.  insulin glargine 100 UNIT/ML injection  Commonly known as:  LANTUS  Inject 100 Units into the skin at bedtime.  Notes to Patient:  Monitor Blood Sugar Frequently and keep a log for primary care physician, you may need to adjust medication dosage with rapid weight loss.        JANUVIA 100 MG tablet  Generic drug:  sitaGLIPtin  Take 100 mg by mouth daily.  Notes to Patient:  Monitor Blood Sugar Frequently and keep a log for primary care physician, you may need to adjust medication dosage with rapid weight loss.        NOVOLOG FLEXPEN 100 UNIT/ML injection   Generic drug:  insulin aspart  Inject 9-25 Units into the skin 3 (three) times daily before meals. For sugar per sliding scale  Notes to Patient:  Monitor Blood Sugar Frequently and keep a log for primary care physician, you may need to adjust medication dosage with rapid weight loss.        oxyCODONE-acetaminophen 5-325 MG tablet  Commonly known as:  PERCOCET/ROXICET  Take 1 tablet by mouth every 6 (six) hours as needed.  Notes to Patient:  Do not take while taking liquid oxycodone     oxyCODONE-acetaminophen 10-325 MG tablet  Commonly known as:  PERCOCET  Take 1 tablet by mouth every 6 (six) hours as needed for pain.  Notes to Patient:  Do not take while taking liquid oxycodone     phentermine 37.5 MG capsule  Take 37.5 mg by mouth every morning.     pravastatin 10 MG tablet  Commonly known as:  PRAVACHOL  Take 10 mg by mouth daily.     PROAIR HFA 108 (90 Base) MCG/ACT inhaler  Generic drug:  albuterol  USE 2 PUFFS EVERY 6 TO 8 HOURS AS NEEDED FOR WHEEZING.     albuterol 108 (90 Base) MCG/ACT inhaler  Commonly known as:  PROVENTIL HFA;VENTOLIN HFA  Inhale 1-2 puffs into the lungs every 6 (six) hours as needed for wheezing.        Activity: activity as tolerated Diet: bariatric protein shakes Wound Care: none needed  Follow-up:  With Dr. Excell Seltzer in 3 weeks.  Signed: Edward Jolly MD, FACS  06/28/2015, 4:38 PM

## 2015-07-02 ENCOUNTER — Emergency Department (HOSPITAL_COMMUNITY)
Admission: EM | Admit: 2015-07-02 | Discharge: 2015-07-03 | Disposition: A | Discharge status: Home / Self Care | Payer: Medicare Other | Attending: Emergency Medicine | Admitting: Emergency Medicine

## 2015-07-02 DIAGNOSIS — Z794 Long term (current) use of insulin: Secondary | ICD-10-CM | POA: Insufficient documentation

## 2015-07-02 DIAGNOSIS — E119 Type 2 diabetes mellitus without complications: Secondary | ICD-10-CM | POA: Diagnosis not present

## 2015-07-02 DIAGNOSIS — I10 Essential (primary) hypertension: Secondary | ICD-10-CM | POA: Insufficient documentation

## 2015-07-02 DIAGNOSIS — E669 Obesity, unspecified: Secondary | ICD-10-CM | POA: Insufficient documentation

## 2015-07-02 DIAGNOSIS — K219 Gastro-esophageal reflux disease without esophagitis: Secondary | ICD-10-CM | POA: Diagnosis not present

## 2015-07-02 DIAGNOSIS — M1990 Unspecified osteoarthritis, unspecified site: Secondary | ICD-10-CM | POA: Insufficient documentation

## 2015-07-02 DIAGNOSIS — F329 Major depressive disorder, single episode, unspecified: Secondary | ICD-10-CM | POA: Insufficient documentation

## 2015-07-02 DIAGNOSIS — R109 Unspecified abdominal pain: Secondary | ICD-10-CM | POA: Diagnosis present

## 2015-07-02 DIAGNOSIS — Z9884 Bariatric surgery status: Secondary | ICD-10-CM | POA: Diagnosis not present

## 2015-07-02 DIAGNOSIS — R1084 Generalized abdominal pain: Secondary | ICD-10-CM | POA: Insufficient documentation

## 2015-07-02 DIAGNOSIS — G8929 Other chronic pain: Secondary | ICD-10-CM | POA: Insufficient documentation

## 2015-07-02 DIAGNOSIS — Z79899 Other long term (current) drug therapy: Secondary | ICD-10-CM | POA: Insufficient documentation

## 2015-07-02 DIAGNOSIS — Z7984 Long term (current) use of oral hypoglycemic drugs: Secondary | ICD-10-CM | POA: Diagnosis not present

## 2015-07-02 DIAGNOSIS — R112 Nausea with vomiting, unspecified: Secondary | ICD-10-CM | POA: Insufficient documentation

## 2015-07-02 DIAGNOSIS — J45909 Unspecified asthma, uncomplicated: Secondary | ICD-10-CM | POA: Diagnosis not present

## 2015-07-02 DIAGNOSIS — E785 Hyperlipidemia, unspecified: Secondary | ICD-10-CM | POA: Insufficient documentation

## 2015-07-02 NOTE — ED Notes (Signed)
Patient ate egg yogurt. Patient started vomiting. Patient had gastric bypass on 06/24/2015. Patient says she is hurting from vomiting so much. Patient is nauseated.

## 2015-07-03 ENCOUNTER — Emergency Department (HOSPITAL_COMMUNITY): Payer: Medicare Other

## 2015-07-03 ENCOUNTER — Encounter (HOSPITAL_COMMUNITY): Payer: Self-pay

## 2015-07-03 DIAGNOSIS — R1084 Generalized abdominal pain: Secondary | ICD-10-CM | POA: Diagnosis not present

## 2015-07-03 LAB — CBC WITH DIFFERENTIAL/PLATELET
BASOS ABS: 0 10*3/uL (ref 0.0–0.1)
BASOS PCT: 0 %
EOS ABS: 0.1 10*3/uL (ref 0.0–0.7)
Eosinophils Relative: 1 %
HCT: 38.9 % (ref 36.0–46.0)
HEMOGLOBIN: 12.8 g/dL (ref 12.0–15.0)
Lymphocytes Relative: 33 %
Lymphs Abs: 3.6 10*3/uL (ref 0.7–4.0)
MCH: 27.9 pg (ref 26.0–34.0)
MCHC: 32.9 g/dL (ref 30.0–36.0)
MCV: 84.9 fL (ref 78.0–100.0)
Monocytes Absolute: 0.9 10*3/uL (ref 0.1–1.0)
Monocytes Relative: 8 %
NEUTROS PCT: 58 %
Neutro Abs: 6.4 10*3/uL (ref 1.7–7.7)
Platelets: 279 10*3/uL (ref 150–400)
RBC: 4.58 MIL/uL (ref 3.87–5.11)
RDW: 13.4 % (ref 11.5–15.5)
WBC: 11 10*3/uL — AB (ref 4.0–10.5)

## 2015-07-03 LAB — URINALYSIS, ROUTINE W REFLEX MICROSCOPIC
Glucose, UA: NEGATIVE mg/dL
Hgb urine dipstick: NEGATIVE
Ketones, ur: 40 mg/dL — AB
Leukocytes, UA: NEGATIVE
NITRITE: NEGATIVE
Protein, ur: 30 mg/dL — AB
SPECIFIC GRAVITY, URINE: 1.031 — AB (ref 1.005–1.030)
pH: 5.5 (ref 5.0–8.0)

## 2015-07-03 LAB — BASIC METABOLIC PANEL
Anion gap: 13 (ref 5–15)
BUN: 12 mg/dL (ref 6–20)
CHLORIDE: 103 mmol/L (ref 101–111)
CO2: 22 mmol/L (ref 22–32)
CREATININE: 0.53 mg/dL (ref 0.44–1.00)
Calcium: 9.1 mg/dL (ref 8.9–10.3)
GFR calc non Af Amer: >60 mL/min (ref >60)
Glucose, Bld: 161 mg/dL — ABNORMAL HIGH (ref 65–99)
POTASSIUM: 4.1 mmol/L (ref 3.5–5.1)
SODIUM: 138 mmol/L (ref 135–145)

## 2015-07-03 LAB — URINE MICROSCOPIC-ADD ON

## 2015-07-03 MED ORDER — MORPHINE SULFATE (PF) 4 MG/ML IV SOLN
4.0000 mg | Freq: Once | INTRAVENOUS | Status: AC
  Administered 2015-07-03: 4 mg via INTRAVENOUS

## 2015-07-03 MED ORDER — HYDROMORPHONE HCL 1 MG/ML IJ SOLN
0.5000 mg | Freq: Once | INTRAMUSCULAR | Status: AC
  Administered 2015-07-03: 0.5 mg via INTRAVENOUS

## 2015-07-03 MED ORDER — HYDROMORPHONE HCL 1 MG/ML IJ SOLN
INTRAMUSCULAR | Status: AC
  Filled 2015-07-03: qty 1

## 2015-07-03 MED ORDER — SODIUM CHLORIDE 0.9 % IV BOLUS (SEPSIS)
1000.0000 mL | Freq: Once | INTRAVENOUS | Status: AC
  Administered 2015-07-03: 1000 mL via INTRAVENOUS

## 2015-07-03 MED ORDER — MORPHINE SULFATE (PF) 4 MG/ML IV SOLN
4.0000 mg | Freq: Once | INTRAVENOUS | Status: AC
  Administered 2015-07-03: 4 mg via INTRAVENOUS
  Filled 2015-07-03: qty 1

## 2015-07-03 MED ORDER — MORPHINE SULFATE (PF) 4 MG/ML IV SOLN
INTRAVENOUS | Status: AC
  Filled 2015-07-03: qty 1

## 2015-07-03 MED ORDER — ONDANSETRON HCL 4 MG PO TABS: 4.0000 mg | ORAL_TABLET | Freq: Four times a day (QID) | ORAL | Status: DC

## 2015-07-03 MED ORDER — ONDANSETRON HCL 4 MG/2ML IJ SOLN
4.0000 mg | Freq: Once | INTRAMUSCULAR | Status: AC
  Administered 2015-07-03: 4 mg via INTRAVENOUS
  Filled 2015-07-03: qty 2

## 2015-07-03 MED ORDER — IOHEXOL 300 MG/ML  SOLN
100.0000 mL | Freq: Once | INTRAMUSCULAR | Status: AC | PRN
  Administered 2015-07-03: 100 mL via INTRAVENOUS

## 2015-07-03 NOTE — ED Provider Notes (Signed)
  Physical Exam  BP 124/51 mmHg  Pulse 83  Temp(Src) 98.1 F (36.7 C) (Oral)  Resp 16  Ht 5\' 3"  (1.6 m)  Wt 146.512 kg  BMI 57.23 kg/m2  SpO2 99%  Physical Exam  ED Course  Procedures  MDM Sign out from Delos Haring, PA-C Presents with Emesis Hx gastric Bypass CT- Mild fluid distention. Question patch integrity  General Surgery to see (Dr. Rosendo Gros) Dispo Pending consult  7:29 AM- Surgery has seen and evaluated patient. Ok to DC home. Continue PO and Clears. Patient is in no acute distress. Vital Signs are stable. Patient is able to ambulate. Patient able to tolerate PO.       Shary Decamp, PA-C 07/03/15 [place], MD 07/04/15 [phone]

## 2015-07-03 NOTE — ED Notes (Signed)
Patient transported to X-ray 

## 2015-07-03 NOTE — ED Provider Notes (Signed)
CSN: [id number]     Arrival date & time 07/02/15  2142 History   First MD Initiated Contact with Patient 07/03/15 0135     Chief Complaint  Patient presents with  . Allergic Reaction     (Consider location/radiation/quality/duration/timing/severity/associated sxs/prior Treatment) HPI  Patient to the ER chronic back pain, hyperlipidemia, asthma, depression, obesity, hypertension, diabetes, arthritis, GERD with recent roux-en-y by Dr. Excell Seltzer on 06/24/2015. She ate egg putting today and knew she should not have eaten it but ate it anyways yesterday around 7 pm on Monday . She started vomiting afterwards and has been vomiting ever since. She has been unable to tolerate any PO. Went to [facility] with concern of dehydration and they called Dr. Excell Seltzer office and they recommend that he come to [place] where the surgery took place. She is having pain diffusely from her belly button down. No blood in vomit, diarrhea. Regular bowel movements. No fever.   PCP: Robert Bellow, MD  Lisa Todd is a 60 y.o.  female  ROS: The patient denies diaphoresis, fever, headache, weakness (general or focal), confusion, change of vision,  dysphagia, aphagia, shortness of breath, diarrhea, lower extremity swelling, rash, neck pain, chest pain   Past Medical History  Diagnosis Date  . Chronic back pain   . Hyperlipidemia   . Asthma   . Depression   . Obesity   . Hypertension   . IDDM (insulin dependent diabetes mellitus) ([place])   . Diabetes mellitus   . Arthritis   . Sleep apnea     uses O2 2 liters  / currently has no cpap   . GERD (gastroesophageal reflux disease)    Past Surgical History  Procedure Laterality Date  . Vesicovaginal fistula closure w/ tah  2002  . Knee left arthroscopy  2004  . Right knee surgery  2007  . Bladder repair w/ cesarean section  1989  . Right rotator cuff surgery  11/26/2009  . Abdominal hysterectomy    . Gastric roux-en-y N/A 06/24/2015    Procedure:  LAPAROSCOPIC ROUX-EN-Y GASTRIC BYPASS WITH UPPER ENDOSCOPY;  Surgeon: Excell Seltzer, MD;  Location: [facility];  Service: General;  Laterality: N/A;   Family History  Problem Relation Age of Onset  . Breast cancer Mother   . Diabetes Mother   . Hypertension Mother   . Asthma Father   . Diabetes Father   . Hypertension Father   . Diabetes Brother   . Hypertension Brother   . Diabetes Sister   . Hypertension Sister    Social History  Substance Use Topics  . Smoking status: Never Smoker   . Smokeless tobacco: None  . Alcohol Use: No   OB History    No data available     Review of Systems  Review of Systems All other systems negative except as documented in the HPI. All pertinent positives and negatives as reviewed in the HPI.   Allergies  Liraglutide  Home Medications   Prior to Admission medications   Medication Sig Start Date End Date Taking? Authorizing Provider  albuterol (PROVENTIL HFA;VENTOLIN HFA) 108 (90 BASE) MCG/ACT inhaler Inhale 1-2 puffs into the lungs every 6 (six) hours as needed for wheezing. 08/05/12  Yes Prentiss Bells, MD  ALPRAZolam Duanne Moron) 1 MG tablet Take 1 mg by mouth 4 (four) times daily as needed for anxiety or sleep.  09/19/13  Yes Historical Provider, MD  CARDIZEM LA 360 MG 24 hr tablet Take 360 mg by mouth daily.  05/06/15  Yes Historical Provider, MD  Choline Fenofibrate (FENOFIBRIC ACID) 135 MG CPDR Take 135 mg by mouth every evening.   Yes Historical Provider, MD  cyclobenzaprine (FLEXERIL) 10 MG tablet Take 1 tablet (10 mg total) by mouth 3 (three) times daily as needed for muscle spasms. 04/09/13  Yes Blanchie Dessert, MD  diclofenac sodium (VOLTAREN) 1 % GEL Apply 1 application topically 3 (three) times daily as needed (pain). For arthritis pain   Yes Historical Provider, MD  esomeprazole (NEXIUM) 40 MG capsule Take 40 mg by mouth daily before breakfast.   Yes Historical Provider, MD  glipiZIDE (GLUCOTROL) 10 MG tablet Take 10 mg by mouth daily.  04/29/15  Yes Historical Provider, MD  insulin aspart (NOVOLOG FLEXPEN) 100 UNIT/ML injection Inject 9-25 Units into the skin 3 (three) times daily before meals. For sugar per sliding scale   Yes Historical Provider, MD  insulin glargine (LANTUS) 100 UNIT/ML injection Inject 42 Units into the skin at bedtime.  02/20/11 07/02/15 Yes Fayrene Helper, MD  JANUVIA 100 MG tablet Take 100 mg by mouth daily. 05/06/15  Yes Historical Provider, MD  oxyCODONE (ROXICODONE) 5 MG/5ML solution Take 5-10 mLs by mouth every 6 (six) hours as needed for moderate pain or severe pain.  06/26/15  Yes Historical Provider, MD  oxyCODONE-acetaminophen (PERCOCET) 10-325 MG tablet Take 1 tablet by mouth every 6 (six) hours as needed for pain.  05/10/15  Yes Historical Provider, MD  phentermine 37.5 MG capsule Take 37.5 mg by mouth every morning.   Yes Historical Provider, MD  pravastatin (PRAVACHOL) 80 MG tablet Take 80 mg by mouth daily. 06/07/15  Yes Historical Provider, MD  PROAIR HFA 108 (90 BASE) MCG/ACT inhaler USE 2 PUFFS EVERY 6 TO 8 HOURS AS NEEDED FOR WHEEZING. Patient not taking: Reported on 06/10/2015 06/07/12   Fayrene Helper, MD   BP 124/51 mmHg  Pulse 83  Temp(Src) 98.1 F (36.7 C) (Oral)  Resp 16  Ht 5\' 3"  (1.6 m)  Wt 146.512 kg  BMI 57.23 kg/m2  SpO2 99% Physical Exam  Constitutional: She appears well-developed and well-nourished. No distress.  HENT:  Head: Normocephalic and atraumatic.  Nose: Nose normal.  Mouth/Throat: Uvula is midline, oropharynx is clear and moist and mucous membranes are normal.  Eyes: Pupils are equal, round, and reactive to light.  Neck: Normal range of motion. Neck supple.  Cardiovascular: Normal rate and regular rhythm.   Pulmonary/Chest: Effort normal.  Abdominal: Soft. Bowel sounds are normal. She exhibits no distension. There is no tenderness. There is no rigidity, no rebound, no guarding and no CVA tenderness.  Exam limited by body habitus. Her surgical incisions are  well healing, mild associated ecchymosis. Tenderness to palpation.  Musculoskeletal:  No LE swelling  Neurological: She is alert.  Acting at baseline  Skin: Skin is warm and dry. No rash noted.  Nursing note and vitals reviewed.   ED Course  Procedures (including critical care time) Labs Review Labs Reviewed  CBC WITH DIFFERENTIAL/PLATELET - Abnormal; Notable for the following:    WBC 11.0 (*)    All other components within normal limits  BASIC METABOLIC PANEL - Abnormal; Notable for the following:    Glucose, Bld 161 (*)    All other components within normal limits  URINALYSIS, ROUTINE W REFLEX MICROSCOPIC (NOT AT [facility]) - Abnormal; Notable for the following:    Color, Urine AMBER (*)    Specific Gravity, Urine 1.031 (*)    Bilirubin Urine LARGE (*)  Ketones, ur 40 (*)    Protein, ur 30 (*)    All other components within normal limits  URINE MICROSCOPIC-ADD ON - Abnormal; Notable for the following:    Squamous Epithelial / LPF 0-5 (*)    Bacteria, UA RARE (*)    All other components within normal limits    Imaging Review Ct Abdomen Pelvis W Contrast  07/03/2015  CLINICAL DATA:  Status post gastric bypass surgery June 24, 2015. Nausea, vomiting, abdominal pain. History of hypertension, diabetes, bladder surgery, hysterectomy. EXAM: CT ABDOMEN AND PELVIS WITH CONTRAST TECHNIQUE: Multidetector CT imaging of the abdomen and pelvis was performed using the standard protocol following bolus administration of intravenous contrast. CONTRAST:  152mL OMNIPAQUE IOHEXOL 300 MG/ML  SOLN COMPARISON:  Abdominal ultrasound March 28, 2015 FINDINGS: Large body habitus results in overall noisy image quality. LUNG BASES: Minimal atelectasis RIGHT lower lobe. Heart size is normal, no pericardial effusions. SOLID ORGANS: The liver, spleen, gallbladder, pancreas and adrenal glands are unremarkable. GASTROINTESTINAL TRACT: Status post Roux-en-Y gastric bypass surgery. Mild fluid distention of the  residual gastric cardia of the excluded stomach. Normal appearance of the gastric pouch and bowel anastomoses, limited by lack of enteric contrast. No bowel obstruction. Mild colonic diverticulosis. Normal appendix. KIDNEYS/ URINARY TRACT: Kidneys are orthotopic, demonstrating symmetric enhancement. No nephrolithiasis, hydronephrosis or solid renal masses. The unopacified ureters are normal in course and caliber. Delayed imaging through the kidneys demonstrates symmetric prompt contrast excretion within the proximal urinary collecting system. Urinary bladder is partially distended and unremarkable. PERITONEUM/RETROPERITONEUM: Mild mesenteric edema with small reactive lymph nodes compatible with recent surgery. Aortoiliac vessels are normal in course and caliber, mild calcific atherosclerosis. No lymphadenopathy by CT size criteria. Status post hysterectomy. Small amount of free fluid in the pelvis. SOFT TISSUE/OSSEOUS STRUCTURES: Large body habitus, LEFT anterior abdominal wall fat stranding is likely postsurgical. IMPRESSION: Status post recent Roux-en-Y gastric bypass, mild fluid distended gastric cardia of excluded stomach, if clinical concern for patch integrity, recommend upper GI/small bowel follow-through on a nonemergent basis. No bowel obstruction. Small amount of free fluid in the pelvis may be postoperative. Electronically Signed   By: Elon Alas M.D.   On: 07/03/2015 04:48   I have personally reviewed and evaluated these images and lab results as part of my medical decision-making.   EKG Interpretation   Date/Time:  Tuesday July 02 2015 21:49:20 EDT Ventricular Rate:  101 PR Interval:  118 QRS Duration: 98 QT Interval:  355 QTC Calculation: 460 R Axis:   31 Text Interpretation:  Sinus tachycardia Low voltage, precordial leads  Abnormal R-wave progression, early transition Baseline wander in lead(s)  III No significant change since last tracing Confirmed by LITTLE MD,  RACHEL  [id number]) on 07/03/2015 5:08:55 AM      MDM   Final diagnoses:  None    Medications  sodium chloride 0.9 % bolus 1,000 mL (1,000 mLs Intravenous New Bag/Given 07/03/15 0319)  ondansetron (ZOFRAN) injection 4 mg (4 mg Intravenous Given 07/03/15 0318)  morphine 4 MG/ML injection 4 mg (4 mg Intravenous Given 07/03/15 0318)  morphine 4 MG/ML injection 4 mg (4 mg Intravenous Given 07/03/15 0436)  iohexol (OMNIPAQUE) 300 MG/ML solution 100 mL (100 mLs Intravenous Contrast Given 07/03/15 0418)   Will get CBC, BMP and urinalysis. Will obtain CT scan of abd/pelv  Patients urinalysis shows sings of dehydration with 40 ketones. CBC is mildly elevated at 11 and BMP is also unremarkable. CT scan shows, IMPRESSION: Status post recent Roux-en-Y gastric bypass,  mild fluid distended gastric cardia of excluded stomach, if clinical concern for patch integrity, recommend upper GI/small bowel follow-through on a nonemergent basis. No bowel obstruction. Small amount of free fluid in the pelvis may be postoperative.  5: 12 am Discussed results and patient with Dr. Rex Kras. Plan is to touch base with surgeon for further recommendations.  5: 35 am Dr. Rosendo Gros with General Surgery will review CT scans and plans to come see patient in the ED. At end of shift patient sign out to Shary Decamp, PA-C. Await recommendation of Gen Surg.  Delos Haring, PA-C 07/04/15 [place], MD 07/04/15 [phone]

## 2015-07-03 NOTE — Discharge Instructions (Signed)
Please read and follow all provided instructions.  Your diagnoses today include:  1. History of Roux-en-Y gastric bypass   2. Generalized abdominal pain   3. Non-intractable vomiting with nausea, vomiting of unspecified type    Tests performed today include:  Vital signs. See below for your results today.   Medications prescribed:   Take any medication as prescribed   Home care instructions:  Follow any educational materials contained in this packet. Stay away from yogurt. Drink lots of fluids and drink clear liquids until symptoms resolve.    Follow-up instructions: Please follow-up with your primary care provider  for further evaluation of symptoms and treatment   Return instructions:   Please return to the Emergency Department if you do not get better, if you get worse, or new symptoms OR  - Fever (temperature greater than 101.58F)  - Bleeding that does not stop with holding pressure to the area    -Severe pain (please note that you may be more sore the day after your accident)  - Chest Pain  - Difficulty breathing  - Severe nausea or vomiting  - Inability to tolerate food and liquids  - Passing out  - Skin becoming red around your wounds  - Change in mental status (confusion or lethargy)  - New numbness or weakness     Please return if you have any other emergent concerns.  Additional Information:  Your vital signs today were: BP 124/51 mmHg   Pulse 83   Temp(Src) 98.1 F (36.7 C) (Oral)   Resp 16   Ht 5\' 3"  (1.6 m)   Wt 146.512 kg   BMI 57.23 kg/m2   SpO2 99% If your blood pressure (BP) was elevated above 135/85 this visit, please have this repeated by your doctor within one month. ---------------

## 2015-07-03 NOTE — Progress Notes (Signed)
Pt s/p gastric bypass with abdominal pain s/p n/v since Monday after eating yogurt.  CT with no acute findings.  Min WBC.  Abd pain appears to have been related to n/v.  Nausea has resolved at this time.  I have asked the pt to stay away from yogurt at this time and cont with PO hydration and Clears.  I will have Dr. Excell Seltzer review CT scan.  [place] for DC home

## 2015-07-04 ENCOUNTER — Telehealth (HOSPITAL_COMMUNITY): Payer: Self-pay

## 2015-07-04 NOTE — Telephone Encounter (Signed)
Made discharge phone call to patient per DROP protocol. Asking the following questions.    1. Do you have someone to care for you now that you are home?  yes 2. Are you having pain now that is not relieved by your pain medication?  yes 3. Are you able to drink the recommended daily amount of fluids (48 ounces minimum/day) and protein (60-80 grams/day) as prescribed by the dietitian or nutritional counselor?  No, came back to the hospital 4. Are you taking the vitamins and minerals as prescribed?  yes 5. Do you have the "on call" number to contact your surgeon if you have a problem or question?  yes 6. Are your incisions free of redness, swelling or drainage? (If steri strips, address that these can fall off, shower as tolerated) yes 7. Have your bowels moved since your surgery?  If not, are you passing gas?  yes 8. Are you up and walking 3-4 times per day?  yes

## 2015-07-09 ENCOUNTER — Encounter: Payer: Medicare Other | Attending: General Surgery

## 2015-07-09 DIAGNOSIS — Z713 Dietary counseling and surveillance: Secondary | ICD-10-CM | POA: Insufficient documentation

## 2015-07-09 DIAGNOSIS — E669 Obesity, unspecified: Secondary | ICD-10-CM | POA: Diagnosis not present

## 2015-07-09 DIAGNOSIS — Z6841 Body Mass Index (BMI) 40.0 and over, adult: Secondary | ICD-10-CM | POA: Insufficient documentation

## 2015-07-10 NOTE — Progress Notes (Signed)
  Bariatric Class:  Appt start time: 1530 end time:  1630.  2 Week Post-Operative Nutrition Class  Patient was seen on 07/09/15 for Post-Operative Nutrition education at [facility].   Surgery date: 06/24/2015 Surgery type: RYGB Start weight at [facility]: 328.5 lbs on 04/10/15 Weight today: 302 lbs  Weight change: 23.1 lbs  TANITA  BODY COMP RESULTS  06/10/15 07/09/15   BMI (kg/m^2) N/A 53.5   Fat Mass (lbs)  161.5   Fat Free Mass (lbs)  140.5   Total Body Water (lbs)  103.0    The following the learning objectives were met by the patient during this course:  Identifies Phase 3A (Soft, High Proteins) Dietary Goals and will begin from 2 weeks post-operatively to 2 months post-operatively  Identifies appropriate sources of fluids and proteins   States protein recommendations and appropriate sources post-operatively  Identifies the need for appropriate texture modifications, mastication, and bite sizes when consuming solids  Identifies appropriate multivitamin and calcium sources post-operatively  Describes the need for physical activity post-operatively and will follow MD recommendations  States when to call healthcare provider regarding medication questions or post-operative complications  Handouts given during class include:  Phase 3A: Soft, High Protein Diet Handout  Follow-Up Plan: Patient will follow-up at [facility] in 6 weeks for 2 month post-op nutrition visit for diet advancement per MD.

## 2015-07-17 ENCOUNTER — Emergency Department (HOSPITAL_COMMUNITY): Payer: Medicare Other

## 2015-07-17 ENCOUNTER — Encounter (HOSPITAL_COMMUNITY): Payer: Self-pay | Admitting: Emergency Medicine

## 2015-07-17 ENCOUNTER — Emergency Department (HOSPITAL_COMMUNITY)
Admission: EM | Admit: 2015-07-17 | Discharge: 2015-07-17 | Disposition: A | Discharge status: Home / Self Care | Payer: Medicare Other | Attending: Emergency Medicine | Admitting: Emergency Medicine

## 2015-07-17 DIAGNOSIS — G4733 Obstructive sleep apnea (adult) (pediatric): Secondary | ICD-10-CM | POA: Insufficient documentation

## 2015-07-17 DIAGNOSIS — Z9884 Bariatric surgery status: Secondary | ICD-10-CM | POA: Diagnosis not present

## 2015-07-17 DIAGNOSIS — I10 Essential (primary) hypertension: Secondary | ICD-10-CM | POA: Diagnosis not present

## 2015-07-17 DIAGNOSIS — Z79899 Other long term (current) drug therapy: Secondary | ICD-10-CM | POA: Insufficient documentation

## 2015-07-17 DIAGNOSIS — Z794 Long term (current) use of insulin: Secondary | ICD-10-CM | POA: Insufficient documentation

## 2015-07-17 DIAGNOSIS — R112 Nausea with vomiting, unspecified: Secondary | ICD-10-CM | POA: Insufficient documentation

## 2015-07-17 DIAGNOSIS — M25561 Pain in right knee: Secondary | ICD-10-CM | POA: Insufficient documentation

## 2015-07-17 DIAGNOSIS — K219 Gastro-esophageal reflux disease without esophagitis: Secondary | ICD-10-CM | POA: Diagnosis not present

## 2015-07-17 DIAGNOSIS — E119 Type 2 diabetes mellitus without complications: Secondary | ICD-10-CM | POA: Diagnosis not present

## 2015-07-17 DIAGNOSIS — E785 Hyperlipidemia, unspecified: Secondary | ICD-10-CM | POA: Insufficient documentation

## 2015-07-17 DIAGNOSIS — Z9981 Dependence on supplemental oxygen: Secondary | ICD-10-CM | POA: Insufficient documentation

## 2015-07-17 DIAGNOSIS — M1990 Unspecified osteoarthritis, unspecified site: Secondary | ICD-10-CM | POA: Diagnosis not present

## 2015-07-17 DIAGNOSIS — F329 Major depressive disorder, single episode, unspecified: Secondary | ICD-10-CM | POA: Insufficient documentation

## 2015-07-17 DIAGNOSIS — J45909 Unspecified asthma, uncomplicated: Secondary | ICD-10-CM | POA: Diagnosis not present

## 2015-07-17 DIAGNOSIS — Z7984 Long term (current) use of oral hypoglycemic drugs: Secondary | ICD-10-CM | POA: Diagnosis not present

## 2015-07-17 DIAGNOSIS — J069 Acute upper respiratory infection, unspecified: Secondary | ICD-10-CM | POA: Insufficient documentation

## 2015-07-17 DIAGNOSIS — M25562 Pain in left knee: Secondary | ICD-10-CM | POA: Insufficient documentation

## 2015-07-17 DIAGNOSIS — G8929 Other chronic pain: Secondary | ICD-10-CM | POA: Diagnosis not present

## 2015-07-17 DIAGNOSIS — R05 Cough: Secondary | ICD-10-CM | POA: Diagnosis present

## 2015-07-17 LAB — COMPREHENSIVE METABOLIC PANEL
ALK PHOS: 39 U/L (ref 38–126)
ALT: 25 U/L (ref 14–54)
ANION GAP: 11 (ref 5–15)
AST: 31 U/L (ref 15–41)
Albumin: 3.2 g/dL — ABNORMAL LOW (ref 3.5–5.0)
BUN: <5 mg/dL — ABNORMAL LOW (ref 6–20)
CALCIUM: 8.8 mg/dL — AB (ref 8.9–10.3)
CO2: 24 mmol/L (ref 22–32)
CREATININE: 0.75 mg/dL (ref 0.44–1.00)
Chloride: 104 mmol/L (ref 101–111)
Glucose, Bld: 181 mg/dL — ABNORMAL HIGH (ref 65–99)
Potassium: 3.5 mmol/L (ref 3.5–5.1)
Sodium: 139 mmol/L (ref 135–145)
TOTAL PROTEIN: 6.7 g/dL (ref 6.5–8.1)
Total Bilirubin: 0.5 mg/dL (ref 0.3–1.2)

## 2015-07-17 LAB — CBC
HCT: 41 % (ref 36.0–46.0)
Hemoglobin: 12.9 g/dL (ref 12.0–15.0)
MCH: 27.6 pg (ref 26.0–34.0)
MCHC: 31.5 g/dL (ref 30.0–36.0)
MCV: 87.6 fL (ref 78.0–100.0)
PLATELETS: 184 10*3/uL (ref 150–400)
RBC: 4.68 MIL/uL (ref 3.87–5.11)
RDW: 13.4 % (ref 11.5–15.5)
WBC: 3.8 10*3/uL — AB (ref 4.0–10.5)

## 2015-07-17 LAB — LIPASE, BLOOD: Lipase: 27 U/L (ref 11–51)

## 2015-07-17 MED ORDER — BENZONATATE 100 MG PO CAPS
100.0000 mg | ORAL_CAPSULE | Freq: Once | ORAL | Status: AC
  Administered 2015-07-17: 100 mg via ORAL
  Filled 2015-07-17: qty 1

## 2015-07-17 MED ORDER — BENZONATATE 100 MG PO CAPS: 100.0000 mg | ORAL_CAPSULE | Freq: Three times a day (TID) | ORAL | Status: DC

## 2015-07-17 MED ORDER — HYDROCOD POLST-CPM POLST ER 10-8 MG/5ML PO SUER: 5.0000 mL | Freq: Every evening | ORAL | Status: DC | PRN

## 2015-07-17 MED ORDER — KETOROLAC TROMETHAMINE 30 MG/ML IJ SOLN
30.0000 mg | Freq: Once | INTRAMUSCULAR | Status: AC
  Administered 2015-07-17: 30 mg via INTRAMUSCULAR
  Filled 2015-07-17: qty 1

## 2015-07-17 NOTE — Discharge Instructions (Signed)
1. Medications: tessalon, tussionex, usual home medications 2. Treatment: rest, drink plenty of fluids; try warm honey, tea, and throat lozenges for additional symptom relief 3. Follow Up: please followup with your primary doctor for discussion of your diagnoses and further evaluation after today's visit; please return to the ER for high fever, shortness of breath, new or worsening symptoms   Upper Respiratory Infection, Adult Most upper respiratory infections (URIs) are caused by a virus. A URI affects the nose, throat, and upper air passages. The most common type of URI is often called "the common cold." HOME CARE   Take medicines only as told by your doctor.  Gargle warm saltwater or take cough drops to comfort your throat as told by your doctor.  Use a warm mist humidifier or inhale steam from a shower to increase air moisture. This may make it easier to breathe.  Drink enough fluid to keep your pee (urine) clear or pale yellow.  Eat soups and other clear broths.  Have a healthy diet.  Rest as needed.  Go back to work when your fever is gone or your doctor says it is okay.  You may need to stay home longer to avoid giving your URI to others.  You can also wear a face mask and wash your hands often to prevent spread of the virus.  Use your inhaler more if you have asthma.  Do not use any tobacco products, including cigarettes, chewing tobacco, or electronic cigarettes. If you need help quitting, ask your doctor. GET HELP IF:  You are getting worse, not better.  Your symptoms are not helped by medicine.  You have chills.  You are getting more short of breath.  You have brown or red mucus.  You have yellow or brown discharge from your nose.  You have pain in your face, especially when you bend forward.  You have a fever.  You have puffy (swollen) neck glands.  You have pain while swallowing.  You have white areas in the back of your throat. GET HELP RIGHT AWAY  IF:   You have very bad or constant:  Headache.  Ear pain.  Pain in your forehead, behind your eyes, and over your cheekbones (sinus pain).  Chest pain.  You have long-lasting (chronic) lung disease and any of the following:  Wheezing.  Long-lasting cough.  Coughing up blood.  A change in your usual mucus.  You have a stiff neck.  You have changes in your:  Vision.  Hearing.  Thinking.  Mood. MAKE SURE YOU:   Understand these instructions.  Will watch your condition.  Will get help right away if you are not doing well or get worse.   This information is not intended to replace advice given to you by your health care provider. Make sure you discuss any questions you have with your health care provider.   Document Released: 09/23/2007 Document Revised: 08/21/2014 Document Reviewed: 07/12/2013 Elsevier Interactive Patient Education 2016 [employer]  Knee Pain Knee pain is a common problem. It can have many causes. The pain often goes away by following your doctor's home care instructions. Treatment for ongoing pain will depend on the cause of your pain. If your knee pain continues, more tests may be needed to diagnose your condition. Tests may include X-rays or other imaging studies of your knee. HOME CARE  Take medicines only as told by your doctor.  Rest your knee and keep it raised (elevated) while you are resting.  Do not  do things that cause pain or make your pain worse.  Avoid activities where both feet leave the ground at the same time, such as running, jumping rope, or doing jumping jacks.  Apply ice to the knee area:  Put ice in a plastic bag.  Place a towel between your skin and the bag.  Leave the ice on for 20 minutes, 2-3 times a day.  Ask your doctor if you should wear an elastic knee support.  Sleep with a pillow under your knee.  Lose weight if you are overweight. Being overweight can make your knee hurt more.  Do not use any  tobacco products, including cigarettes, chewing tobacco, or electronic cigarettes. If you need help quitting, ask your doctor. Smoking may slow the healing of any bone and joint problems that you may have. GET HELP IF:  Your knee pain does not stop, it changes, or it gets worse.  You have a fever along with knee pain.  Your knee gives out or locks up.  Your knee becomes more swollen. GET HELP RIGHT AWAY IF:   Your knee feels hot to the touch.  You have chest pain or trouble breathing.   This information is not intended to replace advice given to you by your health care provider. Make sure you discuss any questions you have with your health care provider.   Document Released: 07/03/2008 Document Revised: 04/27/2014 Document Reviewed: 06/07/2013 Elsevier Interactive Patient Education [employer].

## 2015-07-17 NOTE — ED Provider Notes (Signed)
CSN: [id number]     Arrival date & time 07/17/15  1503 History  By signing my name below, I, Altamease Oiler, attest that this documentation has been prepared under the direction and in the presence of [employer]. Electronically Signed: Altamease Oiler, ED Scribe. 07/17/2015 5:31 PM   Chief Complaint  Patient presents with  . Cough     The history is provided by the patient. No language interpreter was used.    Lisa Todd is a 60 y.o. female with history of asthma, DM, HLD, arthritis, and obesity s/p laparoscopic gastric bypass 23 days ago who presents to the Emergency Department complaining of a productive cough with onset more than 1 week ago. Associated symptoms include chills, chest wall soreness, nausea, and emesis (last episode 2 days ago). Pt states that her symptoms started after caring for a relative with pneumonia.  She also complains of worsened bilateral knee pain after a fall on 06/29/15. She fell backwards to the buttocks but states that the knee pain started when she tried to get up. This pain is worse than the chronic knee pain that she has due to arthritis and she is having difficulty bearing weight. Pt denies numbness, tingling, weakness, fever, and abdominal pain.   Past Medical History  Diagnosis Date  . Chronic back pain   . Hyperlipidemia   . Asthma   . Depression   . Obesity   . Hypertension   . IDDM (insulin dependent diabetes mellitus) ([place])   . Diabetes mellitus   . Arthritis   . Sleep apnea     uses O2 2 liters  / currently has no cpap   . GERD (gastroesophageal reflux disease)    Past Surgical History  Procedure Laterality Date  . Vesicovaginal fistula closure w/ tah  2002  . Knee left arthroscopy  2004  . Right knee surgery  2007  . Bladder repair w/ cesarean section  1989  . Right rotator cuff surgery  11/26/2009  . Abdominal hysterectomy    . Gastric roux-en-y N/A 06/24/2015    Procedure: LAPAROSCOPIC ROUX-EN-Y GASTRIC BYPASS WITH  UPPER ENDOSCOPY;  Surgeon: Excell Seltzer, MD;  Location: [facility];  Service: General;  Laterality: N/A;   Family History  Problem Relation Age of Onset  . Breast cancer Mother   . Diabetes Mother   . Hypertension Mother   . Asthma Father   . Diabetes Father   . Hypertension Father   . Diabetes Brother   . Hypertension Brother   . Diabetes Sister   . Hypertension Sister    Social History  Substance Use Topics  . Smoking status: Never Smoker   . Smokeless tobacco: None  . Alcohol Use: No   OB History    No data available      Review of Systems  Constitutional: Positive for chills. Negative for fever.  Respiratory: Positive for cough.   Gastrointestinal: Positive for nausea and vomiting. Negative for abdominal pain.  Musculoskeletal: Positive for arthralgias.  Neurological: Negative for weakness and numbness.    Allergies  Liraglutide  Home Medications   Prior to Admission medications   Medication Sig Start Date End Date Taking? Authorizing Provider  albuterol (PROVENTIL HFA;VENTOLIN HFA) 108 (90 BASE) MCG/ACT inhaler Inhale 1-2 puffs into the lungs every 6 (six) hours as needed for wheezing. 08/05/12   Prentiss Bells, MD  ALPRAZolam Duanne Moron) 1 MG tablet Take 1 mg by mouth 4 (four) times daily as needed for anxiety or sleep.  09/19/13   Historical Provider, MD  benzonatate (TESSALON) 100 MG capsule Take 1 capsule (100 mg total) by mouth every 8 (eight) hours. 07/17/15   Marella Chimes, PA-C  CARDIZEM LA 360 MG 24 hr tablet Take 360 mg by mouth daily.  05/06/15   Historical Provider, MD  chlorpheniramine-HYDROcodone (TUSSIONEX PENNKINETIC ER) 10-8 MG/5ML SUER Take 5 mLs by mouth at bedtime as needed for cough. 07/17/15   Marella Chimes, PA-C  Choline Fenofibrate (FENOFIBRIC ACID) 135 MG CPDR Take 135 mg by mouth every evening.    Historical Provider, MD  cyclobenzaprine (FLEXERIL) 10 MG tablet Take 1 tablet (10 mg total) by mouth 3 (three) times daily as needed for  muscle spasms. 04/09/13   Blanchie Dessert, MD  diclofenac sodium (VOLTAREN) 1 % GEL Apply 1 application topically 3 (three) times daily as needed (pain). For arthritis pain    Historical Provider, MD  esomeprazole (NEXIUM) 40 MG capsule Take 40 mg by mouth daily before breakfast.    Historical Provider, MD  glipiZIDE (GLUCOTROL) 10 MG tablet Take 10 mg by mouth daily. 04/29/15   Historical Provider, MD  insulin aspart (NOVOLOG FLEXPEN) 100 UNIT/ML injection Inject 9-25 Units into the skin 3 (three) times daily before meals. For sugar per sliding scale    Historical Provider, MD  insulin glargine (LANTUS) 100 UNIT/ML injection Inject 42 Units into the skin at bedtime.  02/20/11 07/02/15  Fayrene Helper, MD  JANUVIA 100 MG tablet Take 100 mg by mouth daily. 05/06/15   Historical Provider, MD  ondansetron (ZOFRAN) 4 MG tablet Take 1 tablet (4 mg total) by mouth every 6 (six) hours. 07/03/15   Shary Decamp, PA-C  oxyCODONE (ROXICODONE) 5 MG/5ML solution Take 5-10 mLs by mouth every 6 (six) hours as needed for moderate pain or severe pain.  06/26/15   Historical Provider, MD  oxyCODONE-acetaminophen (PERCOCET) 10-325 MG tablet Take 1 tablet by mouth every 6 (six) hours as needed for pain.  05/10/15   Historical Provider, MD  phentermine 37.5 MG capsule Take 37.5 mg by mouth every morning.    Historical Provider, MD  pravastatin (PRAVACHOL) 80 MG tablet Take 80 mg by mouth daily. 06/07/15   Historical Provider, MD  PROAIR HFA 108 (90 BASE) MCG/ACT inhaler USE 2 PUFFS EVERY 6 TO 8 HOURS AS NEEDED FOR WHEEZING. Patient not taking: Reported on 06/10/2015 06/07/12   Fayrene Helper, MD    BP 129/82 mmHg  Pulse 79  Temp(Src) 98.3 F (36.8 C) (Oral)  Resp 21  SpO2 100% Physical Exam  Constitutional: She is oriented to person, place, and time. She appears well-developed and well-nourished. No distress.  HENT:  Head: Normocephalic and atraumatic.  Right Ear: External ear normal.  Left Ear: External ear  normal.  Nose: Nose normal.  Mouth/Throat: Uvula is midline, oropharynx is clear and moist and mucous membranes are normal. No oropharyngeal exudate.  Eyes: Conjunctivae, EOM and lids are normal. Pupils are equal, round, and reactive to light. Right eye exhibits no discharge. Left eye exhibits no discharge. No scleral icterus.  Neck: Normal range of motion. Neck supple.  Cardiovascular: Normal rate, regular rhythm, normal heart sounds, intact distal pulses and normal pulses.   Pulmonary/Chest: Effort normal and breath sounds normal. No respiratory distress. She has no wheezes. She has no rales.  Abdominal: Soft. Normal appearance and bowel sounds are normal. She exhibits no distension and no mass. There is no tenderness. There is no rigidity, no rebound and no guarding.  Musculoskeletal: She exhibits tenderness. She exhibits no edema.       Legs: Diffuse TTP to knees bilaterally with decreased ROM due to pain. No TTP to popliteal space. Patient able to straight leg raise lower extremities bilaterally. No MCL or LCL laxity. No significant edema, erythema, or heat.   Neurological: She is alert and oriented to person, place, and time. She has normal strength. No sensory deficit.  Skin: Skin is warm, dry and intact. No rash noted. She is not diaphoretic. No erythema. No pallor.  Psychiatric: She has a normal mood and affect. Her speech is normal and behavior is normal.  Nursing note and vitals reviewed.   ED Course  Procedures (including critical care time)  DIAGNOSTIC STUDIES: Oxygen Saturation is 100% on RA,  normal by my interpretation.    COORDINATION OF CARE: 5:27 PM Discussed treatment plan which includes lab work, CXR, bilateral knee XRs, Tessalon, and Toradol  with pt at bedside and pt agreed to plan.  Labs Review Labs Reviewed  COMPREHENSIVE METABOLIC PANEL - Abnormal; Notable for the following:    Glucose, Bld 181 (*)    BUN <5 (*)    Calcium 8.8 (*)    Albumin 3.2 (*)    All  other components within normal limits  CBC - Abnormal; Notable for the following:    WBC 3.8 (*)    All other components within normal limits  LIPASE, BLOOD    Imaging Review Dg Chest 2 View  07/17/2015  CLINICAL DATA:  Cough and fever for 10 days. Bilateral lower extremity pain and swelling. EXAM: CHEST  2 VIEW COMPARISON:  01/08/2015 FINDINGS: The heart size and mediastinal contours are within normal limits. Both lungs are clear. The visualized skeletal structures are unremarkable. IMPRESSION: No active cardiopulmonary disease. Electronically Signed   By: Earle Gell M.D.   On: 07/17/2015 15:58   Dg Knee Complete 4 Views Left  07/17/2015  CLINICAL DATA:  Chronic bilateral knee pain.  No known injury. EXAM: LEFT KNEE - COMPLETE 4+ VIEW COMPARISON:  12/10/2011. FINDINGS: Severe tricompartmental osteoarthritis is again noted with bone-on-bone apposition and subchondral eburnation in the medial compartment. There are large tricompartmental osteophytes. No evidence of acute fracture, dislocation, definite joint effusion or loose body. IMPRESSION: No acute osseous findings. Advanced tricompartmental osteoarthritis. Electronically Signed   By: Richardean Sale M.D.   On: 07/17/2015 18:16   Dg Knee Complete 4 Views Right  07/17/2015  CLINICAL DATA:  Chronic bilateral knee pain.  No reported injury. EXAM: RIGHT KNEE - COMPLETE 4+ VIEW COMPARISON:  12/03/2014 right knee radiographs. FINDINGS: Trace suprapatellar right knee joint effusion, not appreciably changed. No fracture, acute malalignment or suspicious focal osseous lesion. Stable moderate to severe tricompartmental osteoarthritis, most prominent in the patellofemoral compartment. IMPRESSION: Stable moderate to severe tricompartmental right knee osteoarthritis with trace suprapatellar right knee joint effusion. No fracture or acute malalignment. Electronically Signed   By: Ilona Sorrel M.D.   On: 07/17/2015 18:13   I have personally reviewed and  evaluated these images and lab results as part of my medical decision-making.   EKG Interpretation None      MDM   Final diagnoses:  URI (upper respiratory infection)  Knee pain, bilateral    60 year old female presents with chills, nasal congestion, productive cough, nausea, and vomiting. States her symptoms started after taking care of her grandson, who was recently diagnosed with pneumonia. Also reports fall approximately 2 weeks ago. States she fell on her buttocks, though has had  worsening knee pain since that time (reports chronic knee pain 2/2 OA, though notes pain increased after she tried to get up from falling). Denies numbness, weakness, paresthesia, head injury, LOC, neck pain, back pain.  Patient is afebrile. Vital signs stable. Heart regular rate and rhythm. Lungs clear to auscultation bilaterally. Abdomen soft, nontender, nondistended. Mild diffuse tenderness to palpation to knees bilaterally. No TTP to popliteal space. No erythema, edema, or heat. Decreased range of motion due to pain. Patient is neurovascularly intact.  CBC negative for leukocytosis or anemia. CMP unremarkable. Lipase within normal limits. Chest x-ray negative for active cardiopulmonary disease. Will give anti-inflammatory, cough medicine, and obtain imaging of both knees.  Imaging of right knee remarkable for severe tricompartmental osteoarthritis with trace suprapatellar joint effusion. Imaging of left knee remarkable for advanced tricompartmental osteoarthritis. Discussed findings with patient. She is nontoxic and well-appearing, feel she is stable for discharge at this time. URI symptoms likely viral. Knee pain likely due to osteoarthritis. Doubt gout or septic joint. Will give cough medicine for home. Advised to increase fluid intake and to try warm honey, tea, and throat lozenges for additional symptom relief. Patient states she has pain medication at home and does not need any additional medicine. Patient  to follow up with PCP. Strict return precautions discussed. Patient verbalizes her understanding and is in agreement with plan.  BP 129/82 mmHg  Pulse 79  Temp(Src) 98.3 F (36.8 C) (Oral)  Resp 21  SpO2 100%  I personally performed the services described in this documentation, which was scribed in my presence. The recorded information has been reviewed and is accurate.     Marella Chimes, PA-C 07/17/15 1850  Sherwood Gambler, MD 07/23/15 1123

## 2015-07-17 NOTE — ED Notes (Signed)
Pt here for cough and fever x 10 days; pt sts feels like she may have PNA

## 2015-08-20 ENCOUNTER — Encounter: Payer: Self-pay | Admitting: Dietician

## 2015-08-20 ENCOUNTER — Encounter: Payer: Medicare Other | Attending: General Surgery | Admitting: Dietician

## 2015-08-20 DIAGNOSIS — E669 Obesity, unspecified: Secondary | ICD-10-CM | POA: Insufficient documentation

## 2015-08-20 DIAGNOSIS — Z6841 Body Mass Index (BMI) 40.0 and over, adult: Secondary | ICD-10-CM | POA: Diagnosis not present

## 2015-08-20 DIAGNOSIS — Z713 Dietary counseling and surveillance: Secondary | ICD-10-CM | POA: Diagnosis not present

## 2015-08-20 NOTE — Progress Notes (Signed)
  Follow-up visit:  8 Weeks Post-Operative RYGB Surgery  Medical Nutrition Therapy:  Appt start time: [id number] end time:  1045.  Primary concerns today: Post-operative Bariatric Surgery Nutrition Management. Returns having lost 16 lbs. Having knee/legs pain. Had cortisone shot today in both knees. Will have gel shots in a couple weeks. Has an aid to help her with bathing in the morning. Will have knee surgery when BMI is under 40. Going to water aerobics. Having 3 protein shakes per day. Not tolerating vegetables and fruit so not having them. Able to tolerate protein food.  Sometimes vomiting with meat though chews well. Not sure why she vomits, but may have too much water. Feeling frustrated that she is not losing weight faster. Explained to her that her weight loss is good and she is doing everything she needs to at this point.   Surgery date: 06/24/2015 Surgery type: RYGB Start weight at [facility]: 328.5 lbs on 04/10/15 Weight today: 286.0 lbs  Weight change: 16 lbs Total weight loss: 42.5 lbs  TANITA  BODY COMP RESULTS  06/10/15 07/09/15 08/20/15   BMI (kg/m^2) N/A 53.5 50.7   Fat Mass (lbs)  161.5 148.5   Fat Free Mass (lbs)  140.5 137.5   Total Body Water (lbs)  103.0 100.5    Preferred Learning Style:   No preference indicated   Learning Readiness:   Ready  24-hr recall: B (AM): Premier shake (30 g) Snk (AM): none L (PM): Premier shake or 2 oz tuna (14-30 g) Snk (PM): none  D (PM): 1-2 oz chicken salad  Snk (PM): jello or Premier shake (30 g)  Fluid intake: water, 33 oz protein shake, 68 oz water or more Estimated total protein intake: at least 90 grams  Medications: see list  Supplementation: taking, B12 shot   CBG monitoring: 4 x day  Average CBG per patient: 100-300 mg/dl Last patient reported A1c: high 9's in March per patient  Using straws: No Drinking while eating: No Hair loss: No Carbonated beverages: No N/V/D/C: vomits 1 x week with some meats  Dumping  syndrome: No   Recent physical activity:  Water aerobics 5 x week for 60 minutes, cardio 5 x week most day (not the past 2 weeks d/t pain)  Progress Towards Goal(s):  In progress.  Handouts given during visit include:  Phase 3 B High Protein and Non Starchy Vegetables   Nutritional Diagnosis:  [place]-3.3 Overweight/obesity related to past poor dietary habits and physical inactivity as evidenced by patient w/ recent RYGB surgery following dietary guidelines for continued weight loss.     Intervention:  Nutrition counseling provided. Goals:  Follow Phase 3B: High Protein + Non-Starchy Vegetables  Eat 3-6 small meals/snacks, every 3-5 hrs  Increase lean protein foods to meet 60g goal  Eat protein first then vegetables  Increase fluid intake to 64oz +  Avoid drinking 15 minutes before, during and 30 minutes after eating  Aim for >30 min of physical activity daily  Have no more than 2 protein shakes per day  Weigh and take your picture 1 x week  Teaching Method Utilized:  Visual Auditory Hands on  Barriers to learning/adherence to lifestyle change: none  Demonstrated degree of understanding via:  Teach Back   Monitoring/Evaluation:  Dietary intake, exercise, and body weight. Follow up in 1 months for 3 month post-op visit.

## 2015-08-20 NOTE — Patient Instructions (Addendum)
Goals:  Follow Phase 3B: High Protein + Non-Starchy Vegetables  Eat 3-6 small meals/snacks, every 3-5 hrs  Increase lean protein foods to meet 60g goal  Eat protein first then vegetables  Increase fluid intake to 64oz +  Avoid drinking 15 minutes before, during and 30 minutes after eating  Aim for >30 min of physical activity daily  Have no more than 2 protein shakes per day  Weigh and take your picture 1 x week  Surgery date: 06/24/2015 Surgery type: RYGB Start weight at [facility]: 328.5 lbs on 04/10/15 Weight today: 286.0 lbs  Weight change: 16 lbs Total weight loss: 42.5 lbs  TANITA  BODY COMP RESULTS  06/10/15 07/09/15 08/20/15   BMI (kg/m^2) N/A 53.5 50.7   Fat Mass (lbs)  161.5 148.5   Fat Free Mass (lbs)  140.5 137.5   Total Body Water (lbs)  103.0 100.5

## 2015-09-23 ENCOUNTER — Ambulatory Visit: Payer: Self-pay | Admitting: Dietician

## 2015-09-24 ENCOUNTER — Ambulatory Visit: Payer: Self-pay | Admitting: Dietician

## 2015-11-12 ENCOUNTER — Ambulatory Visit (HOSPITAL_COMMUNITY): Payer: Medicare Other | Attending: Family Medicine | Admitting: Physical Therapy

## 2015-11-12 ENCOUNTER — Encounter (HOSPITAL_COMMUNITY): Payer: Self-pay | Admitting: Physical Therapy

## 2015-11-12 DIAGNOSIS — M25562 Pain in left knee: Secondary | ICD-10-CM | POA: Diagnosis present

## 2015-11-12 DIAGNOSIS — M25662 Stiffness of left knee, not elsewhere classified: Secondary | ICD-10-CM | POA: Diagnosis present

## 2015-11-12 DIAGNOSIS — R2689 Other abnormalities of gait and mobility: Secondary | ICD-10-CM | POA: Diagnosis present

## 2015-11-12 DIAGNOSIS — R600 Localized edema: Secondary | ICD-10-CM

## 2015-11-12 DIAGNOSIS — M25661 Stiffness of right knee, not elsewhere classified: Secondary | ICD-10-CM | POA: Insufficient documentation

## 2015-11-12 DIAGNOSIS — M25561 Pain in right knee: Secondary | ICD-10-CM | POA: Insufficient documentation

## 2015-11-12 DIAGNOSIS — M6281 Muscle weakness (generalized): Secondary | ICD-10-CM | POA: Diagnosis present

## 2015-11-12 NOTE — Patient Instructions (Signed)
BRIDGING  While lying on your back, tighten your lower abdominals, squeeze your buttocks and then raise your buttocks off the floor/bed as creating a "Bridge" with your body. Hold and then lower yourself and repeat.  2x10 reps      Seated SL hamstring stretch  Seated on a flat surface keep one leg straight. For a greater stretch pull your toes toward your face.  Hold 30 sec, repeat 3x.      Stairs  Start at the base of the stairs. Step starting with strong leg and try to ascend reciprocally (one foot on each step). AT first the railing may be used, but try to progress without using a railing as you develop more control.  Lead with right leg going up and lead with left leg when going down.
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
## 2015-11-14 ENCOUNTER — Ambulatory Visit (HOSPITAL_COMMUNITY): Payer: Medicare Other | Admitting: Physical Therapy

## 2015-11-20 ENCOUNTER — Ambulatory Visit (HOSPITAL_COMMUNITY): Payer: Medicare Other | Attending: Family Medicine

## 2015-11-20 ENCOUNTER — Telehealth (HOSPITAL_COMMUNITY): Payer: Self-pay

## 2015-11-20 DIAGNOSIS — R2689 Other abnormalities of gait and mobility: Secondary | ICD-10-CM | POA: Insufficient documentation

## 2015-11-20 DIAGNOSIS — M25661 Stiffness of right knee, not elsewhere classified: Secondary | ICD-10-CM | POA: Insufficient documentation

## 2015-11-20 DIAGNOSIS — M25662 Stiffness of left knee, not elsewhere classified: Secondary | ICD-10-CM | POA: Insufficient documentation

## 2015-11-20 DIAGNOSIS — M6281 Muscle weakness (generalized): Secondary | ICD-10-CM | POA: Insufficient documentation

## 2015-11-20 DIAGNOSIS — M25561 Pain in right knee: Secondary | ICD-10-CM | POA: Insufficient documentation

## 2015-11-20 DIAGNOSIS — M25562 Pain in left knee: Secondary | ICD-10-CM | POA: Insufficient documentation

## 2015-11-20 DIAGNOSIS — R600 Localized edema: Secondary | ICD-10-CM | POA: Insufficient documentation

## 2015-11-20 NOTE — Telephone Encounter (Signed)
No show, attempted to call no answer, left message informing missed apt, included next apt date and time with contact info given.  [address], [place]; [facility] [phone]

## 2015-11-27 ENCOUNTER — Ambulatory Visit (HOSPITAL_COMMUNITY): Payer: Medicare Other

## 2015-11-27 DIAGNOSIS — M25561 Pain in right knee: Secondary | ICD-10-CM | POA: Diagnosis not present

## 2015-11-27 DIAGNOSIS — M25661 Stiffness of right knee, not elsewhere classified: Secondary | ICD-10-CM | POA: Diagnosis present

## 2015-11-27 DIAGNOSIS — R2689 Other abnormalities of gait and mobility: Secondary | ICD-10-CM

## 2015-11-27 DIAGNOSIS — R600 Localized edema: Secondary | ICD-10-CM

## 2015-11-27 DIAGNOSIS — M6281 Muscle weakness (generalized): Secondary | ICD-10-CM | POA: Diagnosis present

## 2015-11-27 DIAGNOSIS — M25562 Pain in left knee: Secondary | ICD-10-CM

## 2015-11-27 DIAGNOSIS — M25662 Stiffness of left knee, not elsewhere classified: Secondary | ICD-10-CM
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
## 2015-12-04 ENCOUNTER — Encounter (HOSPITAL_COMMUNITY): Payer: Self-pay

## 2015-12-04 ENCOUNTER — Ambulatory Visit (HOSPITAL_COMMUNITY): Payer: Medicare Other | Admitting: Physical Therapy

## 2015-12-04 ENCOUNTER — Telehealth (HOSPITAL_COMMUNITY): Payer: Self-pay

## 2015-12-04 NOTE — Telephone Encounter (Signed)
Patient came in for therapy and decieded she was in to much pain.

## 2015-12-11 ENCOUNTER — Telehealth (HOSPITAL_COMMUNITY): Payer: Self-pay

## 2015-12-11 ENCOUNTER — Ambulatory Visit (HOSPITAL_COMMUNITY): Payer: Medicare Other | Admitting: Physical Therapy

## 2015-12-11 NOTE — Telephone Encounter (Signed)
Pt said to cx all appts - therapy isn't doing her any good at this point

## 2015-12-18 ENCOUNTER — Encounter (HOSPITAL_COMMUNITY): Payer: Self-pay | Admitting: Physical Therapy

## 2015-12-25 ENCOUNTER — Encounter (HOSPITAL_COMMUNITY): Payer: Self-pay | Admitting: Physical Therapy

## 2016-01-01 ENCOUNTER — Encounter (HOSPITAL_COMMUNITY): Payer: Self-pay | Admitting: Physical Therapy

## 2016-01-08 ENCOUNTER — Encounter (HOSPITAL_COMMUNITY): Payer: Self-pay | Admitting: Physical Therapy

## 2016-01-15 ENCOUNTER — Encounter (HOSPITAL_COMMUNITY): Payer: Self-pay | Admitting: Physical Therapy

## 2016-01-22 ENCOUNTER — Emergency Department (HOSPITAL_COMMUNITY)
Admission: EM | Admit: 2016-01-22 | Discharge: 2016-01-22 | Disposition: A | Discharge status: Home / Self Care | Payer: Medicare Other | Attending: Emergency Medicine | Admitting: Emergency Medicine

## 2016-01-22 ENCOUNTER — Encounter (HOSPITAL_COMMUNITY): Payer: Self-pay | Admitting: Emergency Medicine

## 2016-01-22 DIAGNOSIS — R319 Hematuria, unspecified: Secondary | ICD-10-CM

## 2016-01-22 DIAGNOSIS — E119 Type 2 diabetes mellitus without complications: Secondary | ICD-10-CM | POA: Insufficient documentation

## 2016-01-22 DIAGNOSIS — I10 Essential (primary) hypertension: Secondary | ICD-10-CM | POA: Insufficient documentation

## 2016-01-22 DIAGNOSIS — Z794 Long term (current) use of insulin: Secondary | ICD-10-CM | POA: Diagnosis not present

## 2016-01-22 DIAGNOSIS — N390 Urinary tract infection, site not specified: Secondary | ICD-10-CM | POA: Diagnosis not present

## 2016-01-22 DIAGNOSIS — R300 Dysuria: Secondary | ICD-10-CM | POA: Diagnosis present

## 2016-01-22 DIAGNOSIS — Z79899 Other long term (current) drug therapy: Secondary | ICD-10-CM | POA: Insufficient documentation

## 2016-01-22 DIAGNOSIS — J45909 Unspecified asthma, uncomplicated: Secondary | ICD-10-CM | POA: Insufficient documentation

## 2016-01-22 LAB — URINE MICROSCOPIC-ADD ON

## 2016-01-22 LAB — URINALYSIS, ROUTINE W REFLEX MICROSCOPIC
BILIRUBIN URINE: NEGATIVE
Glucose, UA: NEGATIVE mg/dL
KETONES UR: NEGATIVE mg/dL
Nitrite: NEGATIVE
PROTEIN: 100 mg/dL — AB
Specific Gravity, Urine: 1.025 (ref 1.005–1.030)
pH: 7 (ref 5.0–8.0)

## 2016-01-22 LAB — CBG MONITORING, ED: Glucose-Capillary: 136 mg/dL — ABNORMAL HIGH (ref 65–99)

## 2016-01-22 MED ORDER — CEPHALEXIN 500 MG PO CAPS: 500.0000 mg | ORAL_CAPSULE | Freq: Three times a day (TID) | ORAL | 0 refills | Status: DC

## 2016-01-22 MED ORDER — PHENAZOPYRIDINE HCL 100 MG PO TABS
200.0000 mg | ORAL_TABLET | Freq: Once | ORAL | Status: AC
  Administered 2016-01-22: 200 mg via ORAL
  Filled 2016-01-22: qty 2

## 2016-01-22 MED ORDER — CEPHALEXIN 500 MG PO CAPS
500.0000 mg | ORAL_CAPSULE | Freq: Once | ORAL | Status: AC
  Administered 2016-01-22: 500 mg via ORAL
  Filled 2016-01-22: qty 1

## 2016-01-22 MED ORDER — PHENAZOPYRIDINE HCL 200 MG PO TABS: 200.0000 mg | ORAL_TABLET | Freq: Three times a day (TID) | ORAL | 0 refills | Status: DC

## 2016-01-22 NOTE — Discharge Instructions (Signed)
Drink plenty of fluids. Take the antibiotics until gone. Use the pyridium for the pain on urination, it will turn your urine orange and stain your underwear (wear old underwear or wear a panty liner).  Recheck if you aren't improving in the next 48 hours or if you get a fever, chills, worsening pain in your flanks, you get vomiting or worsening pain in your abdomen.  I sent a culture which should result in about 2 days. You will be called if your antibiotic needs to be changed.
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
## 2016-01-22 NOTE — ED Triage Notes (Signed)
Pt c/o urinary frequency and dysuria since yesterday.  

## 2016-01-24 LAB — URINE CULTURE

## 2016-04-08 NOTE — Congregational Nurse Program (Signed)
Congregational Nurse Program Note  Date of Encounter: 04/07/2016  Past Medical History: Past Medical History:  Diagnosis Date  . Arthritis   . Asthma   . Chronic back pain   . Depression   . Diabetes mellitus   . GERD (gastroesophageal reflux disease)   . Hyperlipidemia   . Hypertension   . IDDM (insulin dependent diabetes mellitus) ([place])   . Obesity   . Sleep apnea    uses O2 2 liters  / currently has no cpap     Encounter Details:     CNP Questionnaire - 04/07/16 1000      Patient Demographics   Is this a new or existing patient? New   Patient is considered a/an Not Applicable   Race African-American/Black     Patient Assistance   Location of Patient Assistance [employer] [place]   Patient's financial/insurance status Low Income;Medicaid   Uninsured Patient ([place] [employer]) No   Patient referred to apply for the following financial assistance Not Applicable   Food insecurities addressed Referred to food bank or resource   Transportation assistance No   Assistance securing medications No   Educational health offerings Chronic disease;Diabetes;Hypertension;Navigating the healthcare system     Encounter Details   Primary purpose of visit Chronic Illness/Condition Visit;Education/Health Concerns;Navigating the Healthcare System   Was an Emergency Department visit averted? Not Applicable   Does patient have a medical provider? Yes   Patient referred to Follow up with established PCP   Was a mental health screening completed? (GAINS tool) No   Does patient have dental issues? No   Does patient have vision issues? No   Does your patient have an abnormal blood pressure today? Yes   Since previous encounter, have you referred patient for abnormal blood pressure that resulted in a new diagnosis or medication change? No   Does your patient have an abnormal blood glucose today? No   Since previous encounter, have you referred patient for abnormal blood  glucose that resulted in a new diagnosis or medication change? No   Was there a life-saving intervention made? No      Client encountered at the [facility] during blood pressure screenings. Client has a primary care provider that she sees. Takes her medications as directed per client.

## 2016-05-05 ENCOUNTER — Other Ambulatory Visit (HOSPITAL_COMMUNITY): Payer: Self-pay | Admitting: Family Medicine

## 2016-05-05 DIAGNOSIS — Z1231 Encounter for screening mammogram for malignant neoplasm of breast: Secondary | ICD-10-CM

## 2016-05-09 ENCOUNTER — Emergency Department (HOSPITAL_COMMUNITY)
Admission: EM | Admit: 2016-05-09 | Discharge: 2016-05-09 | Disposition: A | Discharge status: Home / Self Care | Payer: Medicare Other | Attending: Emergency Medicine | Admitting: Emergency Medicine

## 2016-05-09 ENCOUNTER — Encounter (HOSPITAL_COMMUNITY): Payer: Self-pay | Admitting: Emergency Medicine

## 2016-05-09 DIAGNOSIS — R109 Unspecified abdominal pain: Secondary | ICD-10-CM | POA: Diagnosis not present

## 2016-05-09 DIAGNOSIS — E119 Type 2 diabetes mellitus without complications: Secondary | ICD-10-CM | POA: Insufficient documentation

## 2016-05-09 DIAGNOSIS — M545 Low back pain: Secondary | ICD-10-CM | POA: Insufficient documentation

## 2016-05-09 DIAGNOSIS — N898 Other specified noninflammatory disorders of vagina: Secondary | ICD-10-CM | POA: Insufficient documentation

## 2016-05-09 DIAGNOSIS — R300 Dysuria: Secondary | ICD-10-CM | POA: Diagnosis present

## 2016-05-09 DIAGNOSIS — Z79899 Other long term (current) drug therapy: Secondary | ICD-10-CM | POA: Diagnosis not present

## 2016-05-09 DIAGNOSIS — J45909 Unspecified asthma, uncomplicated: Secondary | ICD-10-CM | POA: Insufficient documentation

## 2016-05-09 LAB — WET PREP, GENITAL
Clue Cells Wet Prep HPF POC: NONE SEEN
SPERM: NONE SEEN
TRICH WET PREP: NONE SEEN
Yeast Wet Prep HPF POC: NONE SEEN

## 2016-05-09 LAB — URINALYSIS, ROUTINE W REFLEX MICROSCOPIC
BILIRUBIN URINE: NEGATIVE
GLUCOSE, UA: NEGATIVE mg/dL
HGB URINE DIPSTICK: NEGATIVE
Ketones, ur: NEGATIVE mg/dL
Leukocytes, UA: NEGATIVE
Nitrite: NEGATIVE
PH: 6 (ref 5.0–8.0)
Protein, ur: NEGATIVE mg/dL

## 2016-05-09 NOTE — Discharge Instructions (Signed)
Your urine is negative for infection. You will be called if any of your cultures are positive. Return to the ED if you develop new or worsening symptoms.
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
## 2016-05-09 NOTE — ED Triage Notes (Signed)
Pt here because she thinks she has a UTI. C/O R flank pain, burning with urination, a "whitish" discharge, and odor.

## 2016-05-10 LAB — URINE CULTURE

## 2016-05-11 ENCOUNTER — Ambulatory Visit (HOSPITAL_COMMUNITY): Payer: Self-pay

## 2016-05-11 LAB — GC/CHLAMYDIA PROBE AMP (~~LOC~~) NOT AT ARMC
CHLAMYDIA, DNA PROBE: NEGATIVE
NEISSERIA GONORRHEA: NEGATIVE

## 2016-07-10 ENCOUNTER — Ambulatory Visit (HOSPITAL_COMMUNITY): Payer: Self-pay

## 2016-07-28 ENCOUNTER — Ambulatory Visit: Payer: Self-pay | Admitting: General Surgery

## 2016-08-21 ENCOUNTER — Ambulatory Visit (HOSPITAL_COMMUNITY): Payer: Self-pay

## 2016-08-21 LAB — BASIC METABOLIC PANEL: GLUCOSE: 161 mg/dL

## 2016-08-25 ENCOUNTER — Other Ambulatory Visit (HOSPITAL_COMMUNITY): Payer: Self-pay | Admitting: Family Medicine

## 2016-08-25 ENCOUNTER — Other Ambulatory Visit (HOSPITAL_COMMUNITY): Payer: Self-pay | Admitting: General Surgery

## 2016-08-25 DIAGNOSIS — Z1231 Encounter for screening mammogram for malignant neoplasm of breast: Secondary | ICD-10-CM

## 2016-08-28 NOTE — Patient Instructions (Signed)
LUCILA KLECKA  08/28/2016   Your procedure is scheduled on: 09/04/2016    Report to [facility] Main  Entrance Take [place]  elevators to 3rd floor to  [place] at  [place] AM.    Call this number if you have problems the morning of surgery [phone]    Remember: ONLY 1 PERSON MAY GO WITH YOU TO SHORT STAY TO GET  READY MORNING OF [place].  Do not eat food or drink liquids :After Midnight.     Take these medicines the morning of surgery with A SIP OF WATER: Albuterol Inhaler if needed and bring, Symbicort Inhaler if needed and bring, Cardizem  DO NOT TAKE ANY DIABETIC MEDICATIONS DAY OF YOUR SURGERY                               You may not have any metal on your body including hair pins and              piercings  Do not wear jewelry, make-up, lotions, powders or perfumes, deodorant             Do not wear nail polish.  Do not shave  48 hours prior to surgery.     Do not bring valuables to the hospital. [place].  Contacts, dentures or bridgework may not be worn into surgery.  Leave suitcase in the car. After surgery it may be brought to your room.                       Please read over the following fact sheets you were given: _____________________________________________________________________             [facility] - Preparing for Surgery Before surgery, you can play an important role.  Because skin is not sterile, your skin needs to be as free of germs as possible.  You can reduce the number of germs on your skin by washing with CHG (chlorahexidine gluconate) soap before surgery.  CHG is an antiseptic cleaner which kills germs and bonds with the skin to continue killing germs even after washing. Please DO NOT use if you have an allergy to CHG or antibacterial soaps.  If your skin becomes reddened/irritated stop using the CHG and inform your nurse when you arrive at Short Stay. Do not  shave (including legs and underarms) for at least 48 hours prior to the first CHG shower.  You may shave your face/neck. Please follow these instructions carefully:  1.  Shower with CHG Soap the night before surgery and the  morning of Surgery.  2.  If you choose to wash your hair, wash your hair first as usual with your  normal  shampoo.  3.  After you shampoo, rinse your hair and body thoroughly to remove the  shampoo.                           4.  Use CHG as you would any other liquid soap.  You can apply chg directly  to the skin and wash  Gently with a scrungie or clean washcloth.  5.  Apply the CHG Soap to your body ONLY FROM THE NECK DOWN.   Do not use on face/ open                           Wound or open sores. Avoid contact with eyes, ears mouth and genitals (private parts).                       Wash face,  Genitals (private parts) with your normal soap.             6.  Wash thoroughly, paying special attention to the area where your surgery  will be performed.  7.  Thoroughly rinse your body with warm water from the neck down.  8.  DO NOT shower/wash with your normal soap after using and rinsing off  the CHG Soap.                9.  Pat yourself dry with a clean towel.            10.  Wear clean pajamas.            11.  Place clean sheets on your bed the night of your first shower and do not  sleep with pets. Day of Surgery : Do not apply any lotions/deodorants the morning of surgery.  Please wear clean clothes to the hospital/surgery center.  FAILURE TO FOLLOW THESE INSTRUCTIONS MAY RESULT IN THE CANCELLATION OF YOUR SURGERY PATIENT SIGNATURE_________________________________  NURSE SIGNATURE__________________________________  ________________________________________________________________________ How to Manage Your Diabetes Before and After Surgery  Why is it important to control my blood sugar before and after surgery? . Improving blood sugar levels  before and after surgery helps healing and can limit problems. . A way of improving blood sugar control is eating a healthy diet by: o  Eating less sugar and carbohydrates o  Increasing activity/exercise o  Talking with your doctor about reaching your blood sugar goals . High blood sugars (greater than 180 mg/dL) can raise your risk of infections and slow your recovery, so you will need to focus on controlling your diabetes during the weeks before surgery. . Make sure that the doctor who takes care of your diabetes knows about your planned surgery including the date and location.  How do I manage my blood sugar before surgery? . Check your blood sugar at least 4 times a day, starting 2 days before surgery, to make sure that the level is not too high or low. o Check your blood sugar the morning of your surgery when you wake up and every 2 hours until you get to the Short Stay unit. . If your blood sugar is less than 70 mg/dL, you will need to treat for low blood sugar: o Do not take insulin. o Treat a low blood sugar (less than 70 mg/dL) with  cup of clear juice (cranberry or apple), 4 glucose tablets, OR glucose gel. o Recheck blood sugar in 15 minutes after treatment (to make sure it is greater than 70 mg/dL). If your blood sugar is not greater than 70 mg/dL on recheck, call [phone] for further instructions. . Report your blood sugar to the short stay nurse when you get to Short Stay.  . If you are admitted to the hospital after surgery: o Your blood sugar will be checked by the staff and you will probably be  given insulin after surgery (instead of oral diabetes medicines) to make sure you have good blood sugar levels. o The goal for blood sugar control after surgery is 80-180 mg/dL.   WHAT DO I DO ABOUT MY DIABETES MEDICATION?  Marland Kitchen Do not take oral diabetes medicines (pills) the morning of surgery.  . THE NIGHT BEFORE SURGERY, take     units of       insulin.       . THE MORNING OF  SURGERY, take   units of         insulin.  .   .   .   Patient Signature:  Date:   Nurse Signature:  Date:   Reviewed and Endorsed by [facility] Patient Education Committee, August 2015

## 2016-08-31 ENCOUNTER — Ambulatory Visit (HOSPITAL_COMMUNITY): Payer: Self-pay

## 2016-08-31 ENCOUNTER — Encounter (HOSPITAL_COMMUNITY)
Admission: RE | Admit: 2016-08-31 | Discharge: 2016-08-31 | Disposition: A | Discharge status: Home / Self Care | Payer: Medicare Other | Source: Ambulatory Visit | Attending: General Surgery | Admitting: General Surgery

## 2016-08-31 ENCOUNTER — Encounter (HOSPITAL_COMMUNITY): Payer: Self-pay

## 2016-08-31 DIAGNOSIS — Z01812 Encounter for preprocedural laboratory examination: Secondary | ICD-10-CM | POA: Diagnosis present

## 2016-08-31 DIAGNOSIS — Z01810 Encounter for preprocedural cardiovascular examination: Secondary | ICD-10-CM | POA: Diagnosis present

## 2016-08-31 DIAGNOSIS — E65 Localized adiposity: Secondary | ICD-10-CM | POA: Diagnosis not present

## 2016-08-31 LAB — CBC
HCT: 37.5 % (ref 36.0–46.0)
HEMOGLOBIN: 11.8 g/dL — AB (ref 12.0–15.0)
MCH: 28.6 pg (ref 26.0–34.0)
MCHC: 31.5 g/dL (ref 30.0–36.0)
MCV: 90.8 fL (ref 78.0–100.0)
Platelets: 188 10*3/uL (ref 150–400)
RBC: 4.13 MIL/uL (ref 3.87–5.11)
RDW: 13.3 % (ref 11.5–15.5)
WBC: 5.4 10*3/uL (ref 4.0–10.5)

## 2016-08-31 LAB — BASIC METABOLIC PANEL WITH GFR
Anion gap: 6 (ref 5–15)
BUN: 13 mg/dL (ref 6–20)
CO2: 26 mmol/L (ref 22–32)
Calcium: 8.6 mg/dL — ABNORMAL LOW (ref 8.9–10.3)
Chloride: 106 mmol/L (ref 101–111)
Creatinine, Ser: 0.8 mg/dL (ref 0.44–1.00)
GFR calc Af Amer: >60 mL/min
GFR calc non Af Amer: >60 mL/min
Glucose, Bld: 127 mg/dL — ABNORMAL HIGH (ref 65–99)
Potassium: 4.1 mmol/L (ref 3.5–5.1)
Sodium: 138 mmol/L (ref 135–145)

## 2016-08-31 LAB — GLUCOSE, CAPILLARY: GLUCOSE-CAPILLARY: 76 mg/dL (ref 65–99)

## 2016-09-01 LAB — HEMOGLOBIN A1C
Hgb A1c MFr Bld: 6.4 % — ABNORMAL HIGH (ref 4.8–5.6)
Mean Plasma Glucose: 137 mg/dL

## 2016-09-02 NOTE — Progress Notes (Signed)
Final ekg done 08/31/16 in epic

## 2016-09-03 NOTE — Anesthesia Preprocedure Evaluation (Addendum)
Anesthesia Evaluation  Patient identified by MRN, date of birth, ID band Patient awake    Reviewed: Allergy & Precautions, NPO status , Patient's Chart, lab work & pertinent test results  Airway Mallampati: II  TM Distance: >3 FB Neck ROM: Full    Dental  (+) Teeth Intact, Dental Advisory Given, Missing,    Pulmonary asthma , sleep apnea (2L), Continuous Positive Airway Pressure Ventilation and Oxygen sleep apnea ,    Pulmonary exam normal breath sounds clear to auscultation       Cardiovascular hypertension, Pt. on medications Normal cardiovascular exam Rhythm:Regular Rate:Normal     Neuro/Psych PSYCHIATRIC DISORDERS Anxiety Depression negative neurological ROS     GI/Hepatic Neg liver ROS, GERD  ,Abdominal panniculus with infection   Endo/Other  diabetes, Type 2, Insulin DependentObesity   Renal/GU negative Renal ROS     Musculoskeletal  (+) Arthritis , Osteoarthritis,    Abdominal   Peds  Hematology negative hematology ROS (+)   Anesthesia Other Findings Day of surgery medications reviewed with the patient.                     VERY DIFFICULT IV ACCESS NEED TO USE ULTRASOUND FOR ANY SUBSEQUENT CASES!!!!  Reproductive/Obstetrics                          Anesthesia Physical Anesthesia Plan  ASA: III  Anesthesia Plan: General   Post-op Pain Management:    Induction: Intravenous  Airway Management Planned: Oral ETT  Additional Equipment:   Intra-op Plan:   Post-operative Plan: Extubation in OR  Informed Consent: I have reviewed the patients History and Physical, chart, labs and discussed the procedure including the risks, benefits and alternatives for the proposed anesthesia with the patient or authorized representative who has indicated his/her understanding and acceptance.   Dental advisory given  Plan Discussed with: CRNA  Anesthesia Plan Comments: (Risks/benefits of general  anesthesia discussed with patient including risk of damage to teeth, lips, gum, and tongue, nausea/vomiting, allergic reactions to medications, and the possibility of heart attack, stroke and death.  All patient questions answered.  Patient wishes to proceed.)       Anesthesia Quick Evaluation

## 2016-09-04 ENCOUNTER — Inpatient Hospital Stay (HOSPITAL_COMMUNITY)
Admission: RE | Admit: 2016-09-04 | Discharge: 2016-09-07 | DRG: 641 | Disposition: A | Discharge status: Home / Self Care | Payer: Medicare Other | Source: Ambulatory Visit | Attending: General Surgery | Admitting: General Surgery

## 2016-09-04 ENCOUNTER — Encounter (HOSPITAL_COMMUNITY)
Admission: RE | Disposition: A | Discharge status: Home / Self Care | Payer: Self-pay | Source: Ambulatory Visit | Attending: General Surgery

## 2016-09-04 ENCOUNTER — Inpatient Hospital Stay (HOSPITAL_COMMUNITY): Payer: Medicare Other | Admitting: Anesthesiology

## 2016-09-04 ENCOUNTER — Encounter (HOSPITAL_COMMUNITY): Payer: Self-pay

## 2016-09-04 DIAGNOSIS — E119 Type 2 diabetes mellitus without complications: Secondary | ICD-10-CM | POA: Diagnosis not present

## 2016-09-04 DIAGNOSIS — F419 Anxiety disorder, unspecified: Secondary | ICD-10-CM | POA: Diagnosis present

## 2016-09-04 DIAGNOSIS — Z9884 Bariatric surgery status: Secondary | ICD-10-CM | POA: Diagnosis not present

## 2016-09-04 DIAGNOSIS — L089 Local infection of the skin and subcutaneous tissue, unspecified: Secondary | ICD-10-CM | POA: Diagnosis present

## 2016-09-04 DIAGNOSIS — F329 Major depressive disorder, single episode, unspecified: Secondary | ICD-10-CM | POA: Diagnosis present

## 2016-09-04 DIAGNOSIS — E65 Localized adiposity: Secondary | ICD-10-CM | POA: Diagnosis not present

## 2016-09-04 DIAGNOSIS — Z7982 Long term (current) use of aspirin: Secondary | ICD-10-CM | POA: Diagnosis not present

## 2016-09-04 DIAGNOSIS — Z794 Long term (current) use of insulin: Secondary | ICD-10-CM

## 2016-09-04 DIAGNOSIS — K219 Gastro-esophageal reflux disease without esophagitis: Secondary | ICD-10-CM | POA: Diagnosis not present

## 2016-09-04 DIAGNOSIS — Z6837 Body mass index (BMI) 37.0-37.9, adult: Secondary | ICD-10-CM | POA: Diagnosis not present

## 2016-09-04 DIAGNOSIS — Z79899 Other long term (current) drug therapy: Secondary | ICD-10-CM | POA: Diagnosis not present

## 2016-09-04 DIAGNOSIS — G4730 Sleep apnea, unspecified: Secondary | ICD-10-CM | POA: Diagnosis present

## 2016-09-04 DIAGNOSIS — E7800 Pure hypercholesterolemia, unspecified: Secondary | ICD-10-CM | POA: Diagnosis not present

## 2016-09-04 DIAGNOSIS — J45909 Unspecified asthma, uncomplicated: Secondary | ICD-10-CM | POA: Diagnosis present

## 2016-09-04 DIAGNOSIS — B372 Candidiasis of skin and nail: Secondary | ICD-10-CM | POA: Diagnosis not present

## 2016-09-04 DIAGNOSIS — I10 Essential (primary) hypertension: Secondary | ICD-10-CM | POA: Diagnosis not present

## 2016-09-04 HISTORY — PX: PANNICULECTOMY: SHX5360

## 2016-09-04 HISTORY — DX: Other specified health status: Z78.9

## 2016-09-04 LAB — GLUCOSE, CAPILLARY
Glucose-Capillary: 92 mg/dL (ref 65–99)
Glucose-Capillary: 119 mg/dL — ABNORMAL HIGH (ref 65–99)
Glucose-Capillary: 158 mg/dL — ABNORMAL HIGH (ref 65–99)
Glucose-Capillary: 143 mg/dL — ABNORMAL HIGH (ref 65–99)
Glucose-Capillary: 139 mg/dL — ABNORMAL HIGH (ref 65–99)

## 2016-09-04 SURGERY — PANNICULECTOMY
Anesthesia: General | Site: Abdomen | Laterality: Bilateral

## 2016-09-04 MED ORDER — HEPARIN SODIUM (PORCINE) 5000 UNIT/ML IJ SOLN
5000.0000 [IU] | Freq: Once | INTRAMUSCULAR | Status: AC
  Administered 2016-09-04: 5000 [IU] via SUBCUTANEOUS
  Filled 2016-09-04: qty 1

## 2016-09-04 MED ORDER — PHENYLEPHRINE HCL 10 MG/ML IJ SOLN
INTRAMUSCULAR | Status: DC | PRN
  Administered 2016-09-04 (×2): 80 ug via INTRAVENOUS

## 2016-09-04 MED ORDER — ALPRAZOLAM 1 MG PO TABS
1.0000 mg | ORAL_TABLET | Freq: Four times a day (QID) | ORAL | Status: DC
  Administered 2016-09-04 – 2016-09-05 (×4): 1 mg via ORAL
  Filled 2016-09-04 (×5): qty 1

## 2016-09-04 MED ORDER — CELECOXIB 200 MG PO CAPS
400.0000 mg | ORAL_CAPSULE | ORAL | Status: AC
  Administered 2016-09-04: 400 mg via ORAL
  Filled 2016-09-04: qty 2

## 2016-09-04 MED ORDER — SUGAMMADEX SODIUM 200 MG/2ML IV SOLN
INTRAVENOUS | Status: DC | PRN
  Administered 2016-09-04: 120 mg via INTRAVENOUS

## 2016-09-04 MED ORDER — ACETAMINOPHEN 10 MG/ML IV SOLN: INTRAVENOUS | Status: DC | PRN

## 2016-09-04 MED ORDER — POTASSIUM CHLORIDE IN NACL 20-0.9 MEQ/L-% IV SOLN
INTRAVENOUS | Status: DC
  Administered 2016-09-04 (×2): via INTRAVENOUS
  Filled 2016-09-04 (×2): qty 1000

## 2016-09-04 MED ORDER — ONDANSETRON HCL 4 MG/2ML IJ SOLN
INTRAMUSCULAR | Status: AC
  Filled 2016-09-04: qty 2

## 2016-09-04 MED ORDER — FENTANYL CITRATE (PF) 250 MCG/5ML IJ SOLN
INTRAMUSCULAR | Status: AC
  Filled 2016-09-04: qty 5

## 2016-09-04 MED ORDER — 0.9 % SODIUM CHLORIDE (POUR BTL) OPTIME
TOPICAL | Status: DC | PRN
  Administered 2016-09-04: 2000 mL

## 2016-09-04 MED ORDER — DEXAMETHASONE SODIUM PHOSPHATE 10 MG/ML IJ SOLN
INTRAMUSCULAR | Status: DC | PRN
  Administered 2016-09-04: 4 mg via INTRAVENOUS

## 2016-09-04 MED ORDER — ROCURONIUM BROMIDE 100 MG/10ML IV SOLN
INTRAVENOUS | Status: DC | PRN
  Administered 2016-09-04: 50 mg via INTRAVENOUS
  Administered 2016-09-04: 10 mg via INTRAVENOUS

## 2016-09-04 MED ORDER — PROPOFOL 10 MG/ML IV BOLUS
INTRAVENOUS | Status: DC | PRN
  Administered 2016-09-04: 50 mg via INTRAVENOUS
  Administered 2016-09-04: 150 mg via INTRAVENOUS

## 2016-09-04 MED ORDER — MIDAZOLAM HCL 5 MG/5ML IJ SOLN
INTRAMUSCULAR | Status: DC | PRN
  Administered 2016-09-04: 2 mg via INTRAVENOUS

## 2016-09-04 MED ORDER — KETAMINE HCL 10 MG/ML IJ SOLN
INTRAMUSCULAR | Status: DC | PRN
  Administered 2016-09-04: 25 mg via INTRAVENOUS

## 2016-09-04 MED ORDER — SCOPOLAMINE 1 MG/3DAYS TD PT72
1.0000 | MEDICATED_PATCH | Freq: Once | TRANSDERMAL | Status: AC
  Administered 2016-09-04: 1 via TRANSDERMAL

## 2016-09-04 MED ORDER — DILTIAZEM HCL ER COATED BEADS 180 MG PO CP24
360.0000 mg | ORAL_CAPSULE | Freq: Every day | ORAL | Status: DC
  Administered 2016-09-05 – 2016-09-07 (×3): 360 mg via ORAL
  Filled 2016-09-04 (×3): qty 2

## 2016-09-04 MED ORDER — ONDANSETRON 4 MG PO TBDP: 4.0000 mg | ORAL_TABLET | Freq: Four times a day (QID) | ORAL | Status: DC | PRN

## 2016-09-04 MED ORDER — DEXAMETHASONE SODIUM PHOSPHATE 10 MG/ML IJ SOLN
INTRAMUSCULAR | Status: AC
  Filled 2016-09-04: qty 1

## 2016-09-04 MED ORDER — CEFAZOLIN SODIUM-DEXTROSE 2-4 GM/100ML-% IV SOLN
2.0000 g | INTRAVENOUS | Status: AC
  Administered 2016-09-04: 2 g via INTRAVENOUS
  Filled 2016-09-04: qty 100

## 2016-09-04 MED ORDER — ALBUTEROL SULFATE HFA 108 (90 BASE) MCG/ACT IN AERS: 1.0000 | INHALATION_SPRAY | Freq: Four times a day (QID) | RESPIRATORY_TRACT | Status: DC | PRN

## 2016-09-04 MED ORDER — LIDOCAINE 2% (20 MG/ML) 5 ML SYRINGE
INTRAMUSCULAR | Status: AC
  Filled 2016-09-04: qty 15

## 2016-09-04 MED ORDER — ROCURONIUM BROMIDE 50 MG/5ML IV SOSY
PREFILLED_SYRINGE | INTRAVENOUS | Status: AC
  Filled 2016-09-04: qty 5

## 2016-09-04 MED ORDER — MOMETASONE FURO-FORMOTEROL FUM 200-5 MCG/ACT IN AERO
2.0000 | INHALATION_SPRAY | Freq: Two times a day (BID) | RESPIRATORY_TRACT | Status: DC
  Administered 2016-09-05 – 2016-09-07 (×5): 2 via RESPIRATORY_TRACT
  Filled 2016-09-04: qty 8.8

## 2016-09-04 MED ORDER — FENTANYL CITRATE (PF) 100 MCG/2ML IJ SOLN
25.0000 ug | INTRAMUSCULAR | Status: DC | PRN
  Administered 2016-09-04: 50 ug via INTRAVENOUS

## 2016-09-04 MED ORDER — ACETAMINOPHEN 500 MG PO TABS
1000.0000 mg | ORAL_TABLET | ORAL | Status: AC
  Administered 2016-09-04: 1000 mg via ORAL
  Filled 2016-09-04: qty 2

## 2016-09-04 MED ORDER — INSULIN ASPART 100 UNIT/ML ~~LOC~~ SOLN
0.0000 [IU] | Freq: Three times a day (TID) | SUBCUTANEOUS | Status: DC
  Administered 2016-09-04: 3 [IU] via SUBCUTANEOUS
  Administered 2016-09-04 – 2016-09-06 (×3): 2 [IU] via SUBCUTANEOUS
  Administered 2016-09-07: 3 [IU] via SUBCUTANEOUS

## 2016-09-04 MED ORDER — MORPHINE SULFATE (PF) 4 MG/ML IV SOLN: 2.0000 mg | INTRAVENOUS | Status: DC | PRN

## 2016-09-04 MED ORDER — LIDOCAINE HCL (CARDIAC) 20 MG/ML IV SOLN
INTRAVENOUS | Status: DC | PRN
  Administered 2016-09-04: 25 mg via INTRATRACHEAL
  Administered 2016-09-04: 75 mg via INTRAVENOUS

## 2016-09-04 MED ORDER — POLYETHYLENE GLYCOL 3350 17 G PO PACK: 17.0000 g | PACK | Freq: Every day | ORAL | Status: DC | PRN

## 2016-09-04 MED ORDER — LACTATED RINGERS IV SOLN
INTRAVENOUS | Status: DC | PRN
  Administered 2016-09-04 (×2): via INTRAVENOUS

## 2016-09-04 MED ORDER — PRAVASTATIN SODIUM 20 MG PO TABS
80.0000 mg | ORAL_TABLET | Freq: Every evening | ORAL | Status: DC
  Administered 2016-09-04 – 2016-09-06 (×3): 80 mg via ORAL
  Filled 2016-09-04 (×3): qty 4

## 2016-09-04 MED ORDER — ALBUTEROL SULFATE (2.5 MG/3ML) 0.083% IN NEBU: 2.5000 mg | INHALATION_SOLUTION | Freq: Four times a day (QID) | RESPIRATORY_TRACT | Status: DC | PRN

## 2016-09-04 MED ORDER — PROMETHAZINE HCL 25 MG/ML IJ SOLN: 6.2500 mg | INTRAMUSCULAR | Status: DC | PRN

## 2016-09-04 MED ORDER — ONDANSETRON HCL 4 MG/2ML IJ SOLN
INTRAMUSCULAR | Status: DC | PRN
  Administered 2016-09-04: 4 mg via INTRAVENOUS

## 2016-09-04 MED ORDER — LIDOCAINE 2% (20 MG/ML) 5 ML SYRINGE
INTRAMUSCULAR | Status: AC
  Filled 2016-09-04: qty 5

## 2016-09-04 MED ORDER — PROPOFOL 10 MG/ML IV BOLUS
INTRAVENOUS | Status: AC
  Filled 2016-09-04: qty 40

## 2016-09-04 MED ORDER — GABAPENTIN 300 MG PO CAPS
300.0000 mg | ORAL_CAPSULE | ORAL | Status: AC
  Administered 2016-09-04: 300 mg via ORAL
  Filled 2016-09-04: qty 1

## 2016-09-04 MED ORDER — ONDANSETRON HCL 4 MG/2ML IJ SOLN: 4.0000 mg | Freq: Four times a day (QID) | INTRAMUSCULAR | Status: DC | PRN

## 2016-09-04 MED ORDER — PANTOPRAZOLE SODIUM 40 MG IV SOLR
40.0000 mg | Freq: Every day | INTRAVENOUS | Status: DC
  Administered 2016-09-04 – 2016-09-06 (×3): 40 mg via INTRAVENOUS
  Filled 2016-09-04 (×3): qty 40

## 2016-09-04 MED ORDER — LIDOCAINE 2% (20 MG/ML) 5 ML SYRINGE
INTRAMUSCULAR | Status: DC | PRN
  Administered 2016-09-04: 1.5 mg/kg/h via INTRAVENOUS

## 2016-09-04 MED ORDER — OXYCODONE HCL 5 MG PO TABS
5.0000 mg | ORAL_TABLET | ORAL | Status: DC | PRN
  Administered 2016-09-04 – 2016-09-05 (×5): 5 mg via ORAL
  Administered 2016-09-05 (×2): 10 mg via ORAL
  Administered 2016-09-05 – 2016-09-06 (×3): 5 mg via ORAL
  Administered 2016-09-06: 10 mg via ORAL
  Administered 2016-09-07 (×2): 5 mg via ORAL
  Administered 2016-09-07: 10 mg via ORAL
  Filled 2016-09-04 (×3): qty 1
  Filled 2016-09-04 (×2): qty 2
  Filled 2016-09-04 (×3): qty 1
  Filled 2016-09-04: qty 2
  Filled 2016-09-04 (×5): qty 1
  Filled 2016-09-04: qty 2
  Filled 2016-09-04: qty 1

## 2016-09-04 MED ORDER — MIDAZOLAM HCL 2 MG/2ML IJ SOLN
INTRAMUSCULAR | Status: AC
  Filled 2016-09-04: qty 2

## 2016-09-04 MED ORDER — SUCCINYLCHOLINE CHLORIDE 20 MG/ML IJ SOLN: INTRAMUSCULAR | Status: DC | PRN

## 2016-09-04 MED ORDER — ADULT MULTIVITAMIN W/MINERALS CH
1.0000 | ORAL_TABLET | Freq: Every day | ORAL | Status: DC
  Administered 2016-09-05 – 2016-09-07 (×3): 1 via ORAL
  Filled 2016-09-04 (×3): qty 1

## 2016-09-04 MED ORDER — CHLORHEXIDINE GLUCONATE CLOTH 2 % EX PADS: 6.0000 | MEDICATED_PAD | Freq: Once | CUTANEOUS | Status: DC

## 2016-09-04 MED ORDER — FENTANYL CITRATE (PF) 100 MCG/2ML IJ SOLN
INTRAMUSCULAR | Status: AC
  Filled 2016-09-04: qty 2

## 2016-09-04 MED ORDER — DICLOFENAC SODIUM 1 % TD GEL: 1.0000 "application " | Freq: Three times a day (TID) | TRANSDERMAL | Status: DC | PRN

## 2016-09-04 MED ORDER — KETAMINE HCL 10 MG/ML IJ SOLN
INTRAMUSCULAR | Status: AC
  Filled 2016-09-04: qty 1

## 2016-09-04 MED ORDER — SCOPOLAMINE 1 MG/3DAYS TD PT72
MEDICATED_PATCH | TRANSDERMAL | Status: AC
  Filled 2016-09-04: qty 1

## 2016-09-04 MED ORDER — ENOXAPARIN SODIUM 40 MG/0.4ML ~~LOC~~ SOLN
40.0000 mg | SUBCUTANEOUS | Status: DC
  Administered 2016-09-05 – 2016-09-07 (×3): 40 mg via SUBCUTANEOUS
  Filled 2016-09-04 (×3): qty 0.4

## 2016-09-04 MED ORDER — LABETALOL HCL 5 MG/ML IV SOLN
INTRAVENOUS | Status: DC | PRN
  Administered 2016-09-04: 5 mg via INTRAVENOUS

## 2016-09-04 MED ORDER — FENTANYL CITRATE (PF) 100 MCG/2ML IJ SOLN
INTRAMUSCULAR | Status: DC | PRN
  Administered 2016-09-04 (×2): 100 ug via INTRAVENOUS
  Administered 2016-09-04: 50 ug via INTRAVENOUS
  Administered 2016-09-04: 100 ug via INTRAVENOUS

## 2016-09-04 MED ORDER — SUFENTANIL CITRATE 50 MCG/ML IV SOLN: INTRAVENOUS | Status: DC | PRN

## 2016-09-04 SURGICAL SUPPLY — 47 items
APPLIER CLIP 11 MED OPEN (CLIP) ×6
APPLIER CLIP 13 LRG OPEN (CLIP) ×3
APR CLP LRG 13 20 CLIP (CLIP) ×1
APR CLP MED 11 20 MLT OPN (CLIP) ×2
BINDER ABDOMINAL 12 ML 46-62 (SOFTGOODS) ×2 IMPLANT
BLADE EXTENDED COATED 6.5IN (ELECTRODE) ×2 IMPLANT
BLADE HEX COATED 2.75 (ELECTRODE) ×3 IMPLANT
BLADE SURG 15 STRL LF DISP TIS (BLADE) ×1 IMPLANT
BLADE SURG 15 STRL SS (BLADE) ×3
BLADE SURG SZ10 CARB STEEL (BLADE) ×9 IMPLANT
CHLORAPREP W/TINT 26ML (MISCELLANEOUS) ×4 IMPLANT
CLEANER TIP ELECTROSURG 2X2 (MISCELLANEOUS) ×2 IMPLANT
CLIP APPLIE 11 MED OPEN (CLIP) IMPLANT
CLIP APPLIE 13 LRG OPEN (CLIP) IMPLANT
COVER MAYO STAND STRL (DRAPES) ×2 IMPLANT
DRAPE LAPAROTOMY T 102X78X121 (DRAPES) ×2 IMPLANT
DRAPE WARM FLUID 44X44 (DRAPE) ×3 IMPLANT
ELECT PENCIL ROCKER SW 15FT (MISCELLANEOUS) ×5 IMPLANT
ELECT REM PT RETURN 15FT ADLT (MISCELLANEOUS) ×3 IMPLANT
GLOVE BIO SURGEON STRL SZ7.5 (GLOVE) ×2 IMPLANT
GLOVE BIOGEL M STRL SZ7.5 (GLOVE) ×2 IMPLANT
GLOVE BIOGEL PI IND STRL 7.0 (GLOVE) ×1 IMPLANT
GLOVE BIOGEL PI IND STRL 7.5 (GLOVE) IMPLANT
GLOVE BIOGEL PI INDICATOR 7.0 (GLOVE) ×2
GLOVE BIOGEL PI INDICATOR 7.5 (GLOVE) ×6
GLOVE ECLIPSE 8.0 STRL XLNG CF (GLOVE) ×2 IMPLANT
GLOVE SURG SS PI 7.5 STRL IVOR (GLOVE) ×2 IMPLANT
GOWN STRL REUS W/ TWL XL LVL3 (GOWN DISPOSABLE) IMPLANT
GOWN STRL REUS W/TWL XL LVL3 (GOWN DISPOSABLE) ×9 IMPLANT
KIT BASIN OR (CUSTOM PROCEDURE TRAY) ×3 IMPLANT
LIGASURE IMPACT 36 18CM CVD LR (INSTRUMENTS) ×4 IMPLANT
PACK UNIVERSAL I (CUSTOM PROCEDURE TRAY) ×3 IMPLANT
PREVENA INCISION MGT 90 150 (MISCELLANEOUS) ×2 IMPLANT
SPONGE LAP 18X18 X RAY DECT (DISPOSABLE) ×9 IMPLANT
STAPLER VISISTAT 35W (STAPLE) ×6 IMPLANT
SUT SILK 2 0 (SUTURE) ×3
SUT SILK 2-0 18XBRD TIE 12 (SUTURE) ×1 IMPLANT
SUT VIC AB 3-0 SH 18 (SUTURE) ×16 IMPLANT
SUT VIC AB 4-0 SH 18 (SUTURE) IMPLANT
SUT VICRYL 2 0 18  UND BR (SUTURE) ×4
SUT VICRYL 2 0 18 UND BR (SUTURE) IMPLANT
SYR BULB IRRIGATION 50ML (SYRINGE) ×3 IMPLANT
TOWEL NATURAL 10PK STERILE (DISPOSABLE) ×4 IMPLANT
TOWEL OR 17X26 10 PK STRL BLUE (TOWEL DISPOSABLE) ×3 IMPLANT
TRAY FOLEY CATH SILVER 14FR (SET/KITS/TRAYS/PACK) ×2 IMPLANT
YANKAUER SUCT BULB TIP 10FT TU (MISCELLANEOUS) ×3 IMPLANT
YANKAUER SUCT BULB TIP NO VENT (SUCTIONS) ×2 IMPLANT

## 2016-09-04 NOTE — Op Note (Signed)
Preoperative Diagnosis: Abdominal panniculus with infection  Postoprative Diagnosis: Abdominal panniculus with infection  Procedure: Procedure(s): PANNICULECTOMY   Surgeon: Excell Seltzer T   Assistants: Johnathan Hausen  Anesthesia:  General endotracheal anesthesia  Indications: Patient is a 61 year old female over one year status post laparoscopic Roux-en-Y gastric bypass with excellent weight loss of approximately 150 pounds who has a large abdominal pannus with repeated skin infections. After preoperative discussion regarding indications and risks detailed elsewhere we have decided to proceed with panniculectomy.    Procedure Detail:  Patient was brought to the operating room, placed in the supine position on the operating table, and general endotracheal anesthesia induced. Foley catheter was placed. She received broad-spectrum preoperative IV antibiotics. The entire anterior abdominal wall and upper thighs were prepped down to the table and sterilely draped. Patient timeout was performed and correct procedure verified. I had previously marked the patient standing in the holding area for the panniculectomy. A long curvilinear elliptical incision was planned and as previously discussed with elected to encompass the umbilicus within the excision and the incision superiorly was just above the umbilicus and inferiorly several centimeters above the pubis and extending up to the flanks bilaterally. The incision was made sharply and dissection carried down into the subcutaneous tissue and hemostasis obtained with cautery. The incision was deepened and all areas down to the anterior abdominal wall being careful to avoid the fascia. Then beginning on either and of the panus with the LigaSure device the pelvis was dissected up off of the anterior abdominal wall. She had a previous low midline incision with some scarring but no evidence of hernia. The umbilicus was taken at its base and no evidence of  hernia here. The pannus was removed and sent for pathology. Hemostasis was obtained with cautery. The wound was then closed in a layered fashion with interrupted 3-0 Vicryl and the skin closed with staples. A Proveena incisional wound VAC dressing was then applied with a good seal. Sponge and needle counts were correct.    Findings: As above  Estimated Blood Loss:  less than 100 mL         Drains: None  Blood Given: none          Specimens: Abdominal pannus        Complications:  * No complications entered in OR log *         Disposition: PACU - hemodynamically stable.         Condition: stable

## 2016-09-04 NOTE — Anesthesia Procedure Notes (Signed)
Procedure Name: Intubation Date/Time: 09/04/2016 7:47 AM Performed by: Lissa Morales Pre-anesthesia Checklist: Patient identified, Emergency Drugs available, Suction available and Patient being monitored Patient Re-evaluated:Patient Re-evaluated prior to inductionOxygen Delivery Method: Circle system utilized Preoxygenation: Pre-oxygenation with 100% oxygen Intubation Type: IV induction Ventilation: Mask ventilation without difficulty Laryngoscope Size: Mac and 4 Grade View: Grade II Tube type: Oral Tube size: 7.5 mm Number of attempts: 1 Airway Equipment and Method: Stylet and Oral airway Placement Confirmation: ETT inserted through vocal cords under direct vision,  positive ETCO2 and breath sounds checked- equal and bilateral Secured at: 21 cm Tube secured with: Tape Dental Injury: Teeth and Oropharynx as per pre-operative assessment

## 2016-09-04 NOTE — H&P (Signed)
History of Present Illness  The patient is a 61 year old female presenting status-post bariatric surgery. She returns now 1 year following Roux-en-Y gastric bypass for extreme morbid obesity and multiple comorbidities including sleep apnea, arthritis, back pain, GERD, hypertension, elevated cholesterol and insulin dependent diabetes mellitus. She continues to get along quite well. Weight is down a total of 116 pounds, BMI down from 58-37. She is on markedly reduced insulin of 10 units versus 100. Off of CPAP without symptoms of sleep apnea. Still has significant knee pain but has apparent bone-on-bone arthritis and will require replacement.. Denies reflux. She has had repeated infections underneath a very large pannus that if required treatment by her primary physician. The skin infection under her pannus is her main complaint in the office today.   Problem List/Past Medical  MORBID OBESITY WITH BMI OF 50.0-59.9, ADULT (E66.01)  CONSTIPATION, ACUTE (K59.00)  GASTRIC BYPASS STATUS FOR OBESITY (Z98.84)   Past Surgical History Cesarean Section - 1  Hysterectomy (not due to cancer) - Partial  Knee Surgery  Bilateral. Shoulder Surgery  Right.  Diagnostic Studies History  Colonoscopy  never Mammogram  1-3 years ago Pap Smear  1-5 years ago  Allergies  Liraglutide *ANTIDIABETICS*   Medication History  Oxycodone-Acetaminophen (5-325MG  Tablet, Oral) Active. Albuterol Sulfate (108 (90 Base)MCG/ACT Aero Pow Br Act, Inhalation) Active. Xanax (1MG  Tablet, Oral) Active. Aspirin (81MG  Tablet, Oral) Active. Voltaren (1% Gel, External) Active. Cardizem CD (240MG  Capsule ER 24HR, Oral) Active. NexIUM (40MG  Packet, Oral) Active. GlipiZIDE (5MG  Tablet, Oral) Active. NovoLOG Mix 70/30 ((70-30) 100UNIT/ML Suspension, Subcutaneous) Active. Trilipix (135MG  Capsule DR, Oral) Active. Pravastatin Sodium (80MG  Tablet, Oral) Active. Januvia (100MG  Tablet, Oral) Active. ProAir  HFA (108 (90 Base)MCG/ACT Aerosol Soln, Inhalation) Active. Medications Reconciled  Social History  Alcohol use  Occasional alcohol use. Caffeine use  Carbonated beverages. No drug use  Tobacco use  Never smoker.  Family History  Arthritis  Mother. Breast Cancer  Mother. Diabetes Mellitus  Father. Hypertension  Father, Mother. Ischemic Bowel Disease  Mother.  Pregnancy / Birth History Age at menarche  44 years. Age of menopause  16-55 Gravida  4 Irregular periods  Maternal age  76-20 Para  3  Other Problems  Anxiety Disorder  Arthritis  Asthma  Back Pain  Depression  Gastroesophageal Reflux Disease  High blood pressure  Hypercholesterolemia  Sleep Apnea   Vitals  07/02/2016 10:44 AM Weight: 208 lb Height: 62.5in Body Surface Area: 1.95 m Body Mass Index: 37.44 kg/m  Temp.: 43F  Pulse: 89 (Regular)  BP: 142/70 (Sitting, Left Arm, Standard)       Physical Exam  The physical exam findings are as follows: Note:General: Alert, overweight but well appearing African-American female, in no distress Skin: Warm and dry. Chronic hyperpigmentation consistent with monilial chronic infection beneath her large abdominal pannus. HEENT: No palpable masses or thyromegaly. Sclera nonicteric. Lymph nodes: No cervical, supraclavicular, or inguinal nodes palpable. Lungs: Breath sounds clear and equal. No wheezing or increased work of breathing. Cardiovascular: Regular rate and rhythm without murmer. No JVD or edema. Peripheral pulses intact. No carotid bruits. Abdomen: Nondistended. Soft and nontender. Large abdominal pannus with chronic monilial rash underneath. Well-healed low midline incision without hernias. Extremities: No edema or joint swelling or deformity. No chronic venous stasis changes. Neurologic: Alert and fully oriented. Gait normal. No focal weakness. Psychiatric: Normal mood and affect. Thought content appropriate with  normal judgement and insight    Assessment & Plan  GASTRIC BYPASS STATUS FOR OBESITY (Z98.84)  Impression: Doing very well following gastric bypass without complications identified. Excellent weight loss of 116 pounds. She has had improvement if not resolution in a number of comorbidities. She remains very motivated. We discussed diet exercise strategies going forward. PANNUS, ABDOMINAL (E65) Impression: She has a very large pannus with chronic monilial infection. This is her major complaint at present. I think she would have significant benefit from panniculectomy. She has required multiple rounds of treatment for this. We discussed options of plastic surgery referral versus our practice. She would prefer to stay here. We discussed that this is not a full body contouring and we likely would sacrifice her umbilicus. We discussed the nature of surgery and recovery as well as risks of wound healing issues, bleeding, infection. She understands and strongly desires to proceed. Current Plans Abdominal panniculectomy

## 2016-09-04 NOTE — Transfer of Care (Signed)
Immediate Anesthesia Transfer of Care Note  Patient: Lisa Todd  Procedure(s) Performed: Procedure(s): PANNICULECTOMY (Bilateral)  Patient Location: PACU  Anesthesia Type:General  Level of Consciousness: awake, alert , oriented and patient cooperative  Airway & Oxygen Therapy: Patient Spontanous Breathing and Patient connected to face mask oxygen  Post-op Assessment: Report given to RN, Post -op Vital signs reviewed and stable and Patient moving all extremities X 4  Post vital signs: stable  Last Vitals:  Vitals:   09/04/16 0543  BP: (!) 129/91  Pulse: 66  Resp: 16  Temp: 36.7 C    Last Pain:  Vitals:   09/04/16 0633  TempSrc:   PainSc: 10-Worst pain ever      Patients Stated Pain Goal: 4 (97/02/63 7858)  Complications: No apparent anesthesia complications

## 2016-09-04 NOTE — Interval H&P Note (Signed)
History and Physical Interval Note:  09/04/2016 7:30 AM  Lisa Todd  has presented today for surgery, with the diagnosis of Abdominal panniculus with infection  The various methods of treatment have been discussed with the patient and family. After consideration of risks, benefits and other options for treatment, the patient has consented to  Procedure(s): PANNICULECTOMY (N/A) as a surgical intervention .  The patient's history has been reviewed, patient examined, no change in status, stable for surgery.  I have reviewed the patient's chart and labs.  Questions were answered to the patient's satisfaction.     Giannamarie Paulus T

## 2016-09-04 NOTE — Anesthesia Postprocedure Evaluation (Signed)
Anesthesia Post Note  Patient: Lisa Todd  Procedure(s) Performed: Procedure(s) (LRB): PANNICULECTOMY (Bilateral)  Patient location during evaluation: Other Anesthesia Type: General Level of consciousness: awake and alert Pain management: pain level controlled Vital Signs Assessment: post-procedure vital signs reviewed and stable Respiratory status: spontaneous breathing, nonlabored ventilation, respiratory function stable and patient connected to nasal cannula oxygen Cardiovascular status: blood pressure returned to baseline and stable Postop Assessment: no signs of nausea or vomiting Anesthetic complications: no       Last Vitals:  Vitals:   09/04/16 1602 09/04/16 1700  BP: (!) 118/56 123/72  Pulse: 61 60  Resp: 10 12  Temp: 36.5 C 36.7 C    Last Pain:  Vitals:   09/04/16 1700  TempSrc: Oral  PainSc:                  Catalina Gravel

## 2016-09-05 LAB — GLUCOSE, CAPILLARY
GLUCOSE-CAPILLARY: 113 mg/dL — AB (ref 65–99)
GLUCOSE-CAPILLARY: 115 mg/dL — AB (ref 65–99)
Glucose-Capillary: 128 mg/dL — ABNORMAL HIGH (ref 65–99)
Glucose-Capillary: 108 mg/dL — ABNORMAL HIGH (ref 65–99)

## 2016-09-05 LAB — BASIC METABOLIC PANEL
ANION GAP: 7 (ref 5–15)
BUN: 15 mg/dL (ref 6–20)
CALCIUM: 8.3 mg/dL — AB (ref 8.9–10.3)
CO2: 23 mmol/L (ref 22–32)
CREATININE: 0.71 mg/dL (ref 0.44–1.00)
Chloride: 106 mmol/L (ref 101–111)
Glucose, Bld: 166 mg/dL — ABNORMAL HIGH (ref 65–99)
Potassium: 4.1 mmol/L (ref 3.5–5.1)
Sodium: 136 mmol/L (ref 135–145)

## 2016-09-05 LAB — CBC
HCT: 30 % — ABNORMAL LOW (ref 36.0–46.0)
Hemoglobin: 9.9 g/dL — ABNORMAL LOW (ref 12.0–15.0)
MCH: 29.5 pg (ref 26.0–34.0)
MCHC: 33 g/dL (ref 30.0–36.0)
MCV: 89.3 fL (ref 78.0–100.0)
PLATELETS: 176 10*3/uL (ref 150–400)
RBC: 3.36 MIL/uL — AB (ref 3.87–5.11)
RDW: 13.4 % (ref 11.5–15.5)
WBC: 8.3 10*3/uL (ref 4.0–10.5)

## 2016-09-05 MED ORDER — ALPRAZOLAM 1 MG PO TABS: 1.0000 mg | ORAL_TABLET | Freq: Four times a day (QID) | ORAL | Status: DC | PRN

## 2016-09-05 NOTE — Progress Notes (Signed)
1 Day Post-Op   Subjective/Chief Complaint: Sore, not oob much, voided, no n/v   Objective: Vital signs in last 24 hours: Temp:  [97.5 F (36.4 C)-98.7 F (37.1 C)] 98.5 F (36.9 C) (05/19 0500) Pulse Rate:  [52-65] 65 (05/19 0500) Resp:  [10-18] 18 (05/19 0500) BP: (103-163)/(47-78) 103/47 (05/19 0500) SpO2:  [99 %-100 %] 100 % (05/19 0500) Last BM Date: 09/02/16  Intake/Output from previous day: 05/18 0701 - 05/19 0700 In: 3860 [P.O.:810; I.V.:3050] Out: 476 [Urine:376; Blood:100] Intake/Output this shift: No intake/output data recorded.  rrr Clear bilaterally Soft wound vac in place approp tender   Lab Results:   Recent Labs  09/05/16 0508  WBC 8.3  HGB 9.9*  HCT 30.0*  PLT 176   BMET  Recent Labs  09/05/16 0508  NA 136  K 4.1  CL 106  CO2 23  GLUCOSE 166*  BUN 15  CREATININE 0.71  CALCIUM 8.3*   PT/INR No results for input(s): LABPROT, INR in the last 72 hours. ABG No results for input(s): PHART, HCO3 in the last 72 hours.  Invalid input(s): PCO2, PO2  Studies/Results: No results found.  Anti-infectives: Anti-infectives    Start     Dose/Rate Route Frequency Ordered Stop   09/04/16 0604  ceFAZolin (ANCEF) IVPB 2g/100 mL premix     2 g 200 mL/hr over 30 Minutes Intravenous On call to O.R. 09/04/16 0604 09/04/16 0810      Assessment/Plan: POD 1 panniculectomy  1. Attempt oral pain meds 2. Regular diet 3. sliv 4. oob today 5. lovenox 6. Dc tomorrow or Monday   [facility] 09/05/2016

## 2016-09-06 LAB — GLUCOSE, CAPILLARY
GLUCOSE-CAPILLARY: 143 mg/dL — AB (ref 65–99)
GLUCOSE-CAPILLARY: 77 mg/dL (ref 65–99)
Glucose-Capillary: 114 mg/dL — ABNORMAL HIGH (ref 65–99)
Glucose-Capillary: 135 mg/dL — ABNORMAL HIGH (ref 65–99)

## 2016-09-06 MED ORDER — DOCUSATE SODIUM 100 MG PO CAPS
100.0000 mg | ORAL_CAPSULE | Freq: Two times a day (BID) | ORAL | Status: DC
  Administered 2016-09-06 – 2016-09-07 (×3): 100 mg via ORAL
  Filled 2016-09-06 (×3): qty 1

## 2016-09-06 NOTE — Progress Notes (Signed)
Pt c/o pain in right anterior lower leg and lateral right lower leg.  Homans sign negative for bilateral LE's.  Small raised area noted to right anterior lower leg.  No redness or swelling noted to either lower extremity.

## 2016-09-06 NOTE — Progress Notes (Signed)
Upon initial assessment, pt c/o soreness on rt anterior lower legs noted by night shift RN.Marland Kitchen Homan's sign remains negative for both LE's. Small "bump" under skin mid RT shin unchanged with skin intact. No redness or swelling noted.

## 2016-09-06 NOTE — Progress Notes (Signed)
2 Days Post-Op   Subjective/Chief Complaint: No bm, no n/v, not eating much as doesn't like food, was oob yesterday, pain controlled, some anterior right lower leg pain   Objective: Vital signs in last 24 hours: Temp:  [97.8 F (36.6 C)-98.3 F (36.8 C)] 98 F (36.7 C) (05/20 0634) Pulse Rate:  [56-65] 56 (05/20 0634) Resp:  [14-17] 16 (05/20 0634) BP: (110-125)/(51-57) 110/57 (05/20 0634) SpO2:  [96 %-100 %] 100 % (05/20 0634) Last BM Date: 09/02/16  Intake/Output from previous day: 05/19 0701 - 05/20 0700 In: 750 [P.O.:750] Out: 1300 [Urine:1300] Intake/Output this shift: No intake/output data recorded.  Resp: clear to auscultation bilaterally Cardio: regular rate and rhythm GI: vac in place, approp tender bs present rle anterior at tibia has some mild tenderness, no infection  Lab Results:   Recent Labs  09/05/16 0508  WBC 8.3  HGB 9.9*  HCT 30.0*  PLT 176   BMET  Recent Labs  09/05/16 0508  NA 136  K 4.1  CL 106  CO2 23  GLUCOSE 166*  BUN 15  CREATININE 0.71  CALCIUM 8.3*    Anti-infectives: Anti-infectives    Start     Dose/Rate Route Frequency Ordered Stop   09/04/16 0604  ceFAZolin (ANCEF) IVPB 2g/100 mL premix     2 g 200 mL/hr over 30 Minutes Intravenous On call to O.R. 09/04/16 0604 09/04/16 0810      Assessment/Plan: POD 2 panniculectomy  1. Oral pain meds 2. Regular diet 3. sliv 4. Continue oob 5. lovenox 6. Area on right leg not c/w any concern for dvt 7. Dc tomorrow if continues to do well  [facility] 09/06/2016

## 2016-09-07 ENCOUNTER — Encounter (HOSPITAL_COMMUNITY): Payer: Self-pay | Admitting: General Surgery

## 2016-09-07 LAB — GLUCOSE, CAPILLARY
Glucose-Capillary: 109 mg/dL — ABNORMAL HIGH (ref 65–99)
Glucose-Capillary: 159 mg/dL — ABNORMAL HIGH (ref 65–99)

## 2016-09-07 MED ORDER — OXYCODONE HCL 5 MG PO TABS: 5.0000 mg | ORAL_TABLET | Freq: Four times a day (QID) | ORAL | 0 refills | Status: DC | PRN

## 2016-09-07 NOTE — Discharge Instructions (Signed)
[facility] - DISCHARGE INSTRUCTIONS TO PATIENT  Activity:  Driving - May drive in 5 days, if off pain meds   Lifting - No lifting more than 15 pounds, until seen by Dr. Excell Seltzer  Wound Care:   Has Provena on wound - to be seen next week for removal of wound vac  Diet:  Bariatric diet  Follow up appointment:  Call Dr. Pollie Friar office [facility]) at [phone] for an appointment in 7 - 14 days  Medications and dosages:  Resume your home medications.  You have a prescription for:  Percocet  Call Dr. Lucia Gaskins or his office  [phone]) if you have:  Temperature greater than 100.4,  Persistent nausea and vomiting,  Severe uncontrolled pain,  Redness, tenderness, or signs of infection (pain, swelling, redness, odor or green/yellow discharge around the site),  Difficulty breathing, headache or visual disturbances,  Any other questions or concerns you may have after discharge.  In an emergency, call 911 or go to an Emergency Department at a nearby hospital.

## 2016-09-07 NOTE — Progress Notes (Signed)
I also made an appointment for her to follow-up with a nurse at the end of the seven days allowed for her drain for May 29th at 10:30 with [facility].

## 2016-09-07 NOTE — Progress Notes (Signed)
Pt was requesting a new ab binder before discharge however, we could only locate one 1" smaller than the one she had.  She accepted this to take home.  D/C instructions were given and she said someone had already given her the prescription for her pain medicine.  Pt tolerating her diet and ambulating.  Pt was taken down in a wheelchair to be taken home with her daughter.

## 2016-09-07 NOTE — Discharge Summary (Signed)
Physician Discharge Summary  Patient ID:  Lisa Todd  MRN: [id number]  DOB/AGE: [birth year] 61 y.o.  Admit date: 09/04/2016 Discharge date: 09/07/2016  Discharge Diagnoses:  1.  Abdominal panniculus with infection 2.  Roux en Y gastric bypass - 06/24/2015 - [facility] 3.  HTN 4.  DM   Active Problems:   Symptomatic abdominal panniculus  Operation: Procedure(s):  PANNICULECTOMY on 09/04/2016 - [facility]  Discharged Condition: good  Hospital Course: Lisa Todd is an 61 y.o. female whose primary care physician is Lisa Evens, MD and who was admitted 09/04/2016 with a chief complaint of No chief complaint on file. Marland Kitchen   She was brought to the operating room on 09/04/2016 and underwent  PANNICULECTOMY.   She is now 3 days post op and ready for discharge. She will go home with a Provena wound VAC.  The discharge instructions were reviewed with the patient.  Consults: None  Significant Diagnostic Studies: Results for orders placed or performed during the hospital encounter of 09/04/16  Glucose, capillary  Result Value Ref Range   Glucose-Capillary 92 65 - 99 mg/dL   Comment 1 Notify RN    Comment 2 Document in Chart   Glucose, capillary  Result Value Ref Range   Glucose-Capillary 119 (H) 65 - 99 mg/dL   Comment 1 Notify RN   Glucose, capillary  Result Value Ref Range   Glucose-Capillary 158 (H) 65 - 99 mg/dL  Basic metabolic panel  Result Value Ref Range   Sodium 136 135 - 145 mmol/L   Potassium 4.1 3.5 - 5.1 mmol/L   Chloride 106 101 - 111 mmol/L   CO2 23 22 - 32 mmol/L   Glucose, Bld 166 (H) 65 - 99 mg/dL   BUN 15 6 - 20 mg/dL   Creatinine, Ser 0.71 0.44 - 1.00 mg/dL   Calcium 8.3 (L) 8.9 - 10.3 mg/dL   GFR calc non Af Amer >60 >60 mL/min   GFR calc Af Amer >60 >60 mL/min   Anion gap 7 5 - 15  CBC  Result Value Ref Range   WBC 8.3 4.0 - 10.5 K/uL   RBC 3.36 (L) 3.87 - 5.11 MIL/uL   Hemoglobin 9.9 (L) 12.0 - 15.0 g/dL   HCT 30.0 (L) 36.0 - 46.0 %   MCV 89.3  78.0 - 100.0 fL   MCH 29.5 26.0 - 34.0 pg   MCHC 33.0 30.0 - 36.0 g/dL   RDW 13.4 11.5 - 15.5 %   Platelets 176 150 - 400 K/uL  Glucose, capillary  Result Value Ref Range   Glucose-Capillary 143 (H) 65 - 99 mg/dL  Glucose, capillary  Result Value Ref Range   Glucose-Capillary 139 (H) 65 - 99 mg/dL  Glucose, capillary  Result Value Ref Range   Glucose-Capillary 113 (H) 65 - 99 mg/dL  Glucose, capillary  Result Value Ref Range   Glucose-Capillary 115 (H) 65 - 99 mg/dL  Glucose, capillary  Result Value Ref Range   Glucose-Capillary 128 (H) 65 - 99 mg/dL  Glucose, capillary  Result Value Ref Range   Glucose-Capillary 108 (H) 65 - 99 mg/dL  Glucose, capillary  Result Value Ref Range   Glucose-Capillary 114 (H) 65 - 99 mg/dL  Glucose, capillary  Result Value Ref Range   Glucose-Capillary 143 (H) 65 - 99 mg/dL  Glucose, capillary  Result Value Ref Range   Glucose-Capillary 77 65 - 99 mg/dL  Glucose, capillary  Result Value Ref Range   Glucose-Capillary 135 (H) 65 -  99 mg/dL  Glucose, capillary  Result Value Ref Range   Glucose-Capillary 109 (H) 65 - 99 mg/dL    No results found.  Discharge Exam:  Vitals:   09/06/16 2117 09/07/16 0502  BP: 99/66 (!) 118/50  Pulse: 69 67  Resp: 16 17  Temp: 97.6 F (36.4 C) 98 F (36.7 C)    General: Obese AA F who is alert and generally healthy appearing.  Lungs: Clear to auscultation and symmetric breath sounds. Heart:  RRR. No murmur or rub. Abdomen: Soft.  VAC on lower abdomen.  Discharge Medications:   Allergies as of 09/07/2016      Reactions   Liraglutide Other (See Comments)   Unknown. Patient is unaware of this allergy.       Medication List    TAKE these medications   ACAI PO Take 1 tablet by mouth daily.   ALPRAZolam 1 MG tablet Commonly known as:  XANAX Take 1 mg by mouth 4 (four) times daily as needed for anxiety or sleep.   aspirin EC 81 MG tablet Take 81 mg by mouth daily.   benzonatate 100 MG  capsule Commonly known as:  TESSALON Take 1 capsule (100 mg total) by mouth every 8 (eight) hours.   budesonide-formoterol 160-4.5 MCG/ACT inhaler Commonly known as:  SYMBICORT Inhale 2 puffs into the lungs 2 (two) times daily as needed.   CARDIZEM LA 360 MG 24 hr tablet Generic drug:  diltiazem Take 360 mg by mouth daily.   cephALEXin 500 MG capsule Commonly known as:  KEFLEX Take 1 capsule (500 mg total) by mouth 3 (three) times daily.   chlorpheniramine-HYDROcodone 10-8 MG/5ML Suer Commonly known as:  TUSSIONEX PENNKINETIC ER Take 5 mLs by mouth at bedtime as needed for cough.   cyclobenzaprine 10 MG tablet Commonly known as:  FLEXERIL Take 1 tablet (10 mg total) by mouth 3 (three) times daily as needed for muscle spasms.   diclofenac sodium 1 % Gel Commonly known as:  VOLTAREN Apply 1 application topically 3 (three) times daily as needed (pain). For arthritis pain   ibuprofen 800 MG tablet Commonly known as:  ADVIL,MOTRIN Take 800 mg by mouth daily as needed for moderate pain.   insulin glargine 100 UNIT/ML injection Commonly known as:  LANTUS Inject 20 Units into the skin at bedtime.   JANUVIA 100 MG tablet Generic drug:  sitaGLIPtin Take 100 mg by mouth daily.   meloxicam 15 MG tablet Commonly known as:  MOBIC Take 15 mg by mouth daily.   multivitamin with minerals Tabs tablet Take 1 tablet by mouth daily.   ondansetron 4 MG tablet Commonly known as:  ZOFRAN Take 1 tablet (4 mg total) by mouth every 6 (six) hours. What changed:  when to take this   oxyCODONE 5 MG immediate release tablet Commonly known as:  Oxy IR/ROXICODONE Take 1-2 tablets (5-10 mg total) by mouth every 6 (six) hours as needed for moderate pain.   oxyCODONE-acetaminophen 10-325 MG tablet Commonly known as:  PERCOCET Take 1 tablet by mouth every 6 (six) hours as needed for pain.   phenazopyridine 200 MG tablet Commonly known as:  PYRIDIUM Take 1 tablet (200 mg total) by mouth 3  (three) times daily.   phentermine 37.5 MG capsule Take 37.5 mg by mouth every morning. Patient stated last dose on 08/31/16 per patient   polyethylene glycol packet Commonly known as:  MIRALAX / GLYCOLAX Take 17 g by mouth daily as needed for mild constipation.   pravastatin  80 MG tablet Commonly known as:  PRAVACHOL Take 80 mg by mouth every evening.   PROAIR HFA 108 (90 Base) MCG/ACT inhaler Generic drug:  albuterol USE 2 PUFFS EVERY 6 TO 8 HOURS AS NEEDED FOR WHEEZING. What changed:  Another medication with the same name was changed. Make sure you understand how and when to take each.   albuterol 108 (90 Base) MCG/ACT inhaler Commonly known as:  PROVENTIL HFA;VENTOLIN HFA Inhale 1-2 puffs into the lungs every 6 (six) hours as needed for wheezing. What changed:  how much to take       Disposition: 01-Home or Self Care  Discharge Instructions    Diet - low sodium heart healthy    Complete by:  As directed    Increase activity slowly    Complete by:  As directed      Activity:  Driving - May drive in 5 days, if off pain meds   Lifting - No lifting more than 15 pounds, until seen by Dr. Excell Seltzer  Wound Care:   Has Provena on wound - to be seen next week for removal of wound vac  Diet:  Bariatric diet  Follow up appointment:  Call Dr. Pollie Friar office [facility]) at [phone] for an appointment in 7 - 14 days  Medications and dosages:  Resume your home medications.  You have a prescription for:  Percocet   Signed: Alphonsa Overall, M.D., [facility] Office:  [phone]  09/07/2016, 10:12 AM

## 2017-02-13 IMAGING — DX DG KNEE COMPLETE 4+V*L*
4 series · 4 of 4 positions shown · non-contrast
Comparison: 12/10/2011.

CLINICAL DATA: Chronic bilateral knee pain.  No known injury.

EXAM:
LEFT KNEE - COMPLETE 4+ VIEW

[t knee lat left]
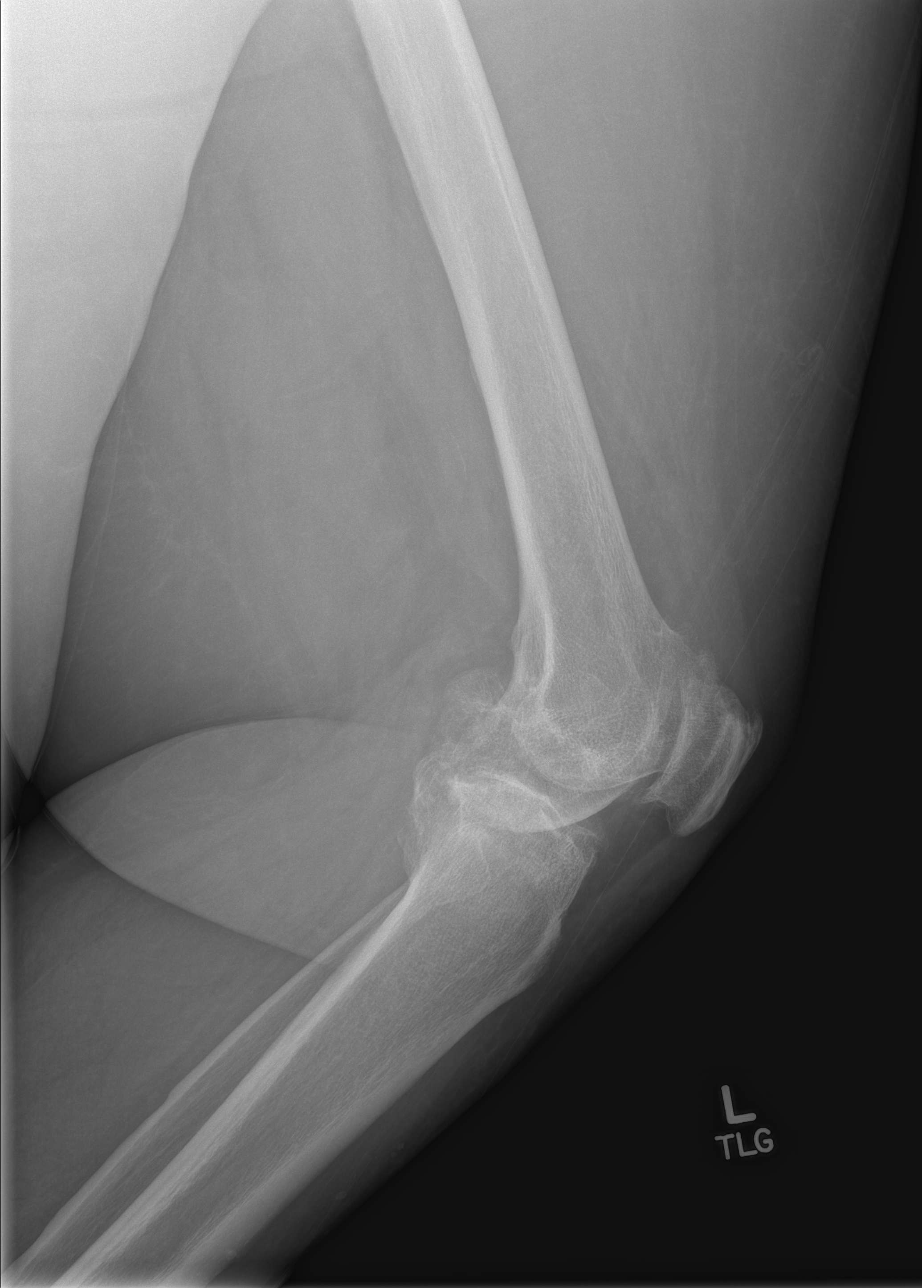

[t knee obl left (1 of 2)]
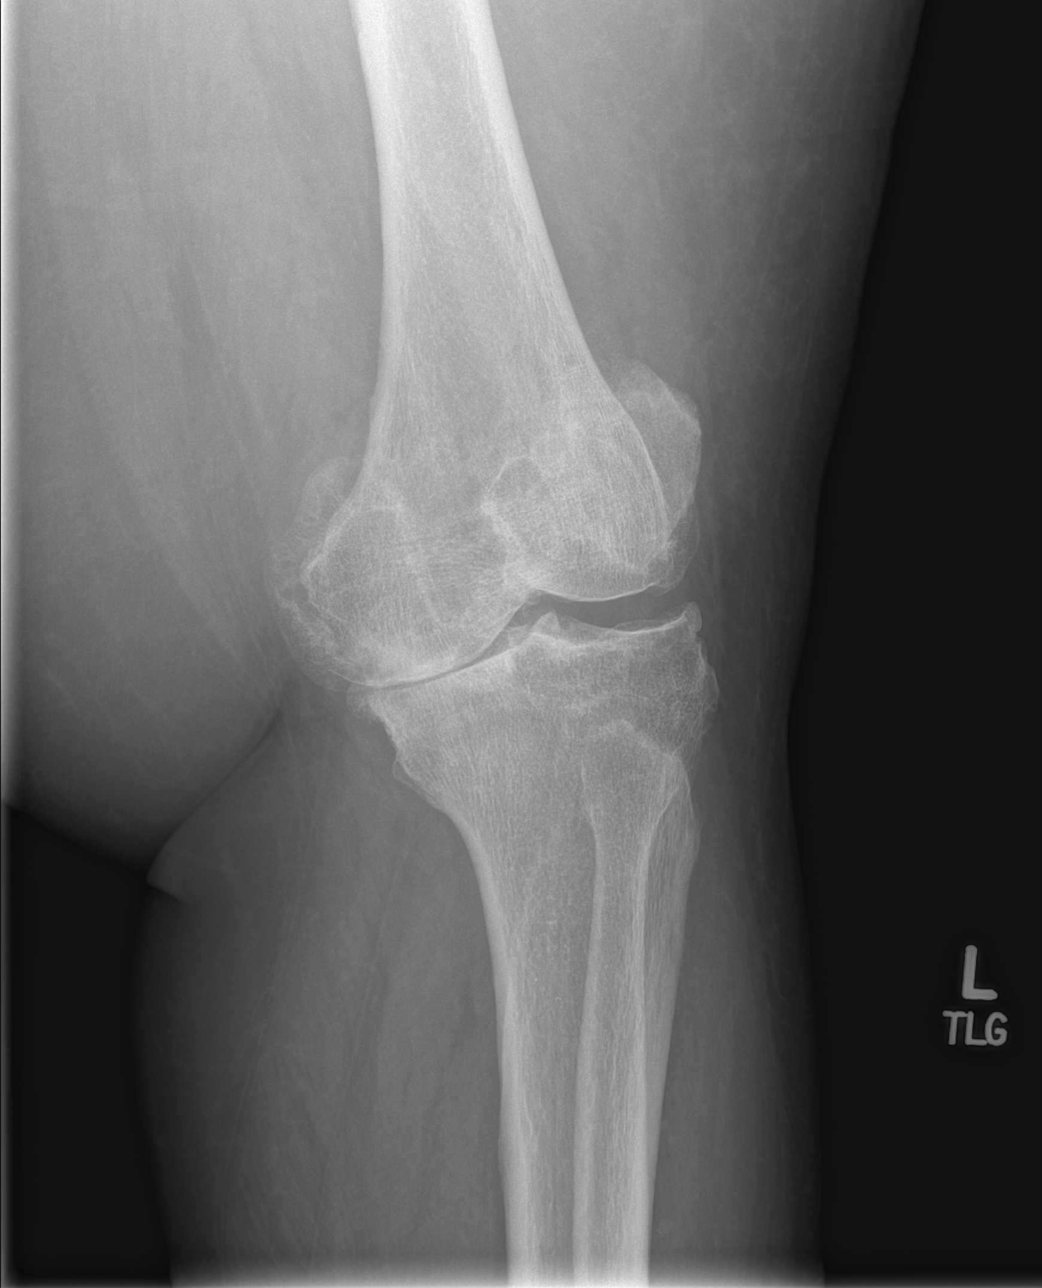

[t knee ap left]
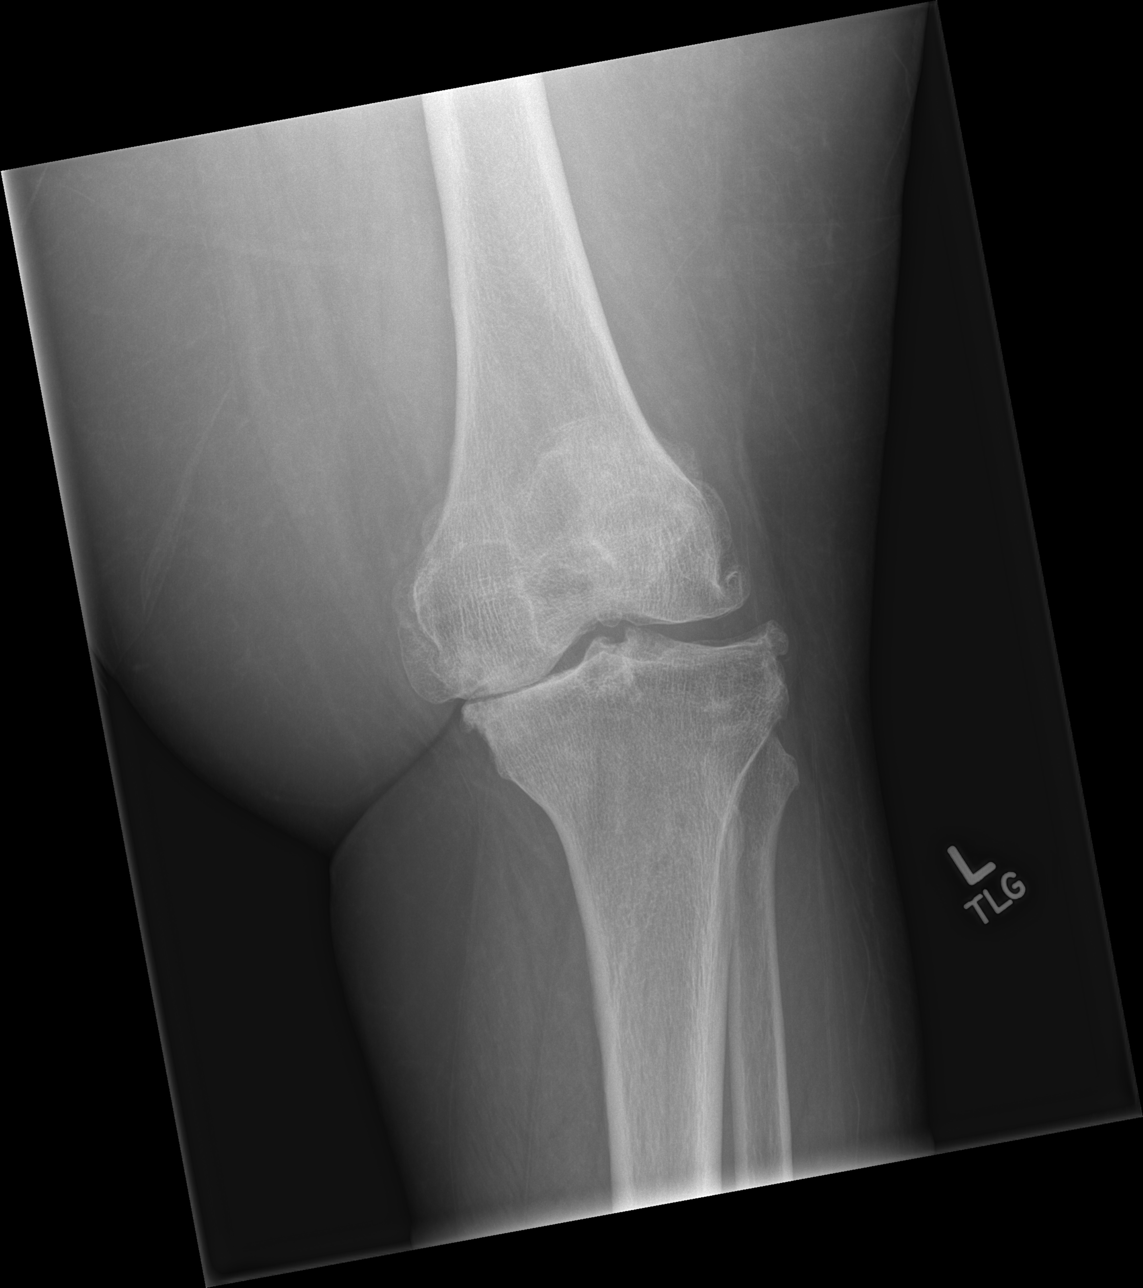

[t knee obl left (2 of 2)]
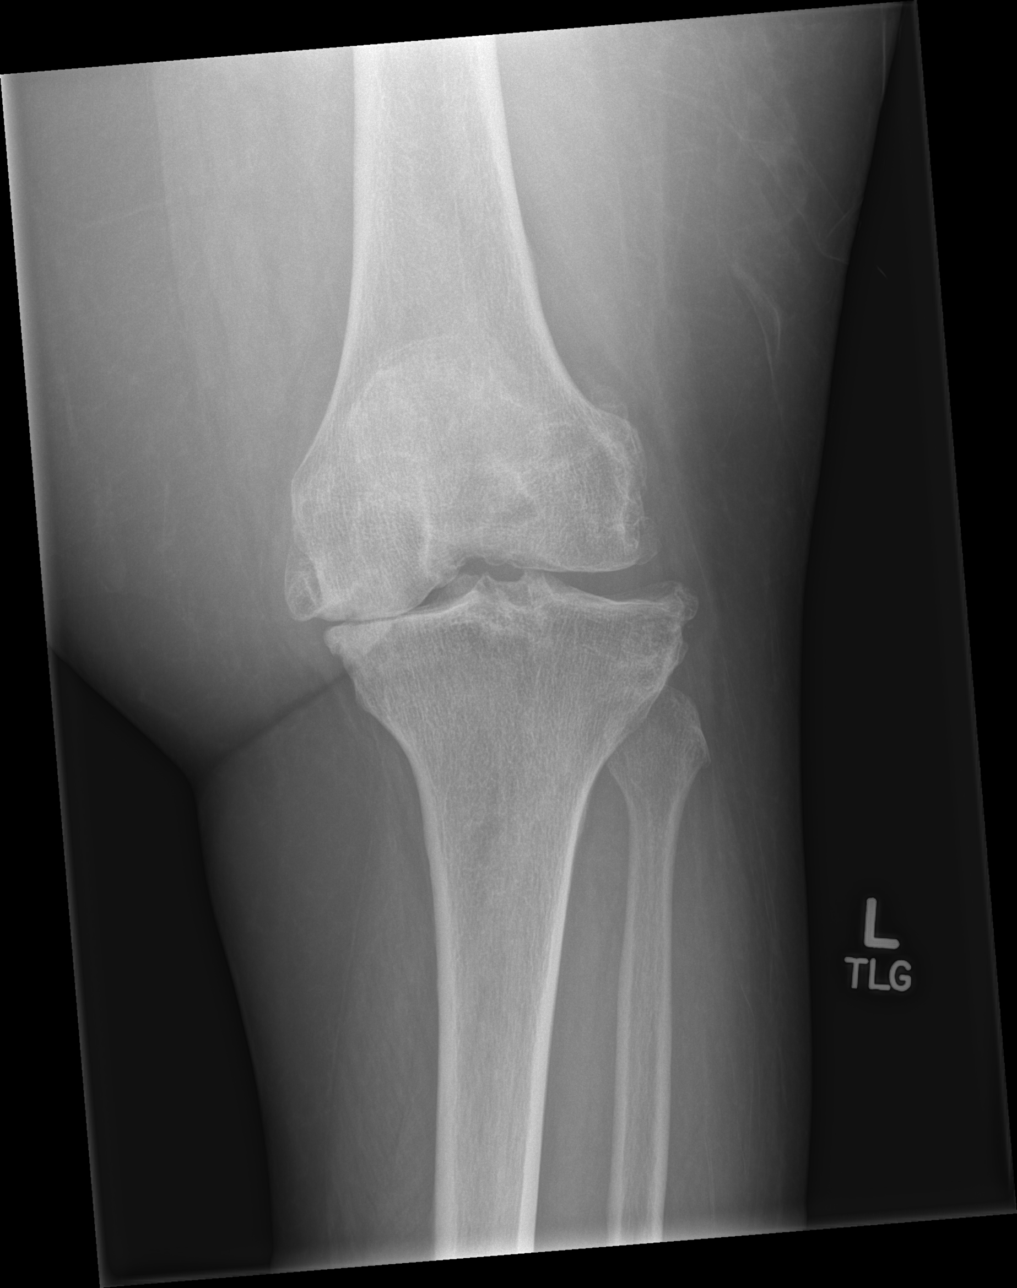

[4 of 4 positions shown; findings below may reference images not displayed]

FINDINGS: Severe tricompartmental osteoarthritis is again noted with
bone-on-bone apposition and subchondral eburnation in the medial
compartment. There are large tricompartmental osteophytes. No
evidence of acute fracture, dislocation, definite joint effusion or
loose body.
IMPRESSION: No acute osseous findings. Advanced tricompartmental osteoarthritis.

## 2017-06-19 ENCOUNTER — Emergency Department (HOSPITAL_COMMUNITY): Payer: Medicare Other

## 2017-06-19 ENCOUNTER — Emergency Department (HOSPITAL_COMMUNITY)
Admission: EM | Admit: 2017-06-19 | Discharge: 2017-06-19 | Disposition: A | Discharge status: Home / Self Care | Payer: Medicare Other | Attending: Emergency Medicine | Admitting: Emergency Medicine

## 2017-06-19 ENCOUNTER — Encounter (HOSPITAL_COMMUNITY): Payer: Self-pay | Admitting: *Deleted

## 2017-06-19 ENCOUNTER — Other Ambulatory Visit: Payer: Self-pay

## 2017-06-19 DIAGNOSIS — Y9301 Activity, walking, marching and hiking: Secondary | ICD-10-CM | POA: Diagnosis not present

## 2017-06-19 DIAGNOSIS — Z79899 Other long term (current) drug therapy: Secondary | ICD-10-CM | POA: Insufficient documentation

## 2017-06-19 DIAGNOSIS — Y999 Unspecified external cause status: Secondary | ICD-10-CM | POA: Insufficient documentation

## 2017-06-19 DIAGNOSIS — I10 Essential (primary) hypertension: Secondary | ICD-10-CM | POA: Insufficient documentation

## 2017-06-19 DIAGNOSIS — S92515A Nondisplaced fracture of proximal phalanx of left lesser toe(s), initial encounter for closed fracture: Secondary | ICD-10-CM | POA: Diagnosis not present

## 2017-06-19 DIAGNOSIS — S99922A Unspecified injury of left foot, initial encounter: Secondary | ICD-10-CM | POA: Diagnosis present

## 2017-06-19 DIAGNOSIS — E119 Type 2 diabetes mellitus without complications: Secondary | ICD-10-CM | POA: Insufficient documentation

## 2017-06-19 DIAGNOSIS — J45909 Unspecified asthma, uncomplicated: Secondary | ICD-10-CM | POA: Diagnosis not present

## 2017-06-19 DIAGNOSIS — Y929 Unspecified place or not applicable: Secondary | ICD-10-CM | POA: Diagnosis not present

## 2017-06-19 DIAGNOSIS — Z794 Long term (current) use of insulin: Secondary | ICD-10-CM | POA: Diagnosis not present

## 2017-06-19 DIAGNOSIS — Y939 Activity, unspecified: Secondary | ICD-10-CM | POA: Diagnosis not present

## 2017-06-19 DIAGNOSIS — S92505A Nondisplaced unspecified fracture of left lesser toe(s), initial encounter for closed fracture: Secondary | ICD-10-CM

## 2017-06-19 DIAGNOSIS — W2209XA Striking against other stationary object, initial encounter: Secondary | ICD-10-CM | POA: Diagnosis not present

## 2017-06-19 DIAGNOSIS — Z7982 Long term (current) use of aspirin: Secondary | ICD-10-CM | POA: Diagnosis not present

## 2017-06-19 MED ORDER — TRAMADOL HCL 50 MG PO TABS: 50.0000 mg | ORAL_TABLET | Freq: Four times a day (QID) | ORAL | 0 refills | Status: DC | PRN

## 2017-06-19 MED ORDER — HYDROCODONE-ACETAMINOPHEN 5-325 MG PO TABS: 1.0000 | ORAL_TABLET | ORAL | 0 refills | Status: DC | PRN
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
## 2017-06-19 NOTE — ED Notes (Signed)
Patient was given a prepackage of Hydrocodone/Acetaminophen and instructions on use, patient verbally understands.

## 2017-06-19 NOTE — ED Triage Notes (Signed)
Pt states she stumped her toes yesterday & cant take the pain anymore.

## 2017-06-21 MED FILL — Hydrocodone-Acetaminophen Tab 5-325 MG: ORAL | Qty: 6 | Status: AC

## 2017-06-23 ENCOUNTER — Encounter: Payer: Self-pay | Admitting: Orthopedic Surgery

## 2017-06-23 ENCOUNTER — Ambulatory Visit: Payer: Self-pay | Admitting: Orthopedic Surgery

## 2017-07-07 ENCOUNTER — Ambulatory Visit (INDEPENDENT_AMBULATORY_CARE_PROVIDER_SITE_OTHER): Payer: Medicare Other | Admitting: Orthopedic Surgery

## 2017-07-07 VITALS — BP 135/89 | HR 80 | Ht 63.0 in | Wt 172.0 lb

## 2017-07-07 DIAGNOSIS — S92515A Nondisplaced fracture of proximal phalanx of left lesser toe(s), initial encounter for closed fracture: Secondary | ICD-10-CM | POA: Diagnosis not present

## 2017-07-07 NOTE — Progress Notes (Signed)
  NEW PATIENT OFFICE VISIT   Chief Complaint  Patient presents with  . Foot Injury    Left foot fracture, DOI 06-19-17.    Left foot  Dull pain left foot  18 days  Assoc with d/w wb     Review of Systems  Constitutional: Positive for weight loss. Negative for chills and fever.  Neurological: Negative for tingling.     Past Medical History:  Diagnosis Date  . Arthritis   . Asthma   . Chronic back pain   . Depression   . Diabetes mellitus   . Difficult intravenous access    required ultrasound for placement  . GERD (gastroesophageal reflux disease)    hx of   . Hyperlipidemia   . Hypertension   . IDDM (insulin dependent diabetes mellitus) ([place])    type II   . Obesity   . Sleep apnea    uses O2 2 liters  / currently has no cpap     Past Surgical History:  Procedure Laterality Date  . ABDOMINAL HYSTERECTOMY    . BLADDER REPAIR W/ CESAREAN SECTION  1989  . GASTRIC ROUX-EN-Y N/A 06/24/2015   Procedure: LAPAROSCOPIC ROUX-EN-Y GASTRIC BYPASS WITH UPPER ENDOSCOPY;  Surgeon: Excell Seltzer, MD;  Location: [facility];  Service: General;  Laterality: N/A;  . knee left arthroscopy  2004  . PANNICULECTOMY Bilateral 09/04/2016   Procedure: PANNICULECTOMY;  Surgeon: Excell Seltzer, MD;  Location: [facility];  Service: General;  Laterality: Bilateral;  . right knee surgery  2007  . right rotator cuff surgery  11/26/2009  . VESICOVAGINAL FISTULA CLOSURE W/ TAH  2002    Family History  Problem Relation Age of Onset  . Breast cancer Mother   . Diabetes Mother   . Hypertension Mother   . Asthma Father   . Diabetes Father   . Hypertension Father   . Diabetes Brother   . Hypertension Brother   . Diabetes Sister   . Hypertension Sister    Social History   Tobacco Use  . Smoking status: Never Smoker  . Smokeless tobacco: Never Used  Substance Use Topics  . Alcohol use: No  . Drug use: No    No outpatient medications have been marked as taking for the 07/07/17 encounter  (Office Visit) with Lisa Civil, MD.    BP 135/89   Pulse 80   Ht 5\' 3"  (1.6 m)   Wt 172 lb (78 kg)   BMI 30.47 kg/m   Physical Exam  Constitutional: She is oriented to person, place, and time. She appears well-developed and well-nourished.  Neurological: She is alert and oriented to person, place, and time. Gait abnormal.  Psychiatric: She has a normal mood and affect. Judgment normal.  Vitals reviewed.   Ortho Exam Left foot tenderness third and fourth digit normal range of motion normal stability no atrophy skin normal pulse normal sensation normal   MEDICAL DECISION SECTION  xrays ordered?  No  My independent reading of xrays: The third digit has a proximal phalanx fracture it involves the shaft the fourth digit has a proximal metaphyseal fracture of the phalanx   Encounter Diagnosis  Name Primary?  . Closed nondisplaced fracture of proximal phalanx of lesser toe of left foot, initial encounter 3rd and 4th toe Yes     PLAN:   Fracture third proximal phalanx fourth proximal phalanx  Postop shoe  Return for 3 weeks for x-ray

## 2017-07-26 DIAGNOSIS — S92919A Unspecified fracture of unspecified toe(s), initial encounter for closed fracture: Secondary | ICD-10-CM | POA: Insufficient documentation

## 2017-07-28 ENCOUNTER — Encounter: Payer: Self-pay | Admitting: Orthopedic Surgery

## 2017-07-28 ENCOUNTER — Ambulatory Visit: Payer: Medicare Other | Admitting: Orthopedic Surgery

## 2017-12-10 ENCOUNTER — Other Ambulatory Visit: Payer: Self-pay

## 2017-12-10 ENCOUNTER — Emergency Department (HOSPITAL_COMMUNITY)
Admission: EM | Admit: 2017-12-10 | Discharge: 2017-12-10 | Disposition: A | Discharge status: Home / Self Care | Payer: Medicare Other | Attending: Emergency Medicine | Admitting: Emergency Medicine

## 2017-12-10 ENCOUNTER — Encounter (HOSPITAL_COMMUNITY): Payer: Self-pay | Admitting: *Deleted

## 2017-12-10 DIAGNOSIS — S0501XA Injury of conjunctiva and corneal abrasion without foreign body, right eye, initial encounter: Secondary | ICD-10-CM | POA: Diagnosis not present

## 2017-12-10 DIAGNOSIS — K047 Periapical abscess without sinus: Secondary | ICD-10-CM | POA: Diagnosis not present

## 2017-12-10 DIAGNOSIS — X58XXXA Exposure to other specified factors, initial encounter: Secondary | ICD-10-CM | POA: Diagnosis not present

## 2017-12-10 DIAGNOSIS — Y939 Activity, unspecified: Secondary | ICD-10-CM | POA: Insufficient documentation

## 2017-12-10 DIAGNOSIS — S0502XA Injury of conjunctiva and corneal abrasion without foreign body, left eye, initial encounter: Secondary | ICD-10-CM | POA: Insufficient documentation

## 2017-12-10 DIAGNOSIS — E119 Type 2 diabetes mellitus without complications: Secondary | ICD-10-CM | POA: Insufficient documentation

## 2017-12-10 DIAGNOSIS — Z7982 Long term (current) use of aspirin: Secondary | ICD-10-CM | POA: Diagnosis not present

## 2017-12-10 DIAGNOSIS — J45909 Unspecified asthma, uncomplicated: Secondary | ICD-10-CM | POA: Insufficient documentation

## 2017-12-10 DIAGNOSIS — Y929 Unspecified place or not applicable: Secondary | ICD-10-CM | POA: Diagnosis not present

## 2017-12-10 DIAGNOSIS — Y999 Unspecified external cause status: Secondary | ICD-10-CM | POA: Insufficient documentation

## 2017-12-10 DIAGNOSIS — Z794 Long term (current) use of insulin: Secondary | ICD-10-CM | POA: Insufficient documentation

## 2017-12-10 DIAGNOSIS — S0500XA Injury of conjunctiva and corneal abrasion without foreign body, unspecified eye, initial encounter: Secondary | ICD-10-CM

## 2017-12-10 DIAGNOSIS — H5713 Ocular pain, bilateral: Secondary | ICD-10-CM | POA: Diagnosis present

## 2017-12-10 DIAGNOSIS — I10 Essential (primary) hypertension: Secondary | ICD-10-CM | POA: Diagnosis not present

## 2017-12-10 DIAGNOSIS — Z79899 Other long term (current) drug therapy: Secondary | ICD-10-CM | POA: Diagnosis not present

## 2017-12-10 MED ORDER — AMOXICILLIN 500 MG PO CAPS: 500.0000 mg | ORAL_CAPSULE | Freq: Three times a day (TID) | ORAL | 0 refills | Status: AC

## 2017-12-10 MED ORDER — FLUORESCEIN SODIUM 1 MG OP STRP
1.0000 | ORAL_STRIP | Freq: Once | OPHTHALMIC | Status: AC
  Administered 2017-12-10: 1 via OPHTHALMIC
  Filled 2017-12-10: qty 1

## 2017-12-10 MED ORDER — AMOXICILLIN 250 MG PO CAPS
500.0000 mg | ORAL_CAPSULE | Freq: Once | ORAL | Status: AC
  Administered 2017-12-10: 500 mg via ORAL
  Filled 2017-12-10: qty 2

## 2017-12-10 MED ORDER — TETRACAINE HCL 0.5 % OP SOLN
2.0000 [drp] | Freq: Once | OPHTHALMIC | Status: AC
  Administered 2017-12-10: 2 [drp] via OPHTHALMIC
  Filled 2017-12-10: qty 4

## 2017-12-10 MED ORDER — TOBRAMYCIN 0.3 % OP SOLN
1.0000 [drp] | Freq: Once | OPHTHALMIC | Status: AC
  Administered 2017-12-10: 1 [drp] via OPHTHALMIC
  Filled 2017-12-10: qty 5

## 2017-12-10 NOTE — Discharge Instructions (Addendum)
It is very possible that the glitter eyeshadow you used caused your corneal abrasions.  I advise you to avoid using this product.  Apply 1 drop of the antibiotics given in each eye every 4 hours while awake for the next 7 days.  Avoid rubbing your eyes - use cool compresses for irritation relief.  Call the eye doctor listed above if not improving over the next 3 days with this treatment.  Get rechecked sooner (here) for any worsened symptoms.

## 2017-12-10 NOTE — ED Notes (Signed)
L 20/25  R 20/25  Bilateral 20/20

## 2017-12-10 NOTE — ED Triage Notes (Signed)
Pt c/o bilateral eye pain; pt denies any drainage; pt states she has some trouble seeing out of both eyes
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
## 2018-10-03 ENCOUNTER — Other Ambulatory Visit: Payer: Self-pay

## 2018-10-03 DIAGNOSIS — Z20822 Contact with and (suspected) exposure to covid-19: Secondary | ICD-10-CM

## 2018-10-03 NOTE — Progress Notes (Signed)
[id number] 

## 2018-10-06 ENCOUNTER — Encounter (HOSPITAL_COMMUNITY): Payer: Self-pay | Admitting: Emergency Medicine

## 2018-10-06 ENCOUNTER — Other Ambulatory Visit: Payer: Self-pay

## 2018-10-06 ENCOUNTER — Emergency Department (HOSPITAL_COMMUNITY): Payer: Medicare Other

## 2018-10-06 ENCOUNTER — Emergency Department (HOSPITAL_COMMUNITY)
Admission: EM | Admit: 2018-10-06 | Discharge: 2018-10-06 | Disposition: A | Discharge status: Home / Self Care | Payer: Medicare Other | Attending: Emergency Medicine | Admitting: Emergency Medicine

## 2018-10-06 DIAGNOSIS — E119 Type 2 diabetes mellitus without complications: Secondary | ICD-10-CM | POA: Insufficient documentation

## 2018-10-06 DIAGNOSIS — Y999 Unspecified external cause status: Secondary | ICD-10-CM | POA: Diagnosis not present

## 2018-10-06 DIAGNOSIS — S60051A Contusion of right little finger without damage to nail, initial encounter: Secondary | ICD-10-CM | POA: Diagnosis not present

## 2018-10-06 DIAGNOSIS — S3992XA Unspecified injury of lower back, initial encounter: Secondary | ICD-10-CM | POA: Diagnosis present

## 2018-10-06 DIAGNOSIS — S39012A Strain of muscle, fascia and tendon of lower back, initial encounter: Secondary | ICD-10-CM | POA: Insufficient documentation

## 2018-10-06 DIAGNOSIS — J45909 Unspecified asthma, uncomplicated: Secondary | ICD-10-CM | POA: Insufficient documentation

## 2018-10-06 DIAGNOSIS — Y92410 Unspecified street and highway as the place of occurrence of the external cause: Secondary | ICD-10-CM | POA: Insufficient documentation

## 2018-10-06 DIAGNOSIS — M25562 Pain in left knee: Secondary | ICD-10-CM | POA: Diagnosis not present

## 2018-10-06 DIAGNOSIS — Y9389 Activity, other specified: Secondary | ICD-10-CM | POA: Insufficient documentation

## 2018-10-06 DIAGNOSIS — I10 Essential (primary) hypertension: Secondary | ICD-10-CM | POA: Insufficient documentation

## 2018-10-06 LAB — NOVEL CORONAVIRUS, NAA: SARS-CoV-2, NAA: NOT DETECTED

## 2018-10-06 MED ORDER — TIZANIDINE HCL 2 MG PO TABS: 2.0000 mg | ORAL_TABLET | Freq: Four times a day (QID) | ORAL | 0 refills | Status: DC | PRN

## 2018-10-06 MED ORDER — DICLOFENAC SODIUM 1 % TD GEL: 2.0000 g | Freq: Four times a day (QID) | TRANSDERMAL | 0 refills | Status: DC | PRN
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
## 2018-10-06 NOTE — Discharge Instructions (Signed)

## 2018-10-11 ENCOUNTER — Emergency Department (HOSPITAL_COMMUNITY): Payer: Medicare Other

## 2018-10-11 ENCOUNTER — Encounter (HOSPITAL_COMMUNITY): Payer: Self-pay | Admitting: Emergency Medicine

## 2018-10-11 ENCOUNTER — Emergency Department (HOSPITAL_COMMUNITY)
Admission: EM | Admit: 2018-10-11 | Discharge: 2018-10-11 | Disposition: A | Discharge status: Home / Self Care | Payer: Medicare Other | Attending: Emergency Medicine | Admitting: Emergency Medicine

## 2018-10-11 ENCOUNTER — Other Ambulatory Visit: Payer: Self-pay

## 2018-10-11 DIAGNOSIS — Z20828 Contact with and (suspected) exposure to other viral communicable diseases: Secondary | ICD-10-CM | POA: Diagnosis not present

## 2018-10-11 DIAGNOSIS — B349 Viral infection, unspecified: Secondary | ICD-10-CM | POA: Diagnosis not present

## 2018-10-11 DIAGNOSIS — R509 Fever, unspecified: Secondary | ICD-10-CM | POA: Diagnosis present

## 2018-10-11 DIAGNOSIS — Z7982 Long term (current) use of aspirin: Secondary | ICD-10-CM | POA: Diagnosis not present

## 2018-10-11 DIAGNOSIS — R05 Cough: Secondary | ICD-10-CM | POA: Insufficient documentation

## 2018-10-11 DIAGNOSIS — I10 Essential (primary) hypertension: Secondary | ICD-10-CM | POA: Insufficient documentation

## 2018-10-11 DIAGNOSIS — Z79899 Other long term (current) drug therapy: Secondary | ICD-10-CM | POA: Insufficient documentation

## 2018-10-11 DIAGNOSIS — E119 Type 2 diabetes mellitus without complications: Secondary | ICD-10-CM | POA: Insufficient documentation

## 2018-10-11 DIAGNOSIS — J45909 Unspecified asthma, uncomplicated: Secondary | ICD-10-CM | POA: Insufficient documentation

## 2018-10-11 LAB — URINALYSIS, ROUTINE W REFLEX MICROSCOPIC
Bilirubin Urine: NEGATIVE
Glucose, UA: NEGATIVE mg/dL
Hgb urine dipstick: NEGATIVE
Ketones, ur: NEGATIVE mg/dL
Leukocytes,Ua: NEGATIVE
Nitrite: NEGATIVE
Protein, ur: NEGATIVE mg/dL
Specific Gravity, Urine: 1.02 (ref 1.005–1.030)
pH: 6 (ref 5.0–8.0)

## 2018-10-11 LAB — COMPREHENSIVE METABOLIC PANEL
ALT: 46 U/L — ABNORMAL HIGH (ref 0–44)
AST: 28 U/L (ref 15–41)
Albumin: 3.4 g/dL — ABNORMAL LOW (ref 3.5–5.0)
Alkaline Phosphatase: 41 U/L (ref 38–126)
Anion gap: 10 (ref 5–15)
BUN: 14 mg/dL (ref 8–23)
CO2: 24 mmol/L (ref 22–32)
Calcium: 8.2 mg/dL — ABNORMAL LOW (ref 8.9–10.3)
Chloride: 104 mmol/L (ref 98–111)
Creatinine, Ser: 0.66 mg/dL (ref 0.44–1.00)
GFR calc Af Amer: >60 mL/min (ref >60)
GFR calc non Af Amer: >60 mL/min (ref >60)
Glucose, Bld: 144 mg/dL — ABNORMAL HIGH (ref 70–99)
Potassium: 3.3 mmol/L — ABNORMAL LOW (ref 3.5–5.1)
Sodium: 138 mmol/L (ref 135–145)
Total Bilirubin: 0.4 mg/dL (ref 0.3–1.2)
Total Protein: 6.3 g/dL — ABNORMAL LOW (ref 6.5–8.1)

## 2018-10-11 LAB — CBC WITH DIFFERENTIAL/PLATELET
Abs Immature Granulocytes: 0 10*3/uL (ref 0.00–0.07)
Basophils Absolute: 0 10*3/uL (ref 0.0–0.1)
Basophils Relative: 0 %
Eosinophils Absolute: 0.1 10*3/uL (ref 0.0–0.5)
Eosinophils Relative: 2 %
HCT: 37.7 % (ref 36.0–46.0)
Hemoglobin: 11.8 g/dL — ABNORMAL LOW (ref 12.0–15.0)
Immature Granulocytes: 0 %
Lymphocytes Relative: 37 %
Lymphs Abs: 1.8 10*3/uL (ref 0.7–4.0)
MCH: 29.9 pg (ref 26.0–34.0)
MCHC: 31.3 g/dL (ref 30.0–36.0)
MCV: 95.4 fL (ref 80.0–100.0)
Monocytes Absolute: 0.4 10*3/uL (ref 0.1–1.0)
Monocytes Relative: 8 %
Neutro Abs: 2.6 10*3/uL (ref 1.7–7.7)
Neutrophils Relative %: 53 %
Platelets: 152 10*3/uL (ref 150–400)
RBC: 3.95 MIL/uL (ref 3.87–5.11)
RDW: 12.9 % (ref 11.5–15.5)
WBC: 4.9 10*3/uL (ref 4.0–10.5)
nRBC: 0 % (ref 0.0–0.2)

## 2018-10-11 MED ORDER — ACETAMINOPHEN 500 MG PO TABS
1000.0000 mg | ORAL_TABLET | Freq: Once | ORAL | Status: AC
  Administered 2018-10-11: 18:00:00 1000 mg via ORAL
  Filled 2018-10-11: qty 2

## 2018-10-11 MED ORDER — POTASSIUM CHLORIDE CRYS ER 20 MEQ PO TBCR
40.0000 meq | EXTENDED_RELEASE_TABLET | Freq: Once | ORAL | Status: AC
  Administered 2018-10-11: 40 meq via ORAL
  Filled 2018-10-11: qty 2

## 2018-10-11 NOTE — Discharge Instructions (Signed)
Stay at home quarantine until you get your test back.  They should be back in 2 days

## 2018-10-11 NOTE — ED Triage Notes (Signed)
Patient complaining of fever of 103, cough, vomiting, and fatigue since yesterday. States there have been several cases of Covid at her workplace. States she took tylenol at 1230 today.
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
## 2018-10-11 NOTE — ED Notes (Signed)
Patient requesting medication for pain. Advised Dr Roderic Palau, verbal order for tylenol to be given.

## 2018-10-12 LAB — NOVEL CORONAVIRUS, NAA (HOSP ORDER, SEND-OUT TO REF LAB; TAT 18-24 HRS): SARS-CoV-2, NAA: NOT DETECTED

## 2019-02-04 ENCOUNTER — Encounter (HOSPITAL_COMMUNITY): Payer: Self-pay | Admitting: Emergency Medicine

## 2019-02-04 ENCOUNTER — Emergency Department (HOSPITAL_COMMUNITY): Payer: Medicare Other

## 2019-02-04 ENCOUNTER — Other Ambulatory Visit: Payer: Self-pay

## 2019-02-04 ENCOUNTER — Emergency Department (HOSPITAL_COMMUNITY)
Admission: EM | Admit: 2019-02-04 | Discharge: 2019-02-04 | Disposition: A | Discharge status: Home / Self Care | Payer: Medicare Other | Attending: Emergency Medicine | Admitting: Emergency Medicine

## 2019-02-04 DIAGNOSIS — M549 Dorsalgia, unspecified: Secondary | ICD-10-CM | POA: Diagnosis present

## 2019-02-04 DIAGNOSIS — S40012A Contusion of left shoulder, initial encounter: Secondary | ICD-10-CM | POA: Diagnosis not present

## 2019-02-04 DIAGNOSIS — S80212A Abrasion, left knee, initial encounter: Secondary | ICD-10-CM | POA: Diagnosis not present

## 2019-02-04 DIAGNOSIS — E119 Type 2 diabetes mellitus without complications: Secondary | ICD-10-CM | POA: Diagnosis not present

## 2019-02-04 DIAGNOSIS — W0110XA Fall on same level from slipping, tripping and stumbling with subsequent striking against unspecified object, initial encounter: Secondary | ICD-10-CM | POA: Diagnosis not present

## 2019-02-04 DIAGNOSIS — S20222A Contusion of left back wall of thorax, initial encounter: Secondary | ICD-10-CM | POA: Diagnosis not present

## 2019-02-04 DIAGNOSIS — Y999 Unspecified external cause status: Secondary | ICD-10-CM | POA: Diagnosis not present

## 2019-02-04 DIAGNOSIS — J45909 Unspecified asthma, uncomplicated: Secondary | ICD-10-CM | POA: Insufficient documentation

## 2019-02-04 DIAGNOSIS — Z79899 Other long term (current) drug therapy: Secondary | ICD-10-CM | POA: Insufficient documentation

## 2019-02-04 DIAGNOSIS — Y9229 Other specified public building as the place of occurrence of the external cause: Secondary | ICD-10-CM | POA: Insufficient documentation

## 2019-02-04 DIAGNOSIS — Z7982 Long term (current) use of aspirin: Secondary | ICD-10-CM | POA: Insufficient documentation

## 2019-02-04 DIAGNOSIS — M1712 Unilateral primary osteoarthritis, left knee: Secondary | ICD-10-CM

## 2019-02-04 DIAGNOSIS — Z794 Long term (current) use of insulin: Secondary | ICD-10-CM | POA: Diagnosis not present

## 2019-02-04 DIAGNOSIS — Y9389 Activity, other specified: Secondary | ICD-10-CM | POA: Diagnosis not present

## 2019-02-04 MED ORDER — DICLOFENAC SODIUM 75 MG PO TBEC: 75.0000 mg | DELAYED_RELEASE_TABLET | Freq: Three times a day (TID) | ORAL | 0 refills | Status: DC | PRN

## 2019-02-04 MED ORDER — METHOCARBAMOL 500 MG PO TABS: 500.0000 mg | ORAL_TABLET | Freq: Two times a day (BID) | ORAL | 0 refills | Status: DC | PRN

## 2019-02-04 MED ORDER — HYDROCODONE-ACETAMINOPHEN 5-325 MG PO TABS
1.0000 | ORAL_TABLET | Freq: Once | ORAL | Status: AC
  Administered 2019-02-04: 10:00:00 1 via ORAL
  Filled 2019-02-04: qty 1
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
## 2019-02-04 NOTE — Discharge Instructions (Signed)
Your x-rays today show that you have what appears to be arthritis of your left knee which is quite severe and may benefit from orthopedic referral by your family doctor.  The x-ray of your shoulder shows degenerative arthritis and the x-ray of your back shows arthritis and a small compression fracture of one of your spine bones, it is unclear when that occurred but you were not tender over that area today.  Please take the medications that you have been prescribed for pain including diclofenac 3 times daily as needed  Robaxin 500 mg twice a day as needed for muscle spasms  Ice and warm compresses alternating for the muscle spasms in her back.  See your doctor in 48 hours for recheck

## 2019-02-04 NOTE — ED Notes (Signed)
ED Provider at bedside. 

## 2019-02-04 NOTE — ED Triage Notes (Signed)
Patient complains of left shoulder and back pain after fall Thursday night. Pt states she was dazed after falling.

## 2019-03-03 ENCOUNTER — Other Ambulatory Visit: Payer: Self-pay | Admitting: Family Medicine

## 2019-03-03 ENCOUNTER — Other Ambulatory Visit (HOSPITAL_COMMUNITY): Payer: Self-pay | Admitting: Family Medicine

## 2019-03-03 ENCOUNTER — Ambulatory Visit (HOSPITAL_COMMUNITY): Admission: RE | Admit: 2019-03-03 | Payer: Medicare Other | Source: Ambulatory Visit

## 2019-03-03 ENCOUNTER — Encounter (HOSPITAL_COMMUNITY): Payer: Self-pay

## 2019-03-03 DIAGNOSIS — R519 Headache, unspecified: Secondary | ICD-10-CM

## 2019-03-07 ENCOUNTER — Emergency Department (HOSPITAL_COMMUNITY)
Admission: EM | Admit: 2019-03-07 | Discharge: 2019-03-07 | Discharge status: Against Medical Advice | Payer: Medicare Other

## 2019-03-09 ENCOUNTER — Ambulatory Visit (HOSPITAL_COMMUNITY)
Admission: RE | Admit: 2019-03-09 | Discharge: 2019-03-09 | Disposition: A | Discharge status: Home / Self Care | Payer: Medicare Other | Source: Ambulatory Visit | Attending: Family Medicine | Admitting: Family Medicine

## 2019-03-09 ENCOUNTER — Other Ambulatory Visit: Payer: Self-pay

## 2019-03-09 DIAGNOSIS — R519 Headache, unspecified: Secondary | ICD-10-CM | POA: Insufficient documentation

## 2019-04-24 ENCOUNTER — Other Ambulatory Visit: Payer: Medicare Other

## 2019-04-26 ENCOUNTER — Other Ambulatory Visit: Payer: Self-pay | Admitting: Family Medicine

## 2019-04-26 DIAGNOSIS — H539 Unspecified visual disturbance: Secondary | ICD-10-CM

## 2019-04-26 DIAGNOSIS — R519 Headache, unspecified: Secondary | ICD-10-CM

## 2019-04-26 DIAGNOSIS — M545 Low back pain, unspecified: Secondary | ICD-10-CM

## 2019-05-02 ENCOUNTER — Emergency Department (HOSPITAL_COMMUNITY)
Admission: EM | Admit: 2019-05-02 | Discharge: 2019-05-02 | Disposition: A | Discharge status: Home / Self Care | Payer: Medicare Other | Attending: Emergency Medicine | Admitting: Emergency Medicine

## 2019-05-02 ENCOUNTER — Other Ambulatory Visit: Payer: Self-pay

## 2019-05-02 ENCOUNTER — Encounter (HOSPITAL_COMMUNITY): Payer: Self-pay | Admitting: Emergency Medicine

## 2019-05-02 ENCOUNTER — Emergency Department (HOSPITAL_COMMUNITY): Payer: Medicare Other

## 2019-05-02 DIAGNOSIS — Z7984 Long term (current) use of oral hypoglycemic drugs: Secondary | ICD-10-CM | POA: Insufficient documentation

## 2019-05-02 DIAGNOSIS — E119 Type 2 diabetes mellitus without complications: Secondary | ICD-10-CM | POA: Diagnosis not present

## 2019-05-02 DIAGNOSIS — D649 Anemia, unspecified: Secondary | ICD-10-CM

## 2019-05-02 DIAGNOSIS — R0789 Other chest pain: Secondary | ICD-10-CM

## 2019-05-02 DIAGNOSIS — I10 Essential (primary) hypertension: Secondary | ICD-10-CM | POA: Diagnosis not present

## 2019-05-02 DIAGNOSIS — Z7982 Long term (current) use of aspirin: Secondary | ICD-10-CM | POA: Diagnosis not present

## 2019-05-02 DIAGNOSIS — J45909 Unspecified asthma, uncomplicated: Secondary | ICD-10-CM | POA: Insufficient documentation

## 2019-05-02 LAB — BASIC METABOLIC PANEL WITH GFR
Anion gap: 6 (ref 5–15)
BUN: 17 mg/dL (ref 8–23)
CO2: 24 mmol/L (ref 22–32)
Calcium: 8.5 mg/dL — ABNORMAL LOW (ref 8.9–10.3)
Chloride: 106 mmol/L (ref 98–111)
Creatinine, Ser: 0.71 mg/dL (ref 0.44–1.00)
GFR calc Af Amer: >60 mL/min
GFR calc non Af Amer: >60 mL/min
Glucose, Bld: 122 mg/dL — ABNORMAL HIGH (ref 70–99)
Potassium: 3.9 mmol/L (ref 3.5–5.1)
Sodium: 136 mmol/L (ref 135–145)

## 2019-05-02 LAB — CBC WITH DIFFERENTIAL/PLATELET
Abs Immature Granulocytes: 0.01 K/uL (ref 0.00–0.07)
Basophils Absolute: 0 K/uL (ref 0.0–0.1)
Basophils Relative: 0 %
Eosinophils Absolute: 0.1 K/uL (ref 0.0–0.5)
Eosinophils Relative: 2 %
HCT: 36.9 % (ref 36.0–46.0)
Hemoglobin: 11.7 g/dL — ABNORMAL LOW (ref 12.0–15.0)
Immature Granulocytes: 0 %
Lymphocytes Relative: 42 %
Lymphs Abs: 2.3 K/uL (ref 0.7–4.0)
MCH: 30.1 pg (ref 26.0–34.0)
MCHC: 31.7 g/dL (ref 30.0–36.0)
MCV: 94.9 fL (ref 80.0–100.0)
Monocytes Absolute: 0.4 K/uL (ref 0.1–1.0)
Monocytes Relative: 8 %
Neutro Abs: 2.6 K/uL (ref 1.7–7.7)
Neutrophils Relative %: 48 %
Platelets: 181 K/uL (ref 150–400)
RBC: 3.89 MIL/uL (ref 3.87–5.11)
RDW: 13.1 % (ref 11.5–15.5)
WBC: 5.4 K/uL (ref 4.0–10.5)
nRBC: 0 % (ref 0.0–0.2)

## 2019-05-02 LAB — D-DIMER, QUANTITATIVE: D-Dimer, Quant: 0.58 ug/mL-FEU — ABNORMAL HIGH (ref 0.00–0.50)

## 2019-05-02 LAB — TROPONIN I (HIGH SENSITIVITY): Troponin I (High Sensitivity): 3 ng/L

## 2019-05-02 MED ORDER — ORPHENADRINE CITRATE ER 100 MG PO TB12: 100.0000 mg | ORAL_TABLET | Freq: Two times a day (BID) | ORAL | 0 refills | Status: DC

## 2019-05-02 MED ORDER — KETOROLAC TROMETHAMINE 30 MG/ML IJ SOLN
15.0000 mg | Freq: Once | INTRAMUSCULAR | Status: AC
  Administered 2019-05-02: 15 mg via INTRAVENOUS
  Filled 2019-05-02: qty 1

## 2019-05-02 MED ORDER — NAPROXEN 375 MG PO TABS: 375.0000 mg | ORAL_TABLET | Freq: Two times a day (BID) | ORAL | 0 refills | Status: DC

## 2019-05-02 NOTE — Discharge Instructions (Addendum)
Apply ice as needed.  Continue taking your pain medication as needed.  Return if symptoms are getting worse.
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
## 2019-05-03 ENCOUNTER — Ambulatory Visit (HOSPITAL_COMMUNITY): Admission: RE | Admit: 2019-05-03 | Payer: Medicare Other | Source: Ambulatory Visit

## 2019-05-03 ENCOUNTER — Ambulatory Visit (HOSPITAL_COMMUNITY): Payer: Medicare Other

## 2019-05-03 ENCOUNTER — Encounter (HOSPITAL_COMMUNITY): Payer: Self-pay

## 2019-06-28 ENCOUNTER — Other Ambulatory Visit: Payer: Self-pay | Admitting: Family Medicine

## 2019-06-28 ENCOUNTER — Other Ambulatory Visit (HOSPITAL_COMMUNITY): Payer: Self-pay | Admitting: Family Medicine

## 2019-06-28 DIAGNOSIS — M545 Low back pain, unspecified: Secondary | ICD-10-CM

## 2019-06-28 DIAGNOSIS — M549 Dorsalgia, unspecified: Secondary | ICD-10-CM

## 2019-07-27 ENCOUNTER — Other Ambulatory Visit: Payer: Self-pay

## 2019-07-27 ENCOUNTER — Ambulatory Visit (HOSPITAL_COMMUNITY)
Admission: RE | Admit: 2019-07-27 | Discharge: 2019-07-27 | Disposition: A | Discharge status: Home / Self Care | Payer: Medicare Other | Source: Ambulatory Visit | Attending: Family Medicine | Admitting: Family Medicine

## 2019-07-27 DIAGNOSIS — M549 Dorsalgia, unspecified: Secondary | ICD-10-CM | POA: Insufficient documentation

## 2019-07-27 DIAGNOSIS — M545 Low back pain, unspecified: Secondary | ICD-10-CM

## 2019-08-22 NOTE — H&P (Signed)
Surgical History & Physical  Patient Name: Lisa Todd DOB: 09/11/1955  Surgery: Cataract extraction with intraocular lens implant phacoemulsification; Left Eye  Surgeon: Baruch Goldmann MD Surgery Date:  08/28/2019 Pre-Op Date:  08/21/2019  HPI: A 29 Yr. old female patient Patient is here for a cataract evaluation of both eyes. She complains of blurry vision at a distance and at near. This has been going on for several months. This is negatively affecting the patient's quality of life. She feels like her left eye is worse. She does not currently wear glasses. She also complains of her eyes feeling tired at the end of the day. She is diabetic and states that her BS has been good. She has been able to get off of insulin and only take Januvia once per day. HPI was performed by Baruch Goldmann .  Medical History: Cataract OU Diabetic Retinopathy OU Depression Diabetes High Blood Pressure  Review of Systems Cardiovascular High Blood Pressure Ear, Nose, Mouth & Throat Post Nasal Drips, Runny Nose Respiratory Cough All recorded systems are negative except as noted above.  Social   Never Smoked  Medication Artifical Tears,  Cardizem, Crestor, Januvia, Nexium, Norvasc, Oxycontin, Xanax, Aspirin 81mg ,   Sx/Procedures Hysterectomy, Knee,   Drug Allergies   NKDA  History & Physical: Heent:  Cataract, Left eye NECK: supple without bruits LUNGS: lungs clear to auscultation CV: regular rate and rhythm Abdomen: soft and non-tender  Impression & Plan: Assessment: 1.  COMBINED FORMS AGE RELATED CATARACT; Both Eyes (H25.813)  Plan: 1.  Cataract accounts for the patient's decreased vision. This visual impairment is not correctable with a tolerable change in glasses or contact lenses. Cataract surgery with an implantation of a new lens should significantly improve the visual and functional status of the patient. Discussed all risks, benefits, alternatives, and potential complications.  Discussed the procedures and recovery. Patient desires to have surgery. A-scan ordered and performed today for intra-ocular lens calculations. The surgery will be performed in order to improve vision for driving, reading, and for eye examinations. Recommend phacoemulsification with intra-ocular lens. Left Eye worse - first. Dilates well - shugarcaine by protocol.

## 2019-08-25 ENCOUNTER — Other Ambulatory Visit (HOSPITAL_COMMUNITY)
Admission: RE | Admit: 2019-08-25 | Discharge: 2019-08-25 | Disposition: A | Discharge status: Home / Self Care | Payer: Medicare Other | Source: Ambulatory Visit | Attending: Ophthalmology | Admitting: Ophthalmology

## 2019-08-25 ENCOUNTER — Encounter (HOSPITAL_COMMUNITY): Payer: Self-pay

## 2019-08-25 ENCOUNTER — Encounter (HOSPITAL_COMMUNITY)
Admission: RE | Admit: 2019-08-25 | Discharge: 2019-08-25 | Disposition: A | Discharge status: Home / Self Care | Payer: Medicare Other | Source: Ambulatory Visit | Attending: Ophthalmology | Admitting: Ophthalmology

## 2019-08-25 ENCOUNTER — Other Ambulatory Visit: Payer: Self-pay

## 2019-08-25 DIAGNOSIS — Z01812 Encounter for preprocedural laboratory examination: Secondary | ICD-10-CM | POA: Insufficient documentation

## 2019-08-25 DIAGNOSIS — Z20822 Contact with and (suspected) exposure to covid-19: Secondary | ICD-10-CM | POA: Diagnosis not present

## 2019-08-25 LAB — BASIC METABOLIC PANEL
Anion gap: 11 (ref 5–15)
BUN: 22 mg/dL (ref 8–23)
CO2: 22 mmol/L (ref 22–32)
Calcium: 9.2 mg/dL (ref 8.9–10.3)
Chloride: 103 mmol/L (ref 98–111)
Creatinine, Ser: 0.67 mg/dL (ref 0.44–1.00)
GFR calc Af Amer: >60 mL/min (ref >60)
GFR calc non Af Amer: >60 mL/min (ref >60)
Glucose, Bld: 208 mg/dL — ABNORMAL HIGH (ref 70–99)
Potassium: 4.6 mmol/L (ref 3.5–5.1)
Sodium: 136 mmol/L (ref 135–145)

## 2019-08-25 NOTE — Patient Instructions (Signed)
Lisa Todd  08/25/2019     @PREFPERIOPPHARMACY @   Your procedure is scheduled on Monday, May 10.  [facility] to [facility]  Call this number if you have problems the morning of surgery:  [phone]   Remember:  Do not eat or drink after midnight.     Take these medicines the morning of surgery with A SIP OF WATER albuterol, xanax, cardizem, nasonex, pravastatin, and flexeril, robaxin, zofran and percocet if needed. Take 1/2 of your Lantus the night before surgery. Please stop taking your phentermine until after surgery.    Do not wear jewelry, make-up or nail polish.  Do not wear lotions, powders, or perfumes, or deodorant.  Do not shave 48 hours prior to surgery.  Men may shave face and neck.  Do not bring valuables to the hospital.  [facility] is not responsible for any belongings or valuables.  Contacts, dentures or bridgework may not be worn into surgery.  Leave your suitcase in the car.  After surgery it may be brought to your room.  For patients admitted to the hospital, discharge time will be determined by your treatment team.  Patients discharged the day of surgery will not be allowed to drive home.   Name and phone number of your driver:   Rosann Auerbach, Daughter Special instructions:  none  Please read over the following fact sheets that you were given. Pain Booklet, Coughing and Deep Breathing, Surgical Site Infection Prevention, Anesthesia Post-op Instructions and Care and Recovery After Surgery      Cataract Surgery, Care After This sheet gives you information about how to care for yourself after your procedure. Your health care provider may also give you more specific instructions. If you have problems or questions, contact your health care provider. What can I expect after the procedure? After the procedure, it is common to have:  Itching.  Discomfort.  Fluid discharge.  Sensitivity to light and to touch.  Bruising in or around the  eye.  Mild blurred vision. Follow these instructions at home: Eye care   Do not touch or rub your eyes.  Protect your eyes as told by your health care provider. You may be told to wear a protective eye shield or sunglasses.  Do not put a contact lens into the affected eye or eyes until your health care provider approves.  Keep the area around your eye clean and dry: ? Avoid swimming. ? Do not allow water to hit you directly in the face while showering. ? Keep soap and shampoo out of your eyes.  Check your eye every day for signs of infection. Watch for: ? Redness, swelling, or pain. ? Fluid, blood, or pus. ? Warmth. ? A bad smell. ? Vision that is getting worse. ? Sensitivity that is getting worse. Activity  Do not drive for 24 hours if you were given a sedative during your procedure.  Avoid strenuous activities, such as playing contact sports, for as long as told by your health care provider.  Do not drive or use heavy machinery until your health care provider approves.  Do not bend or lift heavy objects. Bending increases pressure in the eye. You can walk, climb stairs, and do light household chores.  Ask your health care provider when you can return to work. If you work in a dusty environment, you may be advised to wear protective eyewear for a period of time. General instructions  Take or apply over-the-counter and prescription medicines only  as told by your health care provider. This includes eye drops.  Keep all follow-up visits as told by your health care provider. This is important. Contact a health care provider if:  You have increased bruising around your eye.  You have pain that is not helped with medicine.  You have a fever.  You have redness, swelling, or pain in your eye.  You have fluid, blood, or pus coming from your incision.  Your vision gets worse.  Your sensitivity to light gets worse. Get help right away if:  You have sudden loss of  vision.  You see flashes of light or spots (floaters).  You have severe eye pain.  You develop nausea or vomiting. Summary  After your procedure, it is common to have itching, discomfort, bruising, fluid discharge, or sensitivity to light.  Follow instructions from your health care provider about caring for your eye after the procedure.  Do not rub your eye after the procedure. You may need to wear eye protection or sunglasses. Do not wear contact lenses. Keep the area around your eye clean and dry.  Avoid activities that require a lot of effort. These include playing sports and lifting heavy objects.  Contact a health care provider if you have increased bruising, pain that does not go away, or a fever. Get help right away if you suddenly lose your vision, see flashes of light or spots, or have severe pain in the eye. This information is not intended to replace advice given to you by your health care provider. Make sure you discuss any questions you have with your health care provider. Document Revised: 01/31/2019 Document Reviewed: 10/04/2017 Elsevier Patient Education  [place]  A cataract is a buildup of protein that causes the lens of your eye to become cloudy. The lens is normally clear. It is the part of the eye that is behind your iris and pupil. The lens focuses light on the retina, which lets you see clearly. When a lens becomes cloudy, your vision may become blurry. The clouding can range from a tiny dot to complete cloudiness. As some cataracts develop, they can make it harder for you to see things that are far away. (You become more nearsighted.) Other cataracts increase glare. Cataracts can worsen over time, and sometimes the pupil can look white. As cataracts get worse, they cloud more of the lens, making it difficult to see. Cataracts can affect one eye or both eyes. What are the causes? This condition may be caused by age-related eye changes. The lens of  the eye is mostly made up of water and protein. Normally, this protein is arranged in a way that keeps the lens clear. Cataracts develop when protein begins to clump together over time. This buildup of protein clouds the lens and lets less light pass through to the retina, which causes blurry vision. What increases the risk? You are more likely to develop this condition if you:  Are 98 years of age or older.  Have diabetes.  Have high blood pressure.  Take certain medicines, such as steroids or hormone replacement therapy.  Have had an eye injury.  Have or have had eye inflammation.  Have a family history of cataracts.  Smoke.  Drink alcohol heavily.  Are frequently exposed to sun or very strong light without eye protection.  Are obese.  Have been exposed to large amounts of radiation, lead, or other toxic substances.  Have had eye surgery. What are the signs or  symptoms? The main symptom of a cataract is blurry vision. Your vision may change or get worse over time. Other symptoms include:  Increased glare.  Seeing a bright ring or halo around light.  Poor night vision.  Double or "shadow" vision in one eye or both eyes.  Having trouble seeing, even while wearing contact lenses or glasses.  Seeing colors that appear faded.  Having trouble telling the difference between blue and purple.  Needing frequent changes to your prescription glasses or contacts. How is this diagnosed? This condition is diagnosed with a medical history and eye exam.  You should see an eye specialist (optometrist or ophthalmologist).  Your health care provider may enlarge (dilate) your pupils with eye drops to see the back of your eye more clearly and look for signs of cataracts or other eye damage. You may also have tests, including:  A visual acuity test. This uses a chart to determine the smallest letters that you can see from a specific distance.  A slit-lamp exam. This uses a  microscope to examine small sections of your eye for abnormalities.  Tonometry. This test measures the pressure of the fluid inside your eye.  Glare testing. This test shines a light in your eye while you view letters to see whether the bright light affects your vision. How is this treated? Treatment depends on the stage of your cataract. You may:  Wear eyeglasses or use stronger light. This is for an early-stage cataract.  Have surgery if the condition is severely affecting your vision. This is needed for late-stage cataract.  Stop or change certain medicines. This is recommended if your health care provider thinks your cataract may be linked to your medicines. Follow these instructions at home: Lifestyle  Use stronger or brighter lighting.  Consider using a magnifying glass for reading or other activities.  Become familiar with your surroundings. Having poor vision can put you at greater risk for tripping, falling, or bumping into things.  Wear sunglasses and a hat if you are sensitive to bright light or are having problems with glare.  Do not use any products that contain nicotine or tobacco, such as cigarettes, e-cigarettes, and chewing tobacco. If you need help quitting, ask your health care provider. General instructions  If you are prescribed new eyeglasses, wear them as told by your health care provider.  Take over-the-counter and prescription medicines only as told by your health care provider. Do not change your medicines unless told by your health care provider.  Do not drive or use heavy machinery if your vision is blurry, particularly at night.  Keep your blood sugar under control if you have diabetes.  Keep all follow-up visits as told by your health care provider. This is important. Contact a health care provider if:  Your symptoms get worse.  Your vision affects your ability to perform daily activities.  You have new symptoms.  You have a fever. Get help  right away if:  You have sudden vision loss.  You have redness, swelling, or increasing pain in your eye.  You develop a headache and sensitivity to light. Summary  A cataract is a buildup of protein that causes the lens of your eye to become cloudy. Cataracts are very common, especially as people age.  Mild cataracts cause mild visual symptoms, while more severe cataracts can cause a significant decrease in quality of life.  Mild cataracts can often be treated with a prescription for new glasses or contact lenses, while surgery is  often recommended for more severe cataracts.  Contact a health care provider if your symptoms get worse, your vision affects your ability to do daily activities, or you have a fever.  Get help right away if you have sudden vision loss, redness, swelling, or increasing pain in the eye, or you develop a headache or sensitivity to light. This information is not intended to replace advice given to you by your health care provider. Make sure you discuss any questions you have with your health care provider. Document Revised: 10/04/2017 Document Reviewed: 10/04/2017 Elsevier Patient Education  2020 [place] Anesthesia is a term that refers to techniques, procedures, and medicines that help a person stay safe and comfortable during a medical procedure. Monitored anesthesia care, or sedation, is one type of anesthesia. Your anesthesia specialist may recommend sedation if you will be having a procedure that does not require you to be unconscious, such as:  Cataract surgery.  A dental procedure.  A biopsy.  A colonoscopy. During the procedure, you may receive a medicine to help you relax (sedative). There are three levels of sedation:  Mild sedation. At this level, you may feel awake and relaxed. You will be able to follow directions.  Moderate sedation. At this level, you will be sleepy. You may not remember the  procedure.  Deep sedation. At this level, you will be asleep. You will not remember the procedure. The more medicine you are given, the deeper your level of sedation will be. Depending on how you respond to the procedure, the anesthesia specialist may change your level of sedation or the type of anesthesia to fit your needs. An anesthesia specialist will monitor you closely during the procedure. Let your health care provider know about:  Any allergies you have.  All medicines you are taking, including vitamins, herbs, eye drops, creams, and over-the-counter medicines.  Any use of steroids (by mouth or as a cream).  Any problems you or family members have had with sedatives and anesthetic medicines.  Any blood disorders you have.  Any surgeries you have had.  Any medical conditions you have, such as sleep apnea.  Whether you are pregnant or may be pregnant.  Any use of cigarettes, alcohol, or street drugs. What are the risks? Generally, this is a safe procedure. However, problems may occur, including:  Getting too much medicine (oversedation).  Nausea.  Allergic reaction to medicines.  Trouble breathing. If this happens, a breathing tube may be used to help with breathing. It will be removed when you are awake and breathing on your own.  Heart trouble.  Lung trouble. Before the procedure Staying hydrated Follow instructions from your health care provider about hydration, which may include:  Up to 2 hours before the procedure - you may continue to drink clear liquids, such as water, clear fruit juice, black coffee, and plain tea. Eating and drinking restrictions Follow instructions from your health care provider about eating and drinking, which may include:  8 hours before the procedure - stop eating heavy meals or foods such as meat, fried foods, or fatty foods.  6 hours before the procedure - stop eating light meals or foods, such as toast or cereal.  6 hours before  the procedure - stop drinking milk or drinks that contain milk.  2 hours before the procedure - stop drinking clear liquids. Medicines Ask your health care provider about:  Changing or stopping your regular medicines. This is especially important if you are taking  diabetes medicines or blood thinners.  Taking medicines such as aspirin and ibuprofen. These medicines can thin your blood. Do not take these medicines before your procedure if your health care provider instructs you not to. Tests and exams  You will have a physical exam.  You may have blood tests done to show: ? How well your kidneys and liver are working. ? How well your blood can clot. General instructions  Plan to have someone take you home from the hospital or clinic.  If you will be going home right after the procedure, plan to have someone with you for 24 hours.  What happens during the procedure?  Your blood pressure, heart rate, breathing, level of pain and overall condition will be monitored.  An IV tube will be inserted into one of your veins.  Your anesthesia specialist will give you medicines as needed to keep you comfortable during the procedure. This may mean changing the level of sedation.  The procedure will be performed. After the procedure  Your blood pressure, heart rate, breathing rate, and blood oxygen level will be monitored until the medicines you were given have worn off.  Do not drive for 24 hours if you received a sedative.  You may: ? Feel sleepy, clumsy, or nauseous. ? Feel forgetful about what happened after the procedure. ? Have a sore throat if you had a breathing tube during the procedure. ? Vomit. This information is not intended to replace advice given to you by your health care provider. Make sure you discuss any questions you have with your health care provider. Document Revised: 03/19/2017 Document Reviewed: 07/28/2015 Elsevier Patient Education  [place].

## 2019-08-26 LAB — SARS CORONAVIRUS 2 (TAT 6-24 HRS): SARS Coronavirus 2: NEGATIVE

## 2019-08-28 ENCOUNTER — Ambulatory Visit (HOSPITAL_COMMUNITY)
Admission: RE | Admit: 2019-08-28 | Discharge: 2019-08-28 | Disposition: A | Discharge status: Home / Self Care | Payer: Medicare Other | Attending: Ophthalmology | Admitting: Ophthalmology

## 2019-08-28 ENCOUNTER — Encounter (HOSPITAL_COMMUNITY)
Admission: RE | Disposition: A | Discharge status: Home / Self Care | Payer: Self-pay | Source: Home / Self Care | Attending: Ophthalmology

## 2019-08-28 ENCOUNTER — Encounter (HOSPITAL_COMMUNITY): Payer: Self-pay | Admitting: Ophthalmology

## 2019-08-28 ENCOUNTER — Ambulatory Visit (HOSPITAL_COMMUNITY): Payer: Medicare Other | Admitting: Anesthesiology

## 2019-08-28 DIAGNOSIS — E11319 Type 2 diabetes mellitus with unspecified diabetic retinopathy without macular edema: Secondary | ICD-10-CM | POA: Insufficient documentation

## 2019-08-28 DIAGNOSIS — G4730 Sleep apnea, unspecified: Secondary | ICD-10-CM | POA: Insufficient documentation

## 2019-08-28 DIAGNOSIS — K219 Gastro-esophageal reflux disease without esophagitis: Secondary | ICD-10-CM | POA: Diagnosis not present

## 2019-08-28 DIAGNOSIS — F329 Major depressive disorder, single episode, unspecified: Secondary | ICD-10-CM | POA: Insufficient documentation

## 2019-08-28 DIAGNOSIS — J45909 Unspecified asthma, uncomplicated: Secondary | ICD-10-CM | POA: Insufficient documentation

## 2019-08-28 DIAGNOSIS — Z79891 Long term (current) use of opiate analgesic: Secondary | ICD-10-CM | POA: Insufficient documentation

## 2019-08-28 DIAGNOSIS — M1990 Unspecified osteoarthritis, unspecified site: Secondary | ICD-10-CM | POA: Insufficient documentation

## 2019-08-28 DIAGNOSIS — I10 Essential (primary) hypertension: Secondary | ICD-10-CM | POA: Diagnosis not present

## 2019-08-28 DIAGNOSIS — Z79899 Other long term (current) drug therapy: Secondary | ICD-10-CM | POA: Diagnosis not present

## 2019-08-28 DIAGNOSIS — H25812 Combined forms of age-related cataract, left eye: Secondary | ICD-10-CM | POA: Diagnosis present

## 2019-08-28 DIAGNOSIS — Z7984 Long term (current) use of oral hypoglycemic drugs: Secondary | ICD-10-CM | POA: Diagnosis not present

## 2019-08-28 DIAGNOSIS — E1136 Type 2 diabetes mellitus with diabetic cataract: Secondary | ICD-10-CM | POA: Insufficient documentation

## 2019-08-28 DIAGNOSIS — H25813 Combined forms of age-related cataract, bilateral: Secondary | ICD-10-CM | POA: Diagnosis not present

## 2019-08-28 DIAGNOSIS — Z7982 Long term (current) use of aspirin: Secondary | ICD-10-CM | POA: Insufficient documentation

## 2019-08-28 HISTORY — PX: CATARACT EXTRACTION W/PHACO: SHX586

## 2019-08-28 LAB — GLUCOSE, CAPILLARY: Glucose-Capillary: 121 mg/dL — ABNORMAL HIGH (ref 70–99)

## 2019-08-28 SURGERY — PHACOEMULSIFICATION, CATARACT, WITH IOL INSERTION
Anesthesia: Monitor Anesthesia Care | Site: Eye | Laterality: Left

## 2019-08-28 MED ORDER — TETRACAINE HCL 0.5 % OP SOLN
1.0000 [drp] | OPHTHALMIC | Status: AC | PRN
  Administered 2019-08-28 (×3): 1 [drp] via OPHTHALMIC

## 2019-08-28 MED ORDER — MIDAZOLAM HCL 2 MG/2ML IJ SOLN
INTRAMUSCULAR | Status: AC
  Filled 2019-08-28: qty 2

## 2019-08-28 MED ORDER — POVIDONE-IODINE 5 % OP SOLN
OPHTHALMIC | Status: DC | PRN
  Administered 2019-08-28: 1 via OPHTHALMIC

## 2019-08-28 MED ORDER — SODIUM HYALURONATE 23 MG/ML IO SOLN
INTRAOCULAR | Status: DC | PRN
  Administered 2019-08-28: 0.6 mL via INTRAOCULAR

## 2019-08-28 MED ORDER — BSS IO SOLN
INTRAOCULAR | Status: DC | PRN
  Administered 2019-08-28: 15 mL via INTRAOCULAR

## 2019-08-28 MED ORDER — EPINEPHRINE PF 1 MG/ML IJ SOLN
INTRAOCULAR | Status: DC | PRN
  Administered 2019-08-28: 500 mL

## 2019-08-28 MED ORDER — PHENYLEPHRINE HCL 2.5 % OP SOLN
1.0000 [drp] | OPHTHALMIC | Status: AC | PRN
  Administered 2019-08-28 (×3): 1 [drp] via OPHTHALMIC

## 2019-08-28 MED ORDER — LIDOCAINE HCL 3.5 % OP GEL
1.0000 "application " | Freq: Once | OPHTHALMIC | Status: AC
  Administered 2019-08-28: 1 via OPHTHALMIC

## 2019-08-28 MED ORDER — PROVISC 10 MG/ML IO SOLN
INTRAOCULAR | Status: DC | PRN
  Administered 2019-08-28: 0.85 mL via INTRAOCULAR

## 2019-08-28 MED ORDER — CYCLOPENTOLATE-PHENYLEPHRINE 0.2-1 % OP SOLN
1.0000 [drp] | OPHTHALMIC | Status: AC | PRN
  Administered 2019-08-28 (×3): 1 [drp] via OPHTHALMIC

## 2019-08-28 MED ORDER — NEOMYCIN-POLYMYXIN-DEXAMETH 3.5-10000-0.1 OP SUSP
OPHTHALMIC | Status: DC | PRN
  Administered 2019-08-28: 1 [drp] via OPHTHALMIC

## 2019-08-28 MED ORDER — LIDOCAINE HCL (PF) 1 % IJ SOLN
INTRAOCULAR | Status: DC | PRN
  Administered 2019-08-28: 1 mL via OPHTHALMIC

## 2019-08-28 SURGICAL SUPPLY — 19 items
CLOTH BEACON ORANGE TIMEOUT ST (SAFETY) ×2 IMPLANT
DEVICE MILOOP (MISCELLANEOUS) IMPLANT
EYE SHIELD UNIVERSAL CLEAR (GAUZE/BANDAGES/DRESSINGS) ×2 IMPLANT
GLOVE BIOGEL PI IND STRL 6.5 (GLOVE) IMPLANT
GLOVE BIOGEL PI IND STRL 7.0 (GLOVE) IMPLANT
GLOVE BIOGEL PI INDICATOR 6.5 (GLOVE)
GLOVE BIOGEL PI INDICATOR 7.0 (GLOVE) ×4
LENS ALC ACRYL/TECN (Ophthalmic Related) ×2 IMPLANT
MILOOP DEVICE (MISCELLANEOUS)
NDL HYPO 18GX1.5 BLUNT FILL (NEEDLE) IMPLANT
NEEDLE HYPO 18GX1.5 BLUNT FILL (NEEDLE) ×3 IMPLANT
PAD ARMBOARD 7.5X6 YLW CONV (MISCELLANEOUS) ×2 IMPLANT
RING MALYGIN (MISCELLANEOUS) IMPLANT
RING MALYGIN 7.0 (MISCELLANEOUS) IMPLANT
SYR TB 1ML LL NO SAFETY (SYRINGE) ×2 IMPLANT
TAPE SURG TRANSPORE 1 IN (GAUZE/BANDAGES/DRESSINGS) IMPLANT
TAPE SURGICAL TRANSPORE 1 IN (GAUZE/BANDAGES/DRESSINGS) ×3
VISCOELASTIC ADDITIONAL (OPHTHALMIC RELATED) ×2 IMPLANT
WATER STERILE IRR 250ML POUR (IV SOLUTION) ×2 IMPLANT

## 2019-08-28 NOTE — Transfer of Care (Signed)
Immediate Anesthesia Transfer of Care Note  Patient: Lisa Todd  Procedure(s) Performed: CATARACT EXTRACTION PHACO AND INTRAOCULAR LENS PLACEMENT (IOC) CDE: 18.46 (Left Eye)  Patient Location: Short Stay  Anesthesia Type:MAC  Level of Consciousness: awake  Airway & Oxygen Therapy: Patient Spontanous Breathing  Post-op Assessment: Report given to RN  Post vital signs: Reviewed  Last Vitals:  Vitals Value Taken Time  BP    Temp    Pulse    Resp    SpO2      Last Pain:  Vitals:   08/28/19 0826  TempSrc: Oral  PainSc: 0-No pain      Patients Stated Pain Goal: 5 (45/[id number])  Complications: No apparent anesthesia complications

## 2019-08-28 NOTE — Op Note (Signed)
Date of procedure: 08/28/19  Pre-operative diagnosis: Visually significant age-related combined cataract, Left Eye (H25.812)  Post-operative diagnosis: Visually significant age-related combined cataract, Left Eye (H25.812)  Procedure: Removal of cataract via phacoemulsification and insertion of intra-ocular lens [place]  +21.0D into the capsular bag of the Left Eye  Attending surgeon: Gerda Diss. Janylah Belgrave, MD, MA  Anesthesia: MAC, Topical [place]  Complications: None  Estimated Blood Loss: <24m (minimal)  Specimens: None  Implants: As above  Indications:  Visually significant age-related cataract, Left Eye  Procedure:  The patient was seen and identified in the pre-operative area. The operative eye was identified and dilated.  The operative eye was marked.  Topical anesthesia was administered to the operative eye.     The patient was then to the operative suite and placed in the supine position.  A timeout was performed confirming the patient, procedure to be performed, and all other relevant information.   The patient's face was prepped and draped in the usual fashion for intra-ocular surgery.  A lid speculum was placed into the operative eye and the surgical microscope moved into place and focused.  An inferotemporal paracentesis was created using a 20 gauge paracentesis blade.  Shugarcaine was injected into the anterior chamber.  Viscoelastic was injected into the anterior chamber.  A temporal clear-corneal main wound incision was created using a 2.471mmicrokeratome.  A continuous curvilinear capsulorrhexis was initiated using an irrigating cystitome and completed using capsulorrhexis forceps.  Hydrodissection and hydrodeliniation were performed.  Viscoelastic was injected into the anterior chamber.  A phacoemulsification handpiece and a chopper as a second instrument were used to remove the nucleus and epinucleus. The irrigation/aspiration handpiece was used to remove  any remaining cortical material.   The capsular bag was reinflated with viscoelastic, checked, and found to be intact.  The intraocular lens was inserted into the capsular bag.  The irrigation/aspiration handpiece was used to remove any remaining viscoelastic.  The clear corneal wound and paracentesis wounds were then hydrated and checked with Weck-Cels to be watertight.  The lid-speculum was removed.  The drape was removed.  The patient's face was cleaned with a wet and dry 4x4.   Maxitrol was instilled in the eye before a clear shield was taped over the eye. The patient was taken to the post-operative care unit in good condition, having tolerated the procedure well.  Post-Op Instructions: The [facility] and will receive all other orders and instructions.

## 2019-08-28 NOTE — Anesthesia Preprocedure Evaluation (Signed)
Anesthesia Evaluation  Patient identified by MRN, date of birth, ID band Patient awake    Reviewed: Allergy & Precautions, NPO status , Patient's Chart, lab work & pertinent test results  Airway Mallampati: II  TM Distance: >3 FB Neck ROM: Full    Dental  (+) Missing, Dental Advisory Given   Pulmonary asthma , sleep apnea and Oxygen sleep apnea ,    Pulmonary exam normal breath sounds clear to auscultation       Cardiovascular METS: 3 - Mets hypertension, Pt. on medications Normal cardiovascular exam Rhythm:Regular Rate:Normal  EKG - NSR, PACs   Neuro/Psych PSYCHIATRIC DISORDERS Anxiety Depression    GI/Hepatic Neg liver ROS, GERD  Medicated and Controlled,Gastric bypass    Endo/Other  diabetes, Well Controlled, Type 2, Oral Hypoglycemic Agents  Renal/GU negative Renal ROS  negative genitourinary   Musculoskeletal  (+) Arthritis , Back pain   Abdominal   Peds  Hematology  (+) anemia ,   Anesthesia Other Findings   Reproductive/Obstetrics                             Anesthesia Physical Anesthesia Plan  ASA: III  Anesthesia Plan: MAC   Post-op Pain Management:    Induction:   PONV Risk Score and Plan:   Airway Management Planned: Nasal Cannula and Natural Airway  Additional Equipment:   Intra-op Plan:   Post-operative Plan:   Informed Consent: I have reviewed the patients History and Physical, chart, labs and discussed the procedure including the risks, benefits and alternatives for the proposed anesthesia with the patient or authorized representative who has indicated his/her understanding and acceptance.     Dental advisory given  Plan Discussed with: CRNA  Anesthesia Plan Comments:         Anesthesia Quick Evaluation

## 2019-08-28 NOTE — Interval H&P Note (Signed)
History and Physical Interval Note: The H and P was reviewed and updated. The patient was examined.  No changes were found after exam.  The surgical eye was marked.  08/28/2019 9:18 AM  Lisa Todd  has presented today for surgery, with the diagnosis of nuclear cataract left eye.  The various methods of treatment have been discussed with the patient and family. After consideration of risks, benefits and other options for treatment, the patient has consented to  Procedure(s) with comments: CATARACT EXTRACTION PHACO AND INTRAOCULAR LENS PLACEMENT ([place]) (Left) - left as a surgical intervention.  The patient's history has been reviewed, patient examined, no change in status, stable for surgery.  I have reviewed the patient's chart and labs.  Questions were answered to the patient's satisfaction.     Baruch Goldmann

## 2019-08-28 NOTE — Discharge Instructions (Signed)
Please discharge patient when stable, will follow up today with Dr. Mayer Vondrak at the [facility] [place] office immediately following discharge.  Leave shield in place until visit.  All paperwork with discharge instructions will be given at the office.  [facility] Address:  [address]  [place], [place] [address]  

## 2019-08-28 NOTE — Anesthesia Postprocedure Evaluation (Signed)
Anesthesia Post Note  Patient: Lisa Todd  Procedure(s) Performed: CATARACT EXTRACTION PHACO AND INTRAOCULAR LENS PLACEMENT (IOC) CDE: 18.46 (Left Eye)  Patient location during evaluation: Short Stay Anesthesia Type: MAC Level of consciousness: awake and alert and oriented Pain management: pain level controlled Vital Signs Assessment: post-procedure vital signs reviewed and stable Respiratory status: spontaneous breathing Cardiovascular status: blood pressure returned to baseline and stable Postop Assessment: no apparent nausea or vomiting Anesthetic complications: no     Last Vitals:  Vitals:   08/28/19 0826  BP: (!) 170/80  Pulse: 64  Resp: 17  Temp: 36.7 C  SpO2: 100%    Last Pain:  Vitals:   08/28/19 0826  TempSrc: Oral  PainSc: 0-No pain                 Iman Orourke

## 2019-09-06 ENCOUNTER — Encounter (HOSPITAL_COMMUNITY): Admission: RE | Admit: 2019-09-06 | Payer: Medicare Other | Source: Ambulatory Visit

## 2019-09-08 ENCOUNTER — Other Ambulatory Visit (HOSPITAL_COMMUNITY): Payer: Medicare Other

## 2019-09-19 ENCOUNTER — Institutional Professional Consult (permissible substitution): Payer: Medicare Other | Admitting: Plastic Surgery

## 2019-09-25 NOTE — H&P (Signed)
Surgical History & Physical  Patient Name: Lisa Todd DOB: Nov 08, 1955  Surgery: Cataract extraction with intraocular lens implant phacoemulsification; Right Eye  Surgeon: Baruch Goldmann MD Surgery Date:  10/02/2019 Pre-Op Date:  09/04/2019  HPI: A 81 Yr. old female patient 1. 1. The patient is returning after cataract surgery, x1wk. The left eye is affected. Since the last visit, the affected area is doing well. PT reports having a sharp pain last night but no decrease in [place]. Patient is following medication instructions. PT states she is ready for her right eye. She states she can tell a difference between the two eyes now. She is noticing her vision is compromised at both distance and near out of her right eye. This is negatively affecting the patient's quality of life. No flashes or floaters. HPI was performed by Baruch Goldmann .  Medical History: Cataract OU Diabetic Retinopathy OU Depression Diabetes High Blood Pressure  Review of Systems Cardiovascular High Blood Pressure Ear, Nose, Mouth & Throat Post Nasal Drips, Runny Nose Respiratory Cough All recorded systems are negative except as noted above.  Social   Never Smoked  Medication Artifical Tears, Ilevro, Moxifloxacin, Prednisolone acetate 1%,  Cardizem, Crestor, Januvia, Nexium, Norvasc, Oxycontin, Xanax, Aspirin 81mg ,   Sx/Procedures Phaco c IOL OS,  Hysterectomy, Knee,   Drug Allergies   NKDA  History & Physical: Heent:  Cataract, Right eye NECK: supple without bruits LUNGS: lungs clear to auscultation CV: regular rate and rhythm Abdomen: soft and non-tender  Impression & Plan: Assessment: 1.  COMBINED FORMS AGE RELATED CATARACT; Both Eyes (H25.813) 2.  CATARACT EXTRACTION STATUS; Left Eye (Z98.42)  Plan: 1.  Dilates well - shugarcaine by protocol. Cataract accounts for the patient's decreased vision. This visual impairment is not correctable with a tolerable change in glasses or contact lenses.  Cataract surgery with an implantation of a new lens should significantly improve the visual and functional status of the patient. Discussed all risks, benefits, alternatives, and potential complications. Discussed the procedures and recovery. Patient desires to have surgery. A-scan ordered and performed today for intra-ocular lens calculations. The surgery will be performed in order to improve vision for driving, reading, and for eye examinations. Recommend phacoemulsification with intra-ocular lens. Right Eye. Surgery required to correct imbalance of vision. 2.  1 week after cataract surgery. Doing well with improved vision and normal eye pressure. Call with any problems or concerns. Stop Vigamox. Continue Ilevro 1 drop 1x/day for 3 more weeks. Continue Pred Acetate 1 drop 2x/day for 3 more weeks.

## 2019-09-28 ENCOUNTER — Encounter (HOSPITAL_COMMUNITY)
Admission: RE | Admit: 2019-09-28 | Discharge: 2019-09-28 | Disposition: A | Discharge status: Home / Self Care | Payer: Medicare Other | Source: Ambulatory Visit | Attending: Ophthalmology | Admitting: Ophthalmology

## 2019-09-28 ENCOUNTER — Other Ambulatory Visit: Payer: Self-pay

## 2019-09-29 ENCOUNTER — Other Ambulatory Visit: Payer: Self-pay

## 2019-09-29 ENCOUNTER — Other Ambulatory Visit (HOSPITAL_COMMUNITY)
Admission: RE | Admit: 2019-09-29 | Discharge: 2019-09-29 | Disposition: A | Discharge status: Home / Self Care | Payer: Medicare Other | Source: Ambulatory Visit | Attending: Ophthalmology | Admitting: Ophthalmology

## 2019-09-29 DIAGNOSIS — Z20822 Contact with and (suspected) exposure to covid-19: Secondary | ICD-10-CM | POA: Insufficient documentation

## 2019-09-29 DIAGNOSIS — Z01812 Encounter for preprocedural laboratory examination: Secondary | ICD-10-CM | POA: Diagnosis present

## 2019-09-30 LAB — SARS CORONAVIRUS 2 (TAT 6-24 HRS): SARS Coronavirus 2: NEGATIVE

## 2019-10-02 ENCOUNTER — Ambulatory Visit (HOSPITAL_COMMUNITY)
Admission: RE | Admit: 2019-10-02 | Discharge: 2019-10-02 | Disposition: A | Discharge status: Home / Self Care | Payer: Medicare Other | Attending: Ophthalmology | Admitting: Ophthalmology

## 2019-10-02 ENCOUNTER — Encounter (HOSPITAL_COMMUNITY)
Admission: RE | Disposition: A | Discharge status: Home / Self Care | Payer: Self-pay | Source: Home / Self Care | Attending: Ophthalmology

## 2019-10-02 ENCOUNTER — Ambulatory Visit (HOSPITAL_COMMUNITY): Payer: Medicare Other | Admitting: Anesthesiology

## 2019-10-02 ENCOUNTER — Encounter (HOSPITAL_COMMUNITY): Payer: Self-pay | Admitting: Ophthalmology

## 2019-10-02 DIAGNOSIS — E11319 Type 2 diabetes mellitus with unspecified diabetic retinopathy without macular edema: Secondary | ICD-10-CM | POA: Diagnosis not present

## 2019-10-02 DIAGNOSIS — H25811 Combined forms of age-related cataract, right eye: Secondary | ICD-10-CM | POA: Insufficient documentation

## 2019-10-02 DIAGNOSIS — Z79899 Other long term (current) drug therapy: Secondary | ICD-10-CM | POA: Insufficient documentation

## 2019-10-02 DIAGNOSIS — I10 Essential (primary) hypertension: Secondary | ICD-10-CM | POA: Insufficient documentation

## 2019-10-02 DIAGNOSIS — F329 Major depressive disorder, single episode, unspecified: Secondary | ICD-10-CM | POA: Insufficient documentation

## 2019-10-02 DIAGNOSIS — E1136 Type 2 diabetes mellitus with diabetic cataract: Secondary | ICD-10-CM | POA: Diagnosis not present

## 2019-10-02 DIAGNOSIS — Z7982 Long term (current) use of aspirin: Secondary | ICD-10-CM | POA: Diagnosis not present

## 2019-10-02 DIAGNOSIS — Z9842 Cataract extraction status, left eye: Secondary | ICD-10-CM | POA: Insufficient documentation

## 2019-10-02 DIAGNOSIS — G4730 Sleep apnea, unspecified: Secondary | ICD-10-CM | POA: Diagnosis not present

## 2019-10-02 DIAGNOSIS — K219 Gastro-esophageal reflux disease without esophagitis: Secondary | ICD-10-CM | POA: Insufficient documentation

## 2019-10-02 HISTORY — PX: CATARACT EXTRACTION W/PHACO: SHX586

## 2019-10-02 LAB — GLUCOSE, CAPILLARY: Glucose-Capillary: 88 mg/dL (ref 70–99)

## 2019-10-02 SURGERY — PHACOEMULSIFICATION, CATARACT, WITH IOL INSERTION
Anesthesia: Monitor Anesthesia Care | Site: Eye | Laterality: Right

## 2019-10-02 MED ORDER — MIDAZOLAM HCL 2 MG/2ML IJ SOLN
INTRAMUSCULAR | Status: DC | PRN
  Administered 2019-10-02: 2 mg via INTRAVENOUS

## 2019-10-02 MED ORDER — LIDOCAINE HCL 3.5 % OP GEL
1.0000 "application " | Freq: Once | OPHTHALMIC | Status: AC
  Administered 2019-10-02: 1 via OPHTHALMIC

## 2019-10-02 MED ORDER — LIDOCAINE HCL (PF) 1 % IJ SOLN
INTRAOCULAR | Status: DC | PRN
  Administered 2019-10-02: 1 mL via OPHTHALMIC

## 2019-10-02 MED ORDER — POVIDONE-IODINE 5 % OP SOLN
OPHTHALMIC | Status: DC | PRN
  Administered 2019-10-02: 1 via OPHTHALMIC

## 2019-10-02 MED ORDER — PROVISC 10 MG/ML IO SOLN
INTRAOCULAR | Status: DC | PRN
  Administered 2019-10-02: 0.85 mL via INTRAOCULAR

## 2019-10-02 MED ORDER — SODIUM CHLORIDE 0.9% FLUSH
10.0000 mL | INTRAVENOUS | Status: DC | PRN
  Administered 2019-10-02: 3 mL via INTRAVENOUS

## 2019-10-02 MED ORDER — BSS IO SOLN
INTRAOCULAR | Status: DC | PRN
  Administered 2019-10-02: 15 mL via INTRAOCULAR

## 2019-10-02 MED ORDER — MIDAZOLAM HCL 2 MG/2ML IJ SOLN
INTRAMUSCULAR | Status: AC
  Filled 2019-10-02: qty 2

## 2019-10-02 MED ORDER — CYCLOPENTOLATE-PHENYLEPHRINE 0.2-1 % OP SOLN
1.0000 [drp] | OPHTHALMIC | Status: AC | PRN
  Administered 2019-10-02 (×3): 1 [drp] via OPHTHALMIC

## 2019-10-02 MED ORDER — SODIUM HYALURONATE 23 MG/ML IO SOLN
INTRAOCULAR | Status: DC | PRN
  Administered 2019-10-02: 0.6 mL via INTRAOCULAR

## 2019-10-02 MED ORDER — EPINEPHRINE PF 1 MG/ML IJ SOLN
INTRAMUSCULAR | Status: AC
  Filled 2019-10-02: qty 2

## 2019-10-02 MED ORDER — EPINEPHRINE PF 1 MG/ML IJ SOLN
INTRAOCULAR | Status: DC | PRN
  Administered 2019-10-02: 500 mL

## 2019-10-02 MED ORDER — PHENYLEPHRINE HCL 2.5 % OP SOLN
1.0000 [drp] | OPHTHALMIC | Status: AC | PRN
  Administered 2019-10-02 (×3): 1 [drp] via OPHTHALMIC

## 2019-10-02 MED ORDER — TETRACAINE HCL 0.5 % OP SOLN
1.0000 [drp] | OPHTHALMIC | Status: AC | PRN
  Administered 2019-10-02 (×3): 1 [drp] via OPHTHALMIC

## 2019-10-02 MED ORDER — NEOMYCIN-POLYMYXIN-DEXAMETH 3.5-10000-0.1 OP SUSP
OPHTHALMIC | Status: DC | PRN
  Administered 2019-10-02: 1 [drp] via OPHTHALMIC

## 2019-10-02 SURGICAL SUPPLY — 15 items

## 2019-10-02 NOTE — Transfer of Care (Signed)
Immediate Anesthesia Transfer of Care Note  Patient: Lisa Todd  Procedure(s) Performed: CATARACT EXTRACTION PHACO AND INTRAOCULAR LENS PLACEMENT (IOC) (Right Eye)  Patient Location: Short Stay  Anesthesia Type:MAC  Level of Consciousness: awake and patient cooperative  Airway & Oxygen Therapy: Patient Spontanous Breathing  Post-op Assessment: Report given to RN and Post -op Vital signs reviewed and stable  Post vital signs: Reviewed and stable  Last Vitals:  Vitals Value Taken Time  BP    Temp 98.1   Pulse    Resp    SpO2      Last Pain:  Vitals:   10/02/19 1015  PainSc: 0-No pain         Complications: No complications documented.

## 2019-10-02 NOTE — Interval H&P Note (Signed)
History and Physical Interval Note: The H and P was reviewed and updated. The patient was examined.  No changes were found after exam.  The surgical eye was marked.  10/02/2019 10:36 AM  Lisa Todd  has presented today for surgery, with the diagnosis of Nuclear sclerotic cataract - Right eye.  The various methods of treatment have been discussed with the patient and family. After consideration of risks, benefits and other options for treatment, the patient has consented to  Procedure(s) with comments: CATARACT EXTRACTION PHACO AND INTRAOCULAR LENS PLACEMENT (IOC) (Right) - right as a surgical intervention.  The patient's history has been reviewed, patient examined, no change in status, stable for surgery.  I have reviewed the patient's chart and labs.  Questions were answered to the patient's satisfaction.     Baruch Goldmann

## 2019-10-02 NOTE — Discharge Instructions (Signed)
Please discharge patient when stable, will follow up today with Dr. Keyari Kleeman at the [facility] [place] office immediately following discharge.  Leave shield in place until visit.  All paperwork with discharge instructions will be given at the office.  [facility] [place] [place] Address:  [address]  [place], [place] [address]             Monitored Anesthesia Care, Care After These instructions provide you with information about caring for yourself after your procedure. Your health care provider may also give you more specific instructions. Your treatment has been planned according to current medical practices, but problems sometimes occur. Call your health care provider if you have any problems or questions after your procedure. What can I expect after the procedure? After your procedure, you may:  Feel sleepy for several hours.  Feel clumsy and have poor balance for several hours.  Feel forgetful about what happened after the procedure.  Have poor judgment for several hours.  Feel nauseous or vomit.  Have a sore throat if you had a breathing tube during the procedure. Follow these instructions at home: For at least 24 hours after the procedure:      Have a responsible adult stay with you. It is important to have someone help care for you until you are awake and alert.  Rest as needed.  Do not: ? Participate in activities in which you could fall or become injured. ? Drive. ? Use heavy machinery. ? Drink alcohol. ? Take sleeping pills or medicines that cause drowsiness. ? Make important decisions or sign legal documents. ? Take care of children on your own. Eating and drinking  Follow the diet that is recommended by your health care provider.  If you vomit, drink water, juice, or soup when you can drink without vomiting.  Make sure you have little or no nausea before eating solid foods. General instructions  Take over-the-counter and  prescription medicines only as told by your health care provider.  If you have sleep apnea, surgery and certain medicines can increase your risk for breathing problems. Follow instructions from your health care provider about wearing your sleep device: ? Anytime you are sleeping, including during daytime naps. ? While taking prescription pain medicines, sleeping medicines, or medicines that make you drowsy.  If you smoke, do not smoke without supervision.  Keep all follow-up visits as told by your health care provider. This is important. Contact a health care provider if:  You keep feeling nauseous or you keep vomiting.  You feel light-headed.  You develop a rash.  You have a fever. Get help right away if:  You have trouble breathing. Summary  For several hours after your procedure, you may feel sleepy and have poor judgment.  Have a responsible adult stay with you for at least 24 hours or until you are awake and alert. This information is not intended to replace advice given to you by your health care provider. Make sure you discuss any questions you have with your health care provider. Document Revised: 07/05/2017 Document Reviewed: 07/28/2015 Elsevier Patient Education  2020 [employer]  

## 2019-10-02 NOTE — Anesthesia Postprocedure Evaluation (Signed)
Anesthesia Post Note  Patient: DAWNN NAM  Procedure(s) Performed: CATARACT EXTRACTION PHACO AND INTRAOCULAR LENS PLACEMENT (IOC) (Right Eye)  Patient location during evaluation: Short Stay Anesthesia Type: MAC Level of consciousness: awake and alert and patient cooperative Pain management: satisfactory to patient Vital Signs Assessment: post-procedure vital signs reviewed and stable Respiratory status: spontaneous breathing Cardiovascular status: stable Postop Assessment: no apparent nausea or vomiting Anesthetic complications: no   No complications documented.   Last Vitals:  Vitals:   10/02/19 1030 10/02/19 1100  BP:  (!) 175/72  Pulse: 63 64  Resp: 13 16  Temp:  36.7 C  SpO2: 100% 100%    Last Pain:  Vitals:   10/02/19 1100  TempSrc: Oral  PainSc: 0-No pain                 Marrio Scribner

## 2019-10-02 NOTE — Op Note (Signed)
Date of procedure: 10/02/19  Pre-operative diagnosis:  Visually significant combined form age-related cataract, Right Eye (H25.811)  Post-operative diagnosis:  Visually significant combined form age-related cataract, Right Eye (H25.811)  Procedure: Removal of cataract via phacoemulsification and insertion of intra-ocular lens Wynetta Emery and [employer] DCB00  +21.5D into the capsular bag of the Right Eye  Attending surgeon: Gerda Diss. Izabelle Daus, MD, MA  Anesthesia: MAC, Topical Akten  Complications: None  Estimated Blood Loss: <37m (minimal)  Specimens: None  Implants: As above  Indications:  Visually significant age-related cataract, Right Eye  Procedure:  The patient was seen and identified in the pre-operative area. The operative eye was identified and dilated.  The operative eye was marked.  Topical anesthesia was administered to the operative eye.     The patient was then to the operative suite and placed in the supine position.  A timeout was performed confirming the patient, procedure to be performed, and all other relevant information.   The patient's face was prepped and draped in the usual fashion for intra-ocular surgery.  A lid speculum was placed into the operative eye and the surgical microscope moved into place and focused.  A superotemporal paracentesis was created using a 20 gauge paracentesis blade.  Shugarcaine was injected into the anterior chamber.  Viscoelastic was injected into the anterior chamber.  A temporal clear-corneal main wound incision was created using a 2.432mmicrokeratome.  A continuous curvilinear capsulorrhexis was initiated using an irrigating cystitome and completed using capsulorrhexis forceps.  Hydrodissection and hydrodeliniation were performed.  Viscoelastic was injected into the anterior chamber.  A phacoemulsification handpiece and a chopper as a second instrument were used to remove the nucleus and epinucleus. The irrigation/aspiration handpiece was  used to remove any remaining cortical material.   The capsular bag was reinflated with viscoelastic, checked, and found to be intact.  The intraocular lens was inserted into the capsular bag.  The irrigation/aspiration handpiece was used to remove any remaining viscoelastic.  The clear corneal wound and paracentesis wounds were then hydrated and checked with Weck-Cels to be watertight.  The lid-speculum was removed.  The drape was removed.  The patient's face was cleaned with a wet and dry 4x4.   Maxitrol was instilled in the eye. A clear shield was taped over the eye. The patient was taken to the post-operative care unit in good condition, having tolerated the procedure well.  Post-Op Instructions: The patient will follow up at [facility] a same day post-operative evaluation and will receive all other orders and instructions.

## 2019-10-02 NOTE — Anesthesia Preprocedure Evaluation (Signed)
Anesthesia Evaluation  Patient identified by MRN, date of birth, ID band Patient awake    Reviewed: Allergy & Precautions, H&P , NPO status , Patient's Chart, lab work & pertinent test results, reviewed documented beta blocker date and time   Airway Mallampati: II  TM Distance: >3 FB Neck ROM: full    Dental no notable dental hx.    Pulmonary asthma , sleep apnea and Oxygen sleep apnea ,    Pulmonary exam normal breath sounds clear to auscultation       Cardiovascular Exercise Tolerance: Good hypertension, negative cardio ROS   Rhythm:regular Rate:Normal     Neuro/Psych PSYCHIATRIC DISORDERS Anxiety Depression negative neurological ROS     GI/Hepatic Neg liver ROS, GERD  Medicated,  Endo/Other  negative endocrine ROSdiabetes  Renal/GU negative Renal ROS  negative genitourinary   Musculoskeletal   Abdominal   Peds  Hematology negative hematology ROS (+)   Anesthesia Other Findings   Reproductive/Obstetrics negative OB ROS                             Anesthesia Physical Anesthesia Plan  ASA: III  Anesthesia Plan: MAC   Post-op Pain Management:    Induction:   PONV Risk Score and Plan: 2  Airway Management Planned:   Additional Equipment:   Intra-op Plan:   Post-operative Plan:   Informed Consent: I have reviewed the patients History and Physical, chart, labs and discussed the procedure including the risks, benefits and alternatives for the proposed anesthesia with the patient or authorized representative who has indicated his/her understanding and acceptance.     Dental Advisory Given  Plan Discussed with: CRNA  Anesthesia Plan Comments:         Anesthesia Quick Evaluation

## 2019-10-03 ENCOUNTER — Encounter (HOSPITAL_COMMUNITY): Payer: Self-pay | Admitting: Ophthalmology

## 2019-10-12 ENCOUNTER — Other Ambulatory Visit (HOSPITAL_COMMUNITY): Payer: Medicare Other

## 2019-10-16 ENCOUNTER — Other Ambulatory Visit (HOSPITAL_COMMUNITY): Payer: Medicare Other

## 2019-11-09 ENCOUNTER — Institutional Professional Consult (permissible substitution): Payer: Medicare Other | Admitting: Plastic Surgery

## 2020-02-09 ENCOUNTER — Other Ambulatory Visit (HOSPITAL_COMMUNITY): Payer: Self-pay | Admitting: Family Medicine

## 2020-02-09 DIAGNOSIS — Z1231 Encounter for screening mammogram for malignant neoplasm of breast: Secondary | ICD-10-CM

## 2020-02-22 ENCOUNTER — Ambulatory Visit (HOSPITAL_COMMUNITY): Payer: Medicare Other

## 2020-04-01 ENCOUNTER — Encounter (HOSPITAL_COMMUNITY): Payer: Self-pay | Admitting: Emergency Medicine

## 2020-04-01 ENCOUNTER — Emergency Department (HOSPITAL_COMMUNITY)
Admission: EM | Admit: 2020-04-01 | Discharge: 2020-04-01 | Disposition: A | Discharge status: Home / Self Care | Payer: Medicare Other | Attending: Emergency Medicine | Admitting: Emergency Medicine

## 2020-04-01 ENCOUNTER — Other Ambulatory Visit: Payer: Self-pay

## 2020-04-01 ENCOUNTER — Emergency Department (HOSPITAL_COMMUNITY): Payer: Medicare Other

## 2020-04-01 DIAGNOSIS — R590 Localized enlarged lymph nodes: Secondary | ICD-10-CM | POA: Insufficient documentation

## 2020-04-01 DIAGNOSIS — Z794 Long term (current) use of insulin: Secondary | ICD-10-CM | POA: Diagnosis not present

## 2020-04-01 DIAGNOSIS — Z7982 Long term (current) use of aspirin: Secondary | ICD-10-CM | POA: Diagnosis not present

## 2020-04-01 DIAGNOSIS — I10 Essential (primary) hypertension: Secondary | ICD-10-CM | POA: Insufficient documentation

## 2020-04-01 DIAGNOSIS — E785 Hyperlipidemia, unspecified: Secondary | ICD-10-CM | POA: Insufficient documentation

## 2020-04-01 DIAGNOSIS — E1169 Type 2 diabetes mellitus with other specified complication: Secondary | ICD-10-CM | POA: Insufficient documentation

## 2020-04-01 DIAGNOSIS — Z20822 Contact with and (suspected) exposure to covid-19: Secondary | ICD-10-CM | POA: Diagnosis not present

## 2020-04-01 DIAGNOSIS — J45909 Unspecified asthma, uncomplicated: Secondary | ICD-10-CM | POA: Insufficient documentation

## 2020-04-01 DIAGNOSIS — J069 Acute upper respiratory infection, unspecified: Secondary | ICD-10-CM | POA: Diagnosis not present

## 2020-04-01 DIAGNOSIS — Z79899 Other long term (current) drug therapy: Secondary | ICD-10-CM | POA: Insufficient documentation

## 2020-04-01 DIAGNOSIS — J029 Acute pharyngitis, unspecified: Secondary | ICD-10-CM | POA: Diagnosis present

## 2020-04-01 LAB — RESP PANEL BY RT-PCR (FLU A&B, COVID) ARPGX2
Influenza A by PCR: NEGATIVE
Influenza B by PCR: NEGATIVE
SARS Coronavirus 2 by RT PCR: NEGATIVE

## 2020-04-01 LAB — GROUP A STREP BY PCR: Group A Strep by PCR: NOT DETECTED

## 2020-04-01 NOTE — ED Triage Notes (Signed)
Pt states she was sent home from work on Friday for exposure to covid. Pt states she started having symptoms of fever, congestion, cough on Saturday. Pt states she wants to get a covid test.
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
## 2020-04-01 NOTE — ED Notes (Signed)
No pustules noted to back of throat.

## 2020-04-01 NOTE — Discharge Instructions (Addendum)
You were seen today for an upper respiratory infection.  Your Covid, flu and strep test were negative.  Your chest x-ray was reassuring.  Use Flonase as directed for your congestion and Mucinex/Robitussin for your cough. Continue to stay well-hydrated. Gargle warm salt water and spit it out for sore throat. Take benadryl or other antihistamine to decrease secretions and for watery itchy eyes. Continued to alternate between Tylenol and ibuprofen for pain. Followup with your primary care doctor in 5-7 days or referral for urgent care for recheck of ongoing symptoms however return to emergency department for emergent changing or worsening of symptoms- example: shortness of breath, chest pain, difficulty swallowing, muffled voice.

## 2020-04-01 NOTE — ED Notes (Signed)
Walked pt down fast-track hallway, SpO2% went from 99% to 98% on room air.

## 2020-08-09 ENCOUNTER — Encounter: Payer: Self-pay | Admitting: Plastic Surgery

## 2020-08-09 ENCOUNTER — Ambulatory Visit (INDEPENDENT_AMBULATORY_CARE_PROVIDER_SITE_OTHER): Payer: Medicare Other | Admitting: Plastic Surgery

## 2020-08-09 DIAGNOSIS — M542 Cervicalgia: Secondary | ICD-10-CM

## 2020-08-09 DIAGNOSIS — N62 Hypertrophy of breast: Secondary | ICD-10-CM

## 2020-08-09 DIAGNOSIS — E65 Localized adiposity: Secondary | ICD-10-CM

## 2020-08-09 DIAGNOSIS — Z6841 Body Mass Index (BMI) 40.0 and over, adult: Secondary | ICD-10-CM

## 2020-08-09 NOTE — Progress Notes (Deleted)
Patient ID: Lisa Todd, female    DOB: 1955/07/03, 65 y.o.   MRN: [id number]   Chief Complaint  Patient presents with  . Breast Problem    Mammary Hyperplasia: The patient is a 65 y.o. female with a history of mammary hyperplasia for several years.  She has extremely large breasts causing symptoms that include the following: Back pain in the upper and lower back, including neck pain. She pulls or pins her bra straps to provide better lift and relief of the pressure and pain. She notices relief by holding her breast up manually.  Her shoulder straps cause grooves and pain and pressure that requires padding for relief. Pain medication is sometimes required with motrin and tylenol.  Activities that are hindered by enlarged breasts include: exercise and running.  She has tried supportive clothing as well as fitted bras without improvement.  Her breasts are extremely large and fairly symmetric***.  She has hyperpigmentation of the inframammary area on both sides.  The sternal to nipple distance on the right is *** cm and the left is *** cm.  The IMF distance is *** cm.  She is 5 feet *** inches tall and weighs *** pounds.  Preoperative bra size = *** cup.  The estimated excess breast tissue to be removed at the time of surgery = *** grams on the left and *** grams on the right.  Mammogram history: ***.  Family history of breast cancer:  ***.  Tobacco use:  ***.    Review of Systems  Past Medical History:  Diagnosis Date  . Arthritis   . Asthma   . Chronic back pain   . Depression   . Diabetes mellitus    hasn't take insulin since weight loss 2020  . Difficult intravenous access    required ultrasound for placement  . GERD (gastroesophageal reflux disease)    hx of   . Hyperlipidemia   . Hypertension   . Obesity   . Sleep apnea    uses O2 2 liters  / currently has no cpap     Past Surgical History:  Procedure Laterality Date  . ABDOMINAL HYSTERECTOMY    . BLADDER REPAIR W/  CESAREAN SECTION  1989  . CATARACT EXTRACTION W/PHACO Left 08/28/2019   Procedure: CATARACT EXTRACTION PHACO AND INTRAOCULAR LENS PLACEMENT (IOC) CDE: 18.46;  Surgeon: Baruch Goldmann, MD;  Location: [facility];  Service: Ophthalmology;  Laterality: Left;  . CATARACT EXTRACTION W/PHACO Right 10/02/2019   Procedure: CATARACT EXTRACTION PHACO AND INTRAOCULAR LENS PLACEMENT (IOC);  Surgeon: Baruch Goldmann, MD;  Location: [facility];  Service: Ophthalmology;  Laterality: Right;  CDE: 8.13  . GASTRIC ROUX-EN-Y N/A 06/24/2015   Procedure: LAPAROSCOPIC ROUX-EN-Y GASTRIC BYPASS WITH UPPER ENDOSCOPY;  Surgeon: Excell Seltzer, MD;  Location: [facility];  Service: General;  Laterality: N/A;  . knee left arthroscopy  2004  . PANNICULECTOMY Bilateral 09/04/2016   Procedure: PANNICULECTOMY;  Surgeon: Excell Seltzer, MD;  Location: [facility];  Service: General;  Laterality: Bilateral;  . right knee surgery  2007  . right rotator cuff surgery  11/26/2009  . VESICOVAGINAL FISTULA CLOSURE W/ TAH  2002      Current Outpatient Medications:  .  albuterol (PROVENTIL HFA;VENTOLIN HFA) 108 (90 BASE) MCG/ACT inhaler, Inhale 1-2 puffs into the lungs every 6 (six) hours as needed for wheezing. (Patient taking differently: Inhale 2 puffs into the lungs every 6 (six) hours as needed for wheezing.), Disp: 1 Inhaler, Rfl: 0 .  ALPRAZolam (  XANAX) 1 MG tablet, Take 1 mg by mouth 4 (four) times daily as needed for anxiety or sleep. , Disp: , Rfl:  .  aspirin EC 81 MG tablet, Take 81 mg by mouth daily., Disp: , Rfl:  .  calcium carbonate (OSCAL) 1500 (600 Ca) MG TABS tablet, Take 600 mg of elemental calcium by mouth 2 (two) times daily with a meal., Disp: , Rfl:  .  CARDIZEM LA 360 MG 24 hr tablet, Take 360 mg by mouth daily. , Disp: , Rfl:  .  cyclobenzaprine (FLEXERIL) 10 MG tablet, Take 10 mg by mouth 3 (three) times daily as needed for muscle spasms., Disp: , Rfl:  .  diclofenac (VOLTAREN) 75 MG EC tablet, Take 1 tablet (75 mg total) by  mouth 3 (three) times daily as needed for mild pain or moderate pain. (Patient taking differently: Take 75 mg by mouth daily.), Disp: 30 tablet, Rfl: 0 .  diclofenac sodium (VOLTAREN) 1 % GEL, Apply 2 g topically 4 (four) times daily as needed (back and knee pain). (Patient not taking: Reported on 04/01/2020), Disp: 1 Tube, Rfl: 0 .  ibuprofen (ADVIL,MOTRIN) 800 MG tablet, Take 800 mg by mouth daily as needed for moderate pain., Disp: , Rfl:  .  insulin glargine (LANTUS) 100 UNIT/ML injection, Inject 10 Units into the skin at bedtime as needed (blood sugar over 280). , Disp: , Rfl:  .  JANUVIA 100 MG tablet, Take 100 mg by mouth daily., Disp: , Rfl:  .  Multiple Vitamin (MULTIVITAMIN WITH MINERALS) TABS tablet, Take 1 tablet by mouth daily., Disp: , Rfl:  .  oxyCODONE-acetaminophen (PERCOCET) 10-325 MG tablet, Take 1 tablet by mouth every 6 (six) hours as needed for pain. , Disp: , Rfl:  .  phentermine 37.5 MG capsule, Take 37.5 mg by mouth every morning. , Disp: , Rfl:  .  polyethylene glycol (MIRALAX / GLYCOLAX) packet, Take 17 g by mouth daily as needed for mild constipation., Disp: , Rfl:  .  pravastatin (PRAVACHOL) 80 MG tablet, Take 80 mg by mouth every morning. , Disp: , Rfl:  .  vitamin B-12 (CYANOCOBALAMIN) 1000 MCG tablet, Take 2,000 mcg by mouth daily., Disp: , Rfl:    Objective:   There were no vitals filed for this visit.  Physical Exam  Assessment & Plan:  Symptomatic abdominal panniculus  Morbid obesity with BMI of 50.0-59.9, adult ([facility])  Symptomatic mammary hypertrophy  Neck pain  ***  Loel Lofty Rande Dario, DO

## 2020-08-15 NOTE — Progress Notes (Signed)
cancelled

## 2020-09-01 ENCOUNTER — Emergency Department (HOSPITAL_COMMUNITY): Payer: Medicare Other

## 2020-09-01 ENCOUNTER — Emergency Department (HOSPITAL_COMMUNITY)
Admission: EM | Admit: 2020-09-01 | Discharge: 2020-09-01 | Disposition: A | Discharge status: Home / Self Care | Payer: Medicare Other | Attending: Emergency Medicine | Admitting: Emergency Medicine

## 2020-09-01 ENCOUNTER — Other Ambulatory Visit: Payer: Self-pay

## 2020-09-01 DIAGNOSIS — I10 Essential (primary) hypertension: Secondary | ICD-10-CM

## 2020-09-01 DIAGNOSIS — Z794 Long term (current) use of insulin: Secondary | ICD-10-CM | POA: Diagnosis not present

## 2020-09-01 DIAGNOSIS — R079 Chest pain, unspecified: Secondary | ICD-10-CM | POA: Insufficient documentation

## 2020-09-01 DIAGNOSIS — Z79899 Other long term (current) drug therapy: Secondary | ICD-10-CM | POA: Diagnosis not present

## 2020-09-01 DIAGNOSIS — J45909 Unspecified asthma, uncomplicated: Secondary | ICD-10-CM | POA: Insufficient documentation

## 2020-09-01 DIAGNOSIS — S199XXA Unspecified injury of neck, initial encounter: Secondary | ICD-10-CM | POA: Diagnosis present

## 2020-09-01 DIAGNOSIS — Z7982 Long term (current) use of aspirin: Secondary | ICD-10-CM | POA: Diagnosis not present

## 2020-09-01 DIAGNOSIS — S29012A Strain of muscle and tendon of back wall of thorax, initial encounter: Secondary | ICD-10-CM | POA: Diagnosis not present

## 2020-09-01 DIAGNOSIS — K219 Gastro-esophageal reflux disease without esophagitis: Secondary | ICD-10-CM | POA: Insufficient documentation

## 2020-09-01 DIAGNOSIS — E119 Type 2 diabetes mellitus without complications: Secondary | ICD-10-CM | POA: Diagnosis not present

## 2020-09-01 DIAGNOSIS — Y92410 Unspecified street and highway as the place of occurrence of the external cause: Secondary | ICD-10-CM | POA: Diagnosis not present

## 2020-09-01 DIAGNOSIS — S161XXA Strain of muscle, fascia and tendon at neck level, initial encounter: Secondary | ICD-10-CM | POA: Diagnosis not present

## 2020-09-01 MED ORDER — METHOCARBAMOL 500 MG PO TABS
750.0000 mg | ORAL_TABLET | Freq: Once | ORAL | Status: AC
  Administered 2020-09-01: 750 mg via ORAL
  Filled 2020-09-01: qty 2

## 2020-09-01 MED ORDER — METHOCARBAMOL 750 MG PO TABS: 750.0000 mg | ORAL_TABLET | Freq: Four times a day (QID) | ORAL | 0 refills | Status: DC

## 2020-09-01 MED ORDER — DILTIAZEM HCL ER COATED BEADS 180 MG PO CP24
360.0000 mg | ORAL_CAPSULE | Freq: Once | ORAL | Status: AC
  Administered 2020-09-01: 360 mg via ORAL
  Filled 2020-09-01: qty 2

## 2020-09-01 MED ORDER — CLONIDINE HCL 0.1 MG PO TABS
0.1000 mg | ORAL_TABLET | Freq: Once | ORAL | Status: AC
  Administered 2020-09-01: 0.1 mg via ORAL
  Filled 2020-09-01: qty 1

## 2020-09-01 MED ORDER — KETOROLAC TROMETHAMINE 60 MG/2ML IM SOLN
60.0000 mg | Freq: Once | INTRAMUSCULAR | Status: AC
  Administered 2020-09-01: 60 mg via INTRAMUSCULAR
  Filled 2020-09-01: qty 2

## 2020-09-01 MED ORDER — IBUPROFEN 600 MG PO TABS: 600.0000 mg | ORAL_TABLET | Freq: Four times a day (QID) | ORAL | 0 refills | Status: AC | PRN
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
## 2020-09-01 NOTE — ED Notes (Signed)
Pt transported to Xray via stretcher
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
## 2020-09-01 NOTE — ED Notes (Signed)
Manual BP in L arm 184/100 and R arm 182/102.

## 2020-09-01 NOTE — ED Notes (Signed)
Patient very somnolent upon presenting discharge papers, provided PO food and fluids and encouraged to eat/drink.

## 2020-09-07 ENCOUNTER — Emergency Department (HOSPITAL_COMMUNITY): Payer: Medicare Other

## 2020-09-07 ENCOUNTER — Other Ambulatory Visit: Payer: Self-pay

## 2020-09-07 ENCOUNTER — Encounter (HOSPITAL_COMMUNITY): Payer: Self-pay

## 2020-09-07 ENCOUNTER — Emergency Department (HOSPITAL_COMMUNITY)
Admission: EM | Admit: 2020-09-07 | Discharge: 2020-09-07 | Disposition: A | Discharge status: Home / Self Care | Payer: Medicare Other | Attending: Emergency Medicine | Admitting: Emergency Medicine

## 2020-09-07 DIAGNOSIS — Z7982 Long term (current) use of aspirin: Secondary | ICD-10-CM | POA: Diagnosis not present

## 2020-09-07 DIAGNOSIS — R600 Localized edema: Secondary | ICD-10-CM | POA: Insufficient documentation

## 2020-09-07 DIAGNOSIS — Z7984 Long term (current) use of oral hypoglycemic drugs: Secondary | ICD-10-CM | POA: Insufficient documentation

## 2020-09-07 DIAGNOSIS — J45909 Unspecified asthma, uncomplicated: Secondary | ICD-10-CM | POA: Diagnosis not present

## 2020-09-07 DIAGNOSIS — R0781 Pleurodynia: Secondary | ICD-10-CM | POA: Insufficient documentation

## 2020-09-07 DIAGNOSIS — D649 Anemia, unspecified: Secondary | ICD-10-CM | POA: Insufficient documentation

## 2020-09-07 DIAGNOSIS — Y92410 Unspecified street and highway as the place of occurrence of the external cause: Secondary | ICD-10-CM | POA: Diagnosis not present

## 2020-09-07 DIAGNOSIS — R7989 Other specified abnormal findings of blood chemistry: Secondary | ICD-10-CM

## 2020-09-07 DIAGNOSIS — R791 Abnormal coagulation profile: Secondary | ICD-10-CM | POA: Diagnosis not present

## 2020-09-07 DIAGNOSIS — M79606 Pain in leg, unspecified: Secondary | ICD-10-CM

## 2020-09-07 DIAGNOSIS — M79605 Pain in left leg: Secondary | ICD-10-CM | POA: Insufficient documentation

## 2020-09-07 DIAGNOSIS — I10 Essential (primary) hypertension: Secondary | ICD-10-CM | POA: Insufficient documentation

## 2020-09-07 DIAGNOSIS — Z794 Long term (current) use of insulin: Secondary | ICD-10-CM | POA: Insufficient documentation

## 2020-09-07 DIAGNOSIS — Z79899 Other long term (current) drug therapy: Secondary | ICD-10-CM | POA: Diagnosis not present

## 2020-09-07 DIAGNOSIS — R0600 Dyspnea, unspecified: Secondary | ICD-10-CM | POA: Diagnosis not present

## 2020-09-07 DIAGNOSIS — R609 Edema, unspecified: Secondary | ICD-10-CM

## 2020-09-07 DIAGNOSIS — R079 Chest pain, unspecified: Secondary | ICD-10-CM | POA: Diagnosis present

## 2020-09-07 DIAGNOSIS — E119 Type 2 diabetes mellitus without complications: Secondary | ICD-10-CM | POA: Diagnosis not present

## 2020-09-07 LAB — CBC WITH DIFFERENTIAL/PLATELET
Abs Immature Granulocytes: 0.05 10*3/uL (ref 0.00–0.07)
Basophils Absolute: 0 10*3/uL (ref 0.0–0.1)
Basophils Relative: 0 %
Eosinophils Absolute: 0.1 10*3/uL (ref 0.0–0.5)
Eosinophils Relative: 1 %
HCT: 36.2 % (ref 36.0–46.0)
Hemoglobin: 11.3 g/dL — ABNORMAL LOW (ref 12.0–15.0)
Immature Granulocytes: 1 %
Lymphocytes Relative: 26 %
Lymphs Abs: 2.4 10*3/uL (ref 0.7–4.0)
MCH: 29.4 pg (ref 26.0–34.0)
MCHC: 31.2 g/dL (ref 30.0–36.0)
MCV: 94 fL (ref 80.0–100.0)
Monocytes Absolute: 0.7 10*3/uL (ref 0.1–1.0)
Monocytes Relative: 8 %
Neutro Abs: 6 10*3/uL (ref 1.7–7.7)
Neutrophils Relative %: 64 %
Platelets: 224 10*3/uL (ref 150–400)
RBC: 3.85 MIL/uL — ABNORMAL LOW (ref 3.87–5.11)
RDW: 13 % (ref 11.5–15.5)
WBC: 9.3 10*3/uL (ref 4.0–10.5)
nRBC: 0 % (ref 0.0–0.2)

## 2020-09-07 LAB — BASIC METABOLIC PANEL
Anion gap: 7 (ref 5–15)
BUN: 22 mg/dL (ref 8–23)
CO2: 24 mmol/L (ref 22–32)
Calcium: 8.5 mg/dL — ABNORMAL LOW (ref 8.9–10.3)
Chloride: 106 mmol/L (ref 98–111)
Creatinine, Ser: 0.77 mg/dL (ref 0.44–1.00)
GFR, Estimated: >60 mL/min (ref >60)
Glucose, Bld: 128 mg/dL — ABNORMAL HIGH (ref 70–99)
Potassium: 4.2 mmol/L (ref 3.5–5.1)
Sodium: 137 mmol/L (ref 135–145)

## 2020-09-07 LAB — BRAIN NATRIURETIC PEPTIDE: B Natriuretic Peptide: 18 pg/mL (ref 0.0–100.0)

## 2020-09-07 LAB — D-DIMER, QUANTITATIVE: D-Dimer, Quant: 0.77 ug{FEU}/mL — ABNORMAL HIGH (ref 0.00–0.50)

## 2020-09-07 LAB — TROPONIN I (HIGH SENSITIVITY)
Troponin I (High Sensitivity): 5 ng/L (ref <18)
Troponin I (High Sensitivity): 5 ng/L

## 2020-09-07 MED ORDER — DICLOFENAC SODIUM 1 % EX GEL: 2.0000 g | Freq: Four times a day (QID) | CUTANEOUS | 0 refills | Status: AC | PRN

## 2020-09-07 MED ORDER — IOHEXOL 350 MG/ML SOLN
75.0000 mL | Freq: Once | INTRAVENOUS | Status: AC | PRN
  Administered 2020-09-07: 75 mL via INTRAVENOUS
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
## 2020-09-07 NOTE — ED Provider Notes (Signed)
Blood pressure (!) 152/71, pulse 70, temperature 98.4 F (36.9 C), temperature source Oral, resp. rate 19, height 5\' 3"  (1.6 m), weight 77.1 kg, SpO2 99 %.  Assuming care from Dr. Roxanne Mins.  In short, Lisa Todd is a 65 y.o. female with a chief complaint of Chest Pain .  Refer to the original H&P for additional details.  The current plan of care is to follow up on DVT US and reassess. D-dimer is elevated.    EKG Interpretation  Date/Time:  Saturday Sep 07 2020 05:00:04 EDT Ventricular Rate:  75 PR Interval:  129 QRS Duration: 82 QT Interval:  374 QTC Calculation: 418 R Axis:   2 Text Interpretation: Sinus rhythm Normal ECG When compared with ECG of 05/02/2019, Premature atrial complexes are no longer present Confirmed by Delora Fuel ([id number]) on 09/07/2020 5:06:03 AM      12:45 PM  CTA of the chest reviewed.  Favor streak artifact of the SVC.  Chest pain does seem musculoskeletal.  Plan for close PCP follow-up.  Discussed ED return precautions.  She will take over-the-counter medications for discomfort. US of the legs show no DVT.     IMPRESSION:  1. Evaluation of right upper lobe pulmonary arterial branches is  limited centrally due to streak artifact off the SVC. An apparent  filling defect in a single branch is nonspecific. However, the  finding is favored to be secondary to streak artifact with a  solitary embolus considered less likely.  2. No other abnormalities.   Aortic Atherosclerosis (ICD10-I70.0).    Margette Fast, MD 09/07/20 1250
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
## 2020-09-07 NOTE — Discharge Instructions (Signed)
You were seen in the emergency department today with leg swelling and chest discomfort.  Your ultrasound of the legs and CT scan of the chest did not show evidence of blood clots.  You may continue over-the-counter medications and I have called in a prescription for Voltaren gel to your pharmacy.  Please call your primary care doctor on Monday to schedule a close follow-up appointment.  Return to the emergency department any new or suddenly worsening symptoms.

## 2020-09-10 ENCOUNTER — Ambulatory Visit (INDEPENDENT_AMBULATORY_CARE_PROVIDER_SITE_OTHER): Payer: Medicare Other | Admitting: Plastic Surgery

## 2020-09-10 ENCOUNTER — Other Ambulatory Visit: Payer: Self-pay

## 2020-09-10 ENCOUNTER — Encounter: Payer: Self-pay | Admitting: Plastic Surgery

## 2020-09-10 VITALS — BP 131/80 | HR 74 | Ht 63.0 in | Wt 191.0 lb

## 2020-09-10 DIAGNOSIS — M546 Pain in thoracic spine: Secondary | ICD-10-CM

## 2020-09-10 DIAGNOSIS — M793 Panniculitis, unspecified: Secondary | ICD-10-CM

## 2020-09-10 DIAGNOSIS — M545 Low back pain, unspecified: Secondary | ICD-10-CM

## 2020-09-10 DIAGNOSIS — G8929 Other chronic pain: Secondary | ICD-10-CM

## 2020-09-10 DIAGNOSIS — N62 Hypertrophy of breast: Secondary | ICD-10-CM | POA: Diagnosis not present
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
## 2020-09-12 DIAGNOSIS — M793 Panniculitis, unspecified: Secondary | ICD-10-CM | POA: Insufficient documentation

## 2020-09-27 ENCOUNTER — Ambulatory Visit: Payer: Medicare Other | Admitting: Plastic Surgery

## 2020-12-07 ENCOUNTER — Emergency Department (HOSPITAL_COMMUNITY): Payer: Medicare Other

## 2020-12-07 ENCOUNTER — Encounter (HOSPITAL_COMMUNITY): Payer: Self-pay

## 2020-12-07 ENCOUNTER — Emergency Department (HOSPITAL_COMMUNITY)
Admission: EM | Admit: 2020-12-07 | Discharge: 2020-12-07 | Disposition: A | Discharge status: Home / Self Care | Payer: Medicare Other | Attending: Emergency Medicine | Admitting: Emergency Medicine

## 2020-12-07 ENCOUNTER — Other Ambulatory Visit: Payer: Self-pay

## 2020-12-07 DIAGNOSIS — I10 Essential (primary) hypertension: Secondary | ICD-10-CM | POA: Diagnosis not present

## 2020-12-07 DIAGNOSIS — Z7982 Long term (current) use of aspirin: Secondary | ICD-10-CM | POA: Insufficient documentation

## 2020-12-07 DIAGNOSIS — R935 Abnormal findings on diagnostic imaging of other abdominal regions, including retroperitoneum: Secondary | ICD-10-CM | POA: Diagnosis not present

## 2020-12-07 DIAGNOSIS — E871 Hypo-osmolality and hyponatremia: Secondary | ICD-10-CM | POA: Diagnosis not present

## 2020-12-07 DIAGNOSIS — R350 Frequency of micturition: Secondary | ICD-10-CM | POA: Diagnosis not present

## 2020-12-07 DIAGNOSIS — E119 Type 2 diabetes mellitus without complications: Secondary | ICD-10-CM | POA: Diagnosis not present

## 2020-12-07 DIAGNOSIS — M545 Low back pain, unspecified: Secondary | ICD-10-CM | POA: Insufficient documentation

## 2020-12-07 DIAGNOSIS — J45909 Unspecified asthma, uncomplicated: Secondary | ICD-10-CM | POA: Insufficient documentation

## 2020-12-07 DIAGNOSIS — Z794 Long term (current) use of insulin: Secondary | ICD-10-CM | POA: Diagnosis not present

## 2020-12-07 LAB — URINALYSIS, ROUTINE W REFLEX MICROSCOPIC
Bilirubin Urine: NEGATIVE
Glucose, UA: NEGATIVE mg/dL
Hgb urine dipstick: NEGATIVE
Ketones, ur: NEGATIVE mg/dL
Leukocytes,Ua: NEGATIVE
Nitrite: NEGATIVE
Protein, ur: NEGATIVE mg/dL
Specific Gravity, Urine: 1.021 (ref 1.005–1.030)
pH: 5 (ref 5.0–8.0)

## 2020-12-07 LAB — CBC WITH DIFFERENTIAL/PLATELET
Abs Immature Granulocytes: 0.01 10*3/uL (ref 0.00–0.07)
Basophils Absolute: 0 10*3/uL (ref 0.0–0.1)
Basophils Relative: 1 %
Eosinophils Absolute: 0.1 10*3/uL (ref 0.0–0.5)
Eosinophils Relative: 1 %
HCT: 38.1 % (ref 36.0–46.0)
Hemoglobin: 12.2 g/dL (ref 12.0–15.0)
Immature Granulocytes: 0 %
Lymphocytes Relative: 34 %
Lymphs Abs: 1.3 10*3/uL (ref 0.7–4.0)
MCH: 30.9 pg (ref 26.0–34.0)
MCHC: 32 g/dL (ref 30.0–36.0)
MCV: 96.5 fL (ref 80.0–100.0)
Monocytes Absolute: 0.4 10*3/uL (ref 0.1–1.0)
Monocytes Relative: 11 %
Neutro Abs: 2 10*3/uL (ref 1.7–7.7)
Neutrophils Relative %: 53 %
Platelets: 120 10*3/uL — ABNORMAL LOW (ref 150–400)
RBC: 3.95 MIL/uL (ref 3.87–5.11)
RDW: 12.7 % (ref 11.5–15.5)
WBC: 3.8 10*3/uL — ABNORMAL LOW (ref 4.0–10.5)
nRBC: 0 % (ref 0.0–0.2)

## 2020-12-07 LAB — BASIC METABOLIC PANEL
Anion gap: 5 (ref 5–15)
BUN: 14 mg/dL (ref 8–23)
CO2: 27 mmol/L (ref 22–32)
Calcium: 8.5 mg/dL — ABNORMAL LOW (ref 8.9–10.3)
Chloride: 102 mmol/L (ref 98–111)
Creatinine, Ser: 0.72 mg/dL (ref 0.44–1.00)
GFR, Estimated: >60 mL/min (ref >60)
Glucose, Bld: 105 mg/dL — ABNORMAL HIGH (ref 70–99)
Potassium: 3.8 mmol/L (ref 3.5–5.1)
Sodium: 134 mmol/L — ABNORMAL LOW (ref 135–145)

## 2020-12-07 MED ORDER — CYCLOBENZAPRINE HCL 10 MG PO TABS: 10.0000 mg | ORAL_TABLET | Freq: Two times a day (BID) | ORAL | 0 refills | Status: DC | PRN

## 2020-12-07 MED ORDER — HYDROMORPHONE HCL 1 MG/ML IJ SOLN
1.0000 mg | Freq: Once | INTRAMUSCULAR | Status: AC
  Administered 2020-12-07: 1 mg via INTRAMUSCULAR
  Filled 2020-12-07: qty 1
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
## 2020-12-07 NOTE — ED Notes (Signed)
After assisting pt back from CT noted pt wearing 3 waist trainers. Pt states she wears them all the time and takes them off while sleeping.

## 2020-12-07 NOTE — ED Triage Notes (Signed)
Pt. States they their lower back hurts. Pt.states they are urinating frequently and feel a lot of pressure and some burning when the urinate.

## 2020-12-07 NOTE — Discharge Instructions (Addendum)
You have been seen here for back pain. I recommend taking over-the-counter pain medications like ibuprofen and/or Tylenol every 6 as needed.  Please follow dosage and on the back of bottle.  I also recommend applying heat to the area and stretching out the muscles as this will help decrease stiffness and pain.  I have given you information on exercises please follow. I have also given you a prescription for a muscle relaxer this can make you drowsy do not consume alcohol or operate heavy machinery when taking this medication.  Please follow-up with neurosurgery for further evaluation.  Urinary frequency please follow-up with urology for further evaluation.  Come back to the emergency department if you develop chest pain, shortness of breath, severe abdominal pain, uncontrolled nausea, vomiting, diarrhea.

## 2020-12-07 NOTE — ED Notes (Signed)
Patient transported to CT 

## 2020-12-07 NOTE — ED Notes (Signed)
Discharge instructions discussed with pt. Pt verbalized understanding with no questions at this time. Pt to go home with niece.

## 2020-12-10 ENCOUNTER — Telehealth: Payer: Medicare Other | Admitting: Plastic Surgery

## 2021-01-06 ENCOUNTER — Other Ambulatory Visit: Payer: Self-pay

## 2021-01-06 ENCOUNTER — Encounter (HOSPITAL_COMMUNITY): Payer: Self-pay | Admitting: *Deleted

## 2021-01-06 ENCOUNTER — Emergency Department (HOSPITAL_COMMUNITY)
Admission: EM | Admit: 2021-01-06 | Discharge: 2021-01-06 | Disposition: A | Discharge status: Home / Self Care | Payer: Medicare Other | Attending: Emergency Medicine | Admitting: Emergency Medicine

## 2021-01-06 DIAGNOSIS — Z79899 Other long term (current) drug therapy: Secondary | ICD-10-CM | POA: Insufficient documentation

## 2021-01-06 DIAGNOSIS — Z20822 Contact with and (suspected) exposure to covid-19: Secondary | ICD-10-CM | POA: Diagnosis not present

## 2021-01-06 DIAGNOSIS — E119 Type 2 diabetes mellitus without complications: Secondary | ICD-10-CM | POA: Insufficient documentation

## 2021-01-06 DIAGNOSIS — I10 Essential (primary) hypertension: Secondary | ICD-10-CM | POA: Insufficient documentation

## 2021-01-06 DIAGNOSIS — Z794 Long term (current) use of insulin: Secondary | ICD-10-CM | POA: Insufficient documentation

## 2021-01-06 DIAGNOSIS — J45909 Unspecified asthma, uncomplicated: Secondary | ICD-10-CM | POA: Diagnosis not present

## 2021-01-06 DIAGNOSIS — Z7982 Long term (current) use of aspirin: Secondary | ICD-10-CM | POA: Insufficient documentation

## 2021-01-06 LAB — RESP PANEL BY RT-PCR (FLU A&B, COVID) ARPGX2
Influenza A by PCR: NEGATIVE
Influenza B by PCR: NEGATIVE
SARS Coronavirus 2 by RT PCR: NEGATIVE

## 2021-01-06 NOTE — ED Provider Notes (Signed)
[facility] EMERGENCY DEPARTMENT Provider Note  CSN: [id number] Arrival date & time: 01/06/21 0732    History Chief Complaint  Patient presents with   Covid Exposure    Lisa Todd is a 65 y.o. female reports she was exposed to Covid a week ago, did a 7 day quarantine at home. She had some nasal congestion initially but is currently asymptomatic. She did home Covid tests which were negative, but her work would not let her come back today without a negative test from a doctor's office. Her PCP did not have any tests and so she came here.    Past Medical History:  Diagnosis Date   Arthritis    Asthma    Chronic back pain    Depression    Diabetes mellitus    hasn't take insulin since weight loss 2020   Difficult intravenous access    required ultrasound for placement   GERD (gastroesophageal reflux disease)    hx of    Hyperlipidemia    Hypertension    Obesity    Sleep apnea    uses O2 2 liters  / currently has no cpap     Past Surgical History:  Procedure Laterality Date   ABDOMINAL HYSTERECTOMY     BLADDER REPAIR W/ CESAREAN SECTION  1989   CATARACT EXTRACTION W/PHACO Left 08/28/2019   Procedure: CATARACT EXTRACTION PHACO AND INTRAOCULAR LENS PLACEMENT ([place]) CDE: 18.46;  Surgeon: Baruch Goldmann, MD;  Location: [facility];  Service: Ophthalmology;  Laterality: Left;   CATARACT EXTRACTION W/PHACO Right 10/02/2019   Procedure: CATARACT EXTRACTION PHACO AND INTRAOCULAR LENS PLACEMENT ([facility]);  Surgeon: Baruch Goldmann, MD;  Location: [facility];  Service: Ophthalmology;  Laterality: Right;  CDE: 8.13   GASTRIC ROUX-EN-Y N/A 06/24/2015   Procedure: LAPAROSCOPIC ROUX-EN-Y GASTRIC BYPASS WITH UPPER ENDOSCOPY;  Surgeon: Excell Seltzer, MD;  Location: [facility];  Service: General;  Laterality: N/A;   knee left arthroscopy  2004   PANNICULECTOMY Bilateral 09/04/2016   Procedure: PANNICULECTOMY;  Surgeon: Excell Seltzer, MD;  Location: [facility];  Service: General;  Laterality: Bilateral;    right knee surgery  2007   right rotator cuff surgery  11/26/2009   VESICOVAGINAL FISTULA CLOSURE W/ TAH  2002    Family History  Problem Relation Age of Onset   Breast cancer Mother    Diabetes Mother    Hypertension Mother    Asthma Father    Diabetes Father    Hypertension Father    Diabetes Brother    Hypertension Brother    Diabetes Sister    Hypertension Sister     Social History   Tobacco Use   Smoking status: Never   Smokeless tobacco: Never  Vaping Use   Vaping Use: Never used  Substance Use Topics   Alcohol use: No   Drug use: No     Home Medications Prior to Admission medications   Medication Sig Start Date End Date Taking? Authorizing Provider  albuterol (PROVENTIL HFA;VENTOLIN HFA) 108 (90 BASE) MCG/ACT inhaler Inhale 1-2 puffs into the lungs every 6 (six) hours as needed for wheezing. Patient taking differently: Inhale 2 puffs into the lungs every 6 (six) hours as needed for wheezing. 08/05/12   Prentiss Bells, MD  ALPRAZolam Duanne Moron) 1 MG tablet Take 1 mg by mouth 4 (four) times daily as needed for anxiety or sleep.  09/19/13   [provider]  aspirin EC 81 MG tablet Take 81 mg by mouth daily.    [provider]  calcium carbonate (OSCAL) 1500 (600 Ca) MG TABS tablet Take 600 mg of elemental calcium by mouth 2 (two) times daily with a meal.    [provider]  CARDIZEM LA 360 MG 24 hr tablet Take 360 mg by mouth daily.  05/06/15   [provider]  cyclobenzaprine (FLEXERIL) 10 MG tablet Take 1 tablet (10 mg total) by mouth 2 (two) times daily as needed for muscle spasms. 12/07/20   Marcello Fennel, PA-C  diclofenac (VOLTAREN) 75 MG EC tablet Take 1 tablet (75 mg total) by mouth 3 (three) times daily as needed for mild pain or moderate pain. Patient taking differently: Take 75 mg by mouth daily. 02/04/19   Noemi Chapel, MD  diclofenac Sodium (VOLTAREN) 1 % GEL Apply 2 g topically 4 (four) times daily as needed. 09/07/20    Long, Wonda Olds, MD  ibuprofen (ADVIL) 600 MG tablet Take 1 tablet (600 mg total) by mouth every 6 (six) hours as needed. 09/01/20   Charlesetta Shanks, MD  ibuprofen (ADVIL,MOTRIN) 800 MG tablet Take 800 mg by mouth daily as needed for moderate pain.    [provider]  insulin glargine (LANTUS) 100 UNIT/ML injection Inject 10 Units into the skin at bedtime as needed (blood sugar over 280).  02/20/11 04/01/20  Fayrene Helper, MD  JANUVIA 100 MG tablet Take 100 mg by mouth daily. Patient not taking: Reported on 09/10/2020 05/06/15   [provider]  methocarbamol (ROBAXIN-750) 750 MG tablet Take 1 tablet (750 mg total) by mouth 4 (four) times daily. Patient not taking: Reported on 09/10/2020 09/01/20   Charlesetta Shanks, MD  Multiple Vitamin (MULTIVITAMIN WITH MINERALS) TABS tablet Take 1 tablet by mouth daily.    [provider]  oxyCODONE-acetaminophen (PERCOCET) 10-325 MG tablet Take 1 tablet by mouth every 6 (six) hours as needed for pain.  05/10/15   [provider]  phentermine 37.5 MG capsule Take 37.5 mg by mouth every morning.     [provider]  polyethylene glycol (MIRALAX / GLYCOLAX) packet Take 17 g by mouth daily as needed for mild constipation. Patient not taking: Reported on 09/10/2020    [provider]  pravastatin (PRAVACHOL) 80 MG tablet Take 80 mg by mouth every morning.  06/07/15   [provider]  vitamin B-12 (CYANOCOBALAMIN) 1000 MCG tablet Take 2,000 mcg by mouth daily.    [provider]     Allergies    Liraglutide   Review of Systems   Review of Systems A comprehensive review of systems was completed and negative except as noted in HPI.    Physical Exam BP (!) 175/90   Pulse 62   Temp 98.3 F (36.8 C) (Oral)   Resp 20   Ht '5\' 3"'$  (1.6 m)   Wt 68 kg   SpO2 100%   BMI 26.57 kg/m   Physical Exam Vitals and nursing note reviewed.  HENT:     Head: Normocephalic.     Nose: Nose normal.   Eyes:     Extraocular Movements: Extraocular movements intact.  Pulmonary:     Effort: Pulmonary effort is normal.  Musculoskeletal:        General: Normal range of motion.     Cervical back: Neck supple.  Skin:    Findings: No rash (on exposed skin).  Neurological:     Mental Status: She is alert and oriented to person, place, and time.  Psychiatric:  Mood and Affect: Mood normal.     ED Results / Procedures / Treatments   Labs (all labs ordered are listed, but only abnormal results are displayed) Labs Reviewed  RESP PANEL BY RT-PCR (FLU A&B, COVID) [id number]    EKG None  Radiology No results found.  Procedures Procedures  Medications Ordered in the ED Medications - No data to display   MDM Rules/Calculators/A&P MDM Will check Covid swab here to facilitate patient's return to work. Even if positive, she has completed a 5 day quarantine and would be cleared to go back to work.   ED Course  I have reviewed the triage vital signs and the nursing notes.  Pertinent labs & imaging results that were available during my care of the patient were reviewed by me and considered in my medical decision making (see chart for details).  Clinical Course as of 01/06/21 1023  Mon Jan 06, 2021  1014 Covid is neg. Patient provided with paper proof for work. Otherwise ready for discharge.  [CS]    Clinical Course User Index [CS] Truddie Hidden, MD    Final Clinical Impression(s) / ED Diagnoses Final diagnoses:  Close exposure to COVID-19 virus    Rx / DC Orders ED Discharge Orders     None        Truddie Hidden, MD 01/06/21 1024

## 2021-01-06 NOTE — ED Triage Notes (Signed)
Pt reports she was exposed to Covid about 1 week ago and has been quarantining for 7 days. Pt reports stuffy nose a few days ago, but no other symptoms. Pt reports she has taken 2 home Covid tests which were negative but her work will not let her return until she has a Covid test by medical facility with written test results. She went to her PCP first and they didn't have anymore Covid tests.

## 2021-01-06 NOTE — ED Notes (Signed)
Pt given breakfast tray

## 2021-06-09 ENCOUNTER — Other Ambulatory Visit: Payer: Self-pay

## 2021-06-09 ENCOUNTER — Encounter (HOSPITAL_COMMUNITY): Payer: Self-pay | Admitting: Emergency Medicine

## 2021-06-09 ENCOUNTER — Emergency Department (HOSPITAL_COMMUNITY)
Admission: EM | Admit: 2021-06-09 | Discharge: 2021-06-10 | Disposition: A | Discharge status: Home / Self Care | Payer: Medicare Other | Attending: Emergency Medicine | Admitting: Emergency Medicine

## 2021-06-09 ENCOUNTER — Emergency Department (HOSPITAL_COMMUNITY): Payer: Medicare Other

## 2021-06-09 DIAGNOSIS — Z7984 Long term (current) use of oral hypoglycemic drugs: Secondary | ICD-10-CM | POA: Diagnosis not present

## 2021-06-09 DIAGNOSIS — M25562 Pain in left knee: Secondary | ICD-10-CM | POA: Insufficient documentation

## 2021-06-09 DIAGNOSIS — Z7982 Long term (current) use of aspirin: Secondary | ICD-10-CM | POA: Diagnosis not present

## 2021-06-09 DIAGNOSIS — Z79899 Other long term (current) drug therapy: Secondary | ICD-10-CM | POA: Insufficient documentation

## 2021-06-09 DIAGNOSIS — G8929 Other chronic pain: Secondary | ICD-10-CM | POA: Diagnosis not present

## 2021-06-09 DIAGNOSIS — Z794 Long term (current) use of insulin: Secondary | ICD-10-CM | POA: Diagnosis not present

## 2021-06-09 DIAGNOSIS — I10 Essential (primary) hypertension: Secondary | ICD-10-CM | POA: Insufficient documentation

## 2021-06-09 DIAGNOSIS — J45909 Unspecified asthma, uncomplicated: Secondary | ICD-10-CM | POA: Insufficient documentation

## 2021-06-09 DIAGNOSIS — E119 Type 2 diabetes mellitus without complications: Secondary | ICD-10-CM | POA: Insufficient documentation

## 2021-06-09 MED ORDER — KETOROLAC TROMETHAMINE 30 MG/ML IJ SOLN
30.0000 mg | Freq: Once | INTRAMUSCULAR | Status: AC
  Administered 2021-06-09: 30 mg via INTRAMUSCULAR
  Filled 2021-06-09: qty 1

## 2021-06-09 NOTE — ED Triage Notes (Signed)
Pt c/o left knee pain x 2 weeks. Pt denies any injury.

## 2021-06-10 ENCOUNTER — Emergency Department (HOSPITAL_COMMUNITY): Payer: Medicare Other

## 2021-06-10 DIAGNOSIS — M25562 Pain in left knee: Secondary | ICD-10-CM | POA: Diagnosis not present

## 2021-06-10 NOTE — Discharge Instructions (Signed)
You were seen today for ongoing knee pain.  You need to follow-up with orthopedics.  You have tricompartmental osteoarthritis.  Continue your pain medications at home.  Use ice as needed.

## 2021-06-10 NOTE — ED Provider Notes (Signed)
[facility] Provider Note   CSN: [id number] Arrival date & time: 06/09/21  2255     History  Chief Complaint  Patient presents with   Knee Pain    Lisa Todd is a 66 y.o. female.  HPI     This is a 66 year old female with a history of hypertension, hyperlipidemia, osteoarthritis who presents with left knee pain.  Patient reports worsening left knee pain over the last 1 to 2 weeks.  It is worse with ambulation and weightbearing.  No fevers.  She has not noted any redness or effusion.  Patient reports a history of the same.  She takes Percocet and reports minimal relief.  She states that she needs surgery; however, she is not a good candidate.  She does see an orthopedist.  Denies any recent trauma.  Home Medications Prior to Admission medications   Medication Sig Start Date End Date Taking? Authorizing Provider  albuterol (PROVENTIL HFA;VENTOLIN HFA) 108 (90 BASE) MCG/ACT inhaler Inhale 1-2 puffs into the lungs every 6 (six) hours as needed for wheezing. Patient taking differently: Inhale 2 puffs into the lungs every 6 (six) hours as needed for wheezing. 08/05/12   Prentiss Bells, MD  ALPRAZolam Duanne Moron) 1 MG tablet Take 1 mg by mouth 4 (four) times daily as needed for anxiety or sleep.  09/19/13   [provider]  aspirin EC 81 MG tablet Take 81 mg by mouth daily.    [provider]  calcium carbonate (OSCAL) 1500 (600 Ca) MG TABS tablet Take 600 mg of elemental calcium by mouth 2 (two) times daily with a meal.    [provider]  CARDIZEM LA 360 MG 24 hr tablet Take 360 mg by mouth daily.  05/06/15   [provider]  cyclobenzaprine (FLEXERIL) 10 MG tablet Take 1 tablet (10 mg total) by mouth 2 (two) times daily as needed for muscle spasms. 12/07/20   Marcello Fennel, PA-C  diclofenac (VOLTAREN) 75 MG EC tablet Take 1 tablet (75 mg total) by mouth 3 (three) times daily as needed for mild pain or moderate pain. Patient  taking differently: Take 75 mg by mouth daily. 02/04/19   Noemi Chapel, MD  diclofenac Sodium (VOLTAREN) 1 % GEL Apply 2 g topically 4 (four) times daily as needed. 09/07/20   Long, Wonda Olds, MD  ibuprofen (ADVIL) 600 MG tablet Take 1 tablet (600 mg total) by mouth every 6 (six) hours as needed. 09/01/20   Charlesetta Shanks, MD  ibuprofen (ADVIL,MOTRIN) 800 MG tablet Take 800 mg by mouth daily as needed for moderate pain.    [provider]  insulin glargine (LANTUS) 100 UNIT/ML injection Inject 10 Units into the skin at bedtime as needed (blood sugar over 280).  02/20/11 04/01/20  Fayrene Helper, MD  JANUVIA 100 MG tablet Take 100 mg by mouth daily. Patient not taking: Reported on 09/10/2020 05/06/15   [provider]  methocarbamol (ROBAXIN-750) 750 MG tablet Take 1 tablet (750 mg total) by mouth 4 (four) times daily. Patient not taking: Reported on 09/10/2020 09/01/20   Charlesetta Shanks, MD  Multiple Vitamin (MULTIVITAMIN WITH MINERALS) TABS tablet Take 1 tablet by mouth daily.    [provider]  oxyCODONE-acetaminophen (PERCOCET) 10-325 MG tablet Take 1 tablet by mouth every 6 (six) hours as needed for pain.  05/10/15   [provider]  phentermine 37.5 MG capsule Take 37.5 mg by mouth every morning.     [provider]  polyethylene glycol (MIRALAX / GLYCOLAX) packet Take 17 g by mouth daily as needed for mild constipation. Patient not taking: Reported on 09/10/2020    [provider]  pravastatin (PRAVACHOL) 80 MG tablet Take 80 mg by mouth every morning.  06/07/15   [provider]  vitamin B-12 (CYANOCOBALAMIN) 1000 MCG tablet Take 2,000 mcg by mouth daily.    [provider]      Allergies    Liraglutide    Review of Systems   Review of Systems  Constitutional:  Negative for fever.  Musculoskeletal:        Knee pain  Skin:  Negative for color change and wound.  All other systems reviewed and are  negative.  Physical Exam Updated Vital Signs BP (!) 149/84    Pulse 78    Temp 98.1 F (36.7 C)    Resp 16    Ht 1.6 m (5\' 3" )    Wt 68 kg    SpO2 100%    BMI 26.56 kg/m  Physical Exam Vitals and nursing note reviewed.  Constitutional:      Appearance: She is well-developed. She is obese. She is not ill-appearing.  HENT:     Head: Normocephalic and atraumatic.     Nose: Nose normal.     Mouth/Throat:     Mouth: Mucous membranes are moist.  Eyes:     Pupils: Pupils are equal, round, and reactive to light.  Cardiovascular:     Rate and Rhythm: Normal rate and regular rhythm.  Pulmonary:     Effort: Pulmonary effort is normal. No respiratory distress.  Abdominal:     Palpations: Abdomen is soft.  Musculoskeletal:     Cervical back: Neck supple.     Comments: Focused examination of the left knee with normal range of motion, no significant effusion, no overlying skin changes, no warmth or erythema, crepitus noted, 2+ DP pulse distally  Skin:    General: Skin is warm and dry.  Neurological:     Mental Status: She is alert and oriented to person, place, and time.  Psychiatric:        Mood and Affect: Mood normal.    ED Results / Procedures / Treatments   Labs (all labs ordered are listed, but only abnormal results are displayed) Labs Reviewed - No data to display  EKG None  Radiology DG Knee Complete 4 Views Left  Result Date: 06/10/2021 CLINICAL DATA:  Left knee pain without injury. EXAM: LEFT KNEE - COMPLETE 4+ VIEW COMPARISON:  Study of 02/04/2019. FINDINGS: Is increased subcutaneous stranding edema in the visualized portion of the left lower extremity. Osteopenia is seen without evidence of fractures there are scattered calcifications in the femoral, popliteal and popliteal trifurcation arteries. There is a small suprapatellar bursal effusion, which was seen previously. There is osteopenia without evidence of fractures. There are enthesopathic changes of the anterior aspect  patella. Advanced tricompartmental degenerative arthrosis is again noted with bulky tricompartmental marginal osteophytes, joint space loss which is greatest in the medial femorotibial joint and with remodeling of opposing surfaces and increased subcortical cysts along both sides of the medial femorotibial joint. IMPRESSION: 1. Subcutaneous stranding edema. 2. Small suprapatellar effusion. 3. Osteopenia and degenerative change without evidence of fractures with advanced tricompartmental degenerative arthrosis showing progression particularly in the medial compartment. 4. Vascular calcifications. Electronically Signed   By: Telford Nab M.D.   On: 06/10/2021 00:54    Procedures Procedures    Medications Ordered in ED Medications  ketorolac (TORADOL) 30 MG/ML injection 30 mg (30 mg Intramuscular Given 06/09/21 2355)    ED Course/ Medical Decision Making/ A&P                           Medical Decision Making Amount and/or Complexity of Data Reviewed Radiology: ordered.  Risk Prescription drug management.   This patient presents to the ED for concern of left knee pain, this involves an extensive number of treatment options, and is a complaint that carries with it a high risk of complications and morbidity.  The differential diagnosis includes known osteoarthritis, effusion, less likely septic arthritis  MDM:    Patient presents with ongoing and worsening left knee pain.  She is nontoxic and vital signs reassuring.  Denies any new trauma.  No signs or symptoms of infection on exam.  She is clinically well-appearing.  Suspect her known osteoarthritis.  While she has lost a significant amount of weight, she is still obese and this probably exacerbates her symptoms.  She is neurologically intact.  Repeat x-ray showed persistent tricompartmental osteoarthritis.  Patient prior labs reviewed.  Prior normal creatinine.  She was given a dose of IM Toradol.  She takes Percocet at home.  Recommended  ongoing pain management and follow-up with her orthopedist. (Labs, imaging)  Labs: I Ordered, and personally interpreted labs.  The pertinent results include: None  Imaging Studies ordered: I ordered imaging studies including x-rays with tricompartmental arthritis I independently visualized and interpreted imaging. I agree with the radiologist interpretation  Additional history obtained from chart review.  External records from outside source obtained and reviewed including prior visits  Critical Interventions: IM Toradol  Consultations: I requested consultation with the none,  and discussed lab and imaging findings as well as pertinent plan - they recommend: None  Cardiac Monitoring: The patient was maintained on a cardiac monitor.  I personally viewed and interpreted the cardiac monitored which showed an underlying rhythm of: Normal sinus rhythm  Reevaluation: After the interventions noted above, I reevaluated the patient and found that they have :improved   Considered admission for: N/A  Social Determinants of Health: Lives independent  Disposition: Discharge  Co morbidities that complicate the patient evaluation  Past Medical History:  Diagnosis Date   Arthritis    Asthma    Chronic back pain    Depression    Diabetes mellitus    hasn't take insulin since weight loss 2020   Difficult intravenous access    required ultrasound for placement   GERD (gastroesophageal reflux disease)    hx of    Hyperlipidemia    Hypertension    Obesity    Sleep apnea    uses O2 2 liters  / currently has no cpap      Medicines Meds ordered this encounter  Medications   ketorolac (TORADOL) 30 MG/ML injection 30 mg    I have reviewed the patients home medicines and have made adjustments as needed  Problem List / ED Course: Problem List Items Addressed This Visit   None Visit Diagnoses     Chronic pain of left knee    -  Primary   Relevant Medications   ketorolac  (TORADOL) 30 MG/ML injection 30 mg (Completed)                   Final Clinical Impression(s) / ED Diagnoses Final diagnoses:  Chronic pain of left knee    Rx / DC Orders  ED Discharge Orders     None         Jobina Maita, Barbette Hair, MD 06/10/21 0111

## 2021-08-25 ENCOUNTER — Other Ambulatory Visit: Payer: Self-pay

## 2021-08-25 ENCOUNTER — Emergency Department (HOSPITAL_COMMUNITY)
Admission: EM | Admit: 2021-08-25 | Discharge: 2021-08-25 | Disposition: A | Discharge status: Home / Self Care | Payer: Medicare Other | Attending: Emergency Medicine | Admitting: Emergency Medicine

## 2021-08-25 ENCOUNTER — Encounter (HOSPITAL_COMMUNITY): Payer: Self-pay

## 2021-08-25 DIAGNOSIS — Z79899 Other long term (current) drug therapy: Secondary | ICD-10-CM | POA: Diagnosis not present

## 2021-08-25 DIAGNOSIS — J45909 Unspecified asthma, uncomplicated: Secondary | ICD-10-CM | POA: Diagnosis not present

## 2021-08-25 DIAGNOSIS — N390 Urinary tract infection, site not specified: Secondary | ICD-10-CM | POA: Diagnosis present

## 2021-08-25 DIAGNOSIS — R110 Nausea: Secondary | ICD-10-CM | POA: Diagnosis not present

## 2021-08-25 DIAGNOSIS — R350 Frequency of micturition: Secondary | ICD-10-CM

## 2021-08-25 DIAGNOSIS — I10 Essential (primary) hypertension: Secondary | ICD-10-CM | POA: Diagnosis not present

## 2021-08-25 DIAGNOSIS — M25562 Pain in left knee: Secondary | ICD-10-CM | POA: Insufficient documentation

## 2021-08-25 DIAGNOSIS — Z7982 Long term (current) use of aspirin: Secondary | ICD-10-CM | POA: Diagnosis not present

## 2021-08-25 LAB — URINALYSIS, ROUTINE W REFLEX MICROSCOPIC
Bilirubin Urine: NEGATIVE
Glucose, UA: NEGATIVE mg/dL
Hgb urine dipstick: NEGATIVE
Ketones, ur: NEGATIVE mg/dL
Nitrite: NEGATIVE
Protein, ur: NEGATIVE mg/dL
Specific Gravity, Urine: 1.02 (ref 1.005–1.030)
pH: 5 (ref 5.0–8.0)

## 2021-08-25 MED ORDER — ONDANSETRON 8 MG PO TBDP
8.0000 mg | ORAL_TABLET | Freq: Once | ORAL | Status: AC
  Administered 2021-08-25: 8 mg via ORAL
  Filled 2021-08-25: qty 1

## 2021-08-25 MED ORDER — KETOROLAC TROMETHAMINE 60 MG/2ML IM SOLN
30.0000 mg | Freq: Once | INTRAMUSCULAR | Status: AC
  Administered 2021-08-25: 30 mg via INTRAMUSCULAR
  Filled 2021-08-25: qty 2

## 2021-08-25 NOTE — Discharge Instructions (Signed)
Your urine did not show any sign of infection, or any other abnormality.  If you continue to have problems with going to the bathroom all the time, follow-up with your primary care provider or with the urologist. ?

## 2021-08-25 NOTE — ED Notes (Signed)
ED Provider at bedside. 

## 2021-08-25 NOTE — ED Triage Notes (Signed)
Pov from home. Cc of uti x1 week. Unable to get into pcp.  ?

## 2021-08-25 NOTE — ED Provider Notes (Signed)
?[facility] ?Provider Note ? ? ?CSN: [id number] ?Arrival date & time: 08/25/21  0107 ? ?  ? ?History ? ?Chief Complaint  ?Patient presents with  ? Urinary Tract Infection  ? ? ?Lisa Todd is a 66 y.o. female. ? ?The history is provided by the patient.  ?Urinary Tract Infection ?She has history of hypertension, hyperlipidemia, asthma, arthritis, chronic back pain and comes in complaining of urinary tract infection.  For the last week, she has had urinary urgency, frequency, tenesmus, with mild dysuria.  She has also had some pain across her lower back.  She denies fever, chills, sweats.  There has been some mild nausea today but no vomiting.  Has a second complaint, she has arthritis in her left knee and it has cleared up and she is requesting a an injection for that.  She is under the care of an orthopedist, and is scheduled to see him in about 3 weeks. ?  ?Home Medications ?Prior to Admission medications   ?Medication Sig Start Date End Date Taking? Authorizing Provider  ?albuterol (PROVENTIL HFA;VENTOLIN HFA) 108 (90 BASE) MCG/ACT inhaler Inhale 1-2 puffs into the lungs every 6 (six) hours as needed for wheezing. ?Patient taking differently: Inhale 2 puffs into the lungs every 6 (six) hours as needed for wheezing. 08/05/12   Prentiss Bells, MD  ?ALPRAZolam Duanne Moron) 1 MG tablet Take 1 mg by mouth 4 (four) times daily as needed for anxiety or sleep.  09/19/13   [provider]  ?aspirin EC 81 MG tablet Take 81 mg by mouth daily.    [provider]  ?calcium carbonate (OSCAL) 1500 (600 Ca) MG TABS tablet Take 600 mg of elemental calcium by mouth 2 (two) times daily with a meal.    [provider]  ?CARDIZEM LA 360 MG 24 hr tablet Take 360 mg by mouth daily.  05/06/15   [provider]  ?cyclobenzaprine (FLEXERIL) 10 MG tablet Take 1 tablet (10 mg total) by mouth 2 (two) times daily as needed for muscle spasms. 12/07/20   Marcello Fennel, PA-C  ?diclofenac  (VOLTAREN) 75 MG EC tablet Take 1 tablet (75 mg total) by mouth 3 (three) times daily as needed for mild pain or moderate pain. ?Patient taking differently: Take 75 mg by mouth daily. 02/04/19   Noemi Chapel, MD  ?diclofenac Sodium (VOLTAREN) 1 % GEL Apply 2 g topically 4 (four) times daily as needed. 09/07/20   Long, Wonda Olds, MD  ?ibuprofen (ADVIL) 600 MG tablet Take 1 tablet (600 mg total) by mouth every 6 (six) hours as needed. 09/01/20   Charlesetta Shanks, MD  ?ibuprofen (ADVIL,MOTRIN) 800 MG tablet Take 800 mg by mouth daily as needed for moderate pain.    [provider]  ?insulin glargine (LANTUS) 100 UNIT/ML injection Inject 10 Units into the skin at bedtime as needed (blood sugar over 280).  02/20/11 04/01/20  Fayrene Helper, MD  ?JANUVIA 100 MG tablet Take 100 mg by mouth daily. ?Patient not taking: Reported on 09/10/2020 05/06/15   [provider]  ?methocarbamol (ROBAXIN-750) 750 MG tablet Take 1 tablet (750 mg total) by mouth 4 (four) times daily. ?Patient not taking: Reported on 09/10/2020 09/01/20   Charlesetta Shanks, MD  ?Multiple Vitamin (MULTIVITAMIN WITH MINERALS) TABS tablet Take 1 tablet by mouth daily.    [provider]  ?oxyCODONE-acetaminophen (PERCOCET) 10-325 MG tablet Take 1 tablet by mouth every 6 (six) hours as needed for pain.  05/10/15  [provider]  ?phentermine 37.5 MG capsule Take 37.5 mg by mouth every morning.     [provider]  ?polyethylene glycol (MIRALAX / GLYCOLAX) packet Take 17 g by mouth daily as needed for mild constipation. ?Patient not taking: Reported on 09/10/2020    [provider]  ?pravastatin (PRAVACHOL) 80 MG tablet Take 80 mg by mouth every morning.  06/07/15   [provider]  ?vitamin B-12 (CYANOCOBALAMIN) 1000 MCG tablet Take 2,000 mcg by mouth daily.    [provider]  ?   ? ?Allergies    ?Liraglutide   ? ?Review of Systems   ?Review of Systems  ?All other systems reviewed and are  negative. ? ?Physical Exam ?Updated Vital Signs ?BP (!) 172/84   Pulse 90   Temp 97.6 ?F (36.4 ?C)   Resp 17   Ht '5\' 3"'$  (1.6 m)   Wt 69 kg   SpO2 100%   BMI 26.95 kg/m?  ?Physical Exam ?Vitals and nursing note reviewed.  ?66 year old female, resting comfortably and in no acute distress. Vital signs are significant for elevated blood pressure. Oxygen saturation is 100%, which is normal. ?Head is normocephalic and atraumatic. PERRLA, EOMI. Oropharynx is clear. ?Neck is nontender and supple without adenopathy or JVD. ?Back is mildly tender throughout the lumbar region without point tenderness. ?Lungs are clear without rales, wheezes, or rhonchi. ?Chest is nontender. ?Heart has regular rate and rhythm without murmur. ?Abdomen is soft, flat, nontender. ?Extremities: Left knee has mild swelling with a moderate effusion.  There is no instability on valgus or varus stress. ?Skin is warm and dry without rash. ?Neurologic: Mental status is normal, cranial nerves are intact, moves all extremities equally. ? ?ED Results / Procedures / Treatments   ?Labs ?(all labs ordered are listed, but only abnormal results are displayed) ?Labs Reviewed  ?URINALYSIS, ROUTINE W REFLEX MICROSCOPIC - Abnormal; Notable for the following components:  ?    Result Value  ? Leukocytes,Ua SMALL (*)   ? Bacteria, UA RARE (*)   ? All other components within normal limits  ? ?Procedures ?Procedures  ? ? ?Medications Ordered in ED ?Medications  ?ketorolac (TORADOL) injection 30 mg (has no administration in time range)  ?ondansetron (ZOFRAN-ODT) disintegrating tablet 8 mg (has no administration in time range)  ? ? ?ED Course/ Medical Decision Making/ A&P ?  ?                        ?Medical Decision Making ?Amount and/or Complexity of Data Reviewed ?Labs: ordered. ? ?Risk ?Prescription drug management. ? ? ?Urinary urgency, frequency suggestive of UTI.  Urinalysis has been ordered.  Left knee pain and swelling consistent with osteoarthritis.  Old  records are reviewed, and left knee x-ray and 2020 showed advanced osteoarthritis.  No fever or warmth to suggest septic arthritis.  Creatinine has been normal.  She does have a prior ED visit in February of this year for left knee pain which was treated with an injection of ketorolac.  She is given an injection of ketorolac today.  She does have a prior ED visit for urinary tract infection in 2017. ? ?Urinalysis shows no evidence of infection, also no glucose.'s to account for her urinary frequency.  Patient is advised of these findings, she is referred back to her primary care provider for follow-up.  Consider urology evaluation to see if she has a spastic bladder. ? ?Final Clinical Impression(s) / ED Diagnoses ?Final  diagnoses:  ?Urinary frequency  ?Elevated blood pressure reading with diagnosis of hypertension  ? ? ?Rx / DC Orders ?ED Discharge Orders   ? ? None  ? ?  ? ? ?  ?Delora Fuel, MD ?32/95/18 0231 ? ?

## 2021-10-09 ENCOUNTER — Emergency Department (HOSPITAL_COMMUNITY)
Admission: EM | Admit: 2021-10-09 | Discharge: 2021-10-09 | Disposition: A | Discharge status: Home / Self Care | Payer: Medicare Other | Attending: Emergency Medicine | Admitting: Emergency Medicine

## 2021-10-09 ENCOUNTER — Emergency Department (HOSPITAL_COMMUNITY): Payer: Medicare Other

## 2021-10-09 ENCOUNTER — Other Ambulatory Visit: Payer: Self-pay

## 2021-10-09 ENCOUNTER — Encounter (HOSPITAL_COMMUNITY): Payer: Self-pay

## 2021-10-09 DIAGNOSIS — Z794 Long term (current) use of insulin: Secondary | ICD-10-CM | POA: Insufficient documentation

## 2021-10-09 DIAGNOSIS — W010XXA Fall on same level from slipping, tripping and stumbling without subsequent striking against object, initial encounter: Secondary | ICD-10-CM | POA: Diagnosis not present

## 2021-10-09 DIAGNOSIS — M1712 Unilateral primary osteoarthritis, left knee: Secondary | ICD-10-CM | POA: Diagnosis not present

## 2021-10-09 DIAGNOSIS — M25562 Pain in left knee: Secondary | ICD-10-CM | POA: Diagnosis present

## 2021-10-09 DIAGNOSIS — M1991 Primary osteoarthritis, unspecified site: Secondary | ICD-10-CM

## 2021-10-09 DIAGNOSIS — Z7982 Long term (current) use of aspirin: Secondary | ICD-10-CM | POA: Diagnosis not present

## 2021-10-09 MED ORDER — KETOROLAC TROMETHAMINE 15 MG/ML IJ SOLN
15.0000 mg | Freq: Once | INTRAMUSCULAR | Status: AC
  Administered 2021-10-09: 15 mg via INTRAMUSCULAR
  Filled 2021-10-09: qty 1

## 2021-10-09 NOTE — Discharge Instructions (Addendum)
Please take your pain medication as prescribed.  I would like for you to follow-up with your primary care doctor or orthopedist for further evaluation.  Please return to the emergency department for any worsening symptoms you might have.

## 2021-10-09 NOTE — ED Triage Notes (Signed)
Pt presents to ED with left knee pain after slipping this am.

## 2021-12-23 ENCOUNTER — Encounter (HOSPITAL_COMMUNITY): Payer: Self-pay

## 2021-12-23 ENCOUNTER — Emergency Department (HOSPITAL_COMMUNITY): Payer: Medicare Other

## 2021-12-23 ENCOUNTER — Emergency Department (HOSPITAL_COMMUNITY)
Admission: EM | Admit: 2021-12-23 | Discharge: 2021-12-23 | Disposition: A | Discharge status: Home / Self Care | Payer: Medicare Other | Attending: Emergency Medicine | Admitting: Emergency Medicine

## 2021-12-23 ENCOUNTER — Other Ambulatory Visit: Payer: Self-pay

## 2021-12-23 DIAGNOSIS — M545 Low back pain, unspecified: Secondary | ICD-10-CM | POA: Insufficient documentation

## 2021-12-23 DIAGNOSIS — N3090 Cystitis, unspecified without hematuria: Secondary | ICD-10-CM | POA: Diagnosis not present

## 2021-12-23 DIAGNOSIS — M1712 Unilateral primary osteoarthritis, left knee: Secondary | ICD-10-CM | POA: Diagnosis not present

## 2021-12-23 DIAGNOSIS — Z7982 Long term (current) use of aspirin: Secondary | ICD-10-CM | POA: Diagnosis not present

## 2021-12-23 DIAGNOSIS — R350 Frequency of micturition: Secondary | ICD-10-CM | POA: Diagnosis present

## 2021-12-23 LAB — URINALYSIS, ROUTINE W REFLEX MICROSCOPIC
Bacteria, UA: NONE SEEN
Bilirubin Urine: NEGATIVE
Glucose, UA: NEGATIVE mg/dL
Hgb urine dipstick: NEGATIVE
Ketones, ur: NEGATIVE mg/dL
Nitrite: NEGATIVE
Protein, ur: NEGATIVE mg/dL
Specific Gravity, Urine: 1.012 (ref 1.005–1.030)
pH: 7 (ref 5.0–8.0)

## 2021-12-23 MED ORDER — KETOROLAC TROMETHAMINE 30 MG/ML IJ SOLN
30.0000 mg | Freq: Once | INTRAMUSCULAR | Status: AC
  Administered 2021-12-23: 30 mg via INTRAMUSCULAR
  Filled 2021-12-23: qty 1

## 2021-12-23 MED ORDER — CEPHALEXIN 500 MG PO CAPS: 500.0000 mg | ORAL_CAPSULE | Freq: Two times a day (BID) | ORAL | 0 refills | Status: DC

## 2021-12-23 NOTE — ED Provider Notes (Signed)
[facility] EMERGENCY DEPARTMENT Provider Note   CSN: [id number] Arrival date & time: 12/23/21  7829     History  Chief Complaint  Patient presents with   Urinary Tract Infection    Lisa Todd is a 66 y.o. female.  Patient presents to the emergency department with 2 separate complaints.  Patient reports that she has had pain in her lower back, urinary frequency and dysuria for several days.  This is similar to symptoms she has had with urinary tract infection in the past.  No fever, nausea, vomiting.  She is also complaining of knee pain.  Patient has a history of severe osteoarthritis of both of her knees.  Patient reports that her knees are "bone-on-bone".  She has been having increased pain in the left knee causing it hard to walk.  She reports that she has had injections of helped this pain in the past.       Home Medications Prior to Admission medications   Medication Sig Start Date End Date Taking? Authorizing Provider  albuterol (PROVENTIL HFA;VENTOLIN HFA) 108 (90 BASE) MCG/ACT inhaler Inhale 1-2 puffs into the lungs every 6 (six) hours as needed for wheezing. Patient taking differently: Inhale 2 puffs into the lungs every 6 (six) hours as needed for wheezing. 08/05/12   Prentiss Bells, MD  ALPRAZolam Duanne Moron) 1 MG tablet Take 1 mg by mouth 4 (four) times daily as needed for anxiety or sleep.  09/19/13   [provider]  aspirin EC 81 MG tablet Take 81 mg by mouth daily.    [provider]  calcium carbonate (OSCAL) 1500 (600 Ca) MG TABS tablet Take 600 mg of elemental calcium by mouth 2 (two) times daily with a meal.    [provider]  CARDIZEM LA 360 MG 24 hr tablet Take 360 mg by mouth daily.  05/06/15   [provider]  cyclobenzaprine (FLEXERIL) 10 MG tablet Take 1 tablet (10 mg total) by mouth 2 (two) times daily as needed for muscle spasms. 12/07/20   Marcello Fennel, PA-C  diclofenac (VOLTAREN) 75 MG EC tablet Take 1 tablet  (75 mg total) by mouth 3 (three) times daily as needed for mild pain or moderate pain. Patient taking differently: Take 75 mg by mouth daily. 02/04/19   Noemi Chapel, MD  diclofenac Sodium (VOLTAREN) 1 % GEL Apply 2 g topically 4 (four) times daily as needed. 09/07/20   Long, Wonda Olds, MD  ibuprofen (ADVIL) 600 MG tablet Take 1 tablet (600 mg total) by mouth every 6 (six) hours as needed. 09/01/20   Charlesetta Shanks, MD  ibuprofen (ADVIL,MOTRIN) 800 MG tablet Take 800 mg by mouth daily as needed for moderate pain.    [provider]  insulin glargine (LANTUS) 100 UNIT/ML injection Inject 10 Units into the skin at bedtime as needed (blood sugar over 280).  02/20/11 04/01/20  Fayrene Helper, MD  JANUVIA 100 MG tablet Take 100 mg by mouth daily. Patient not taking: Reported on 09/10/2020 05/06/15   [provider]  methocarbamol (ROBAXIN-750) 750 MG tablet Take 1 tablet (750 mg total) by mouth 4 (four) times daily. Patient not taking: Reported on 09/10/2020 09/01/20   Charlesetta Shanks, MD  Multiple Vitamin (MULTIVITAMIN WITH MINERALS) TABS tablet Take 1 tablet by mouth daily.    [provider]  oxyCODONE-acetaminophen (PERCOCET) 10-325 MG tablet Take 1 tablet by mouth every 6 (six) hours as needed for pain.  05/10/15   [provider]  phentermine 37.5 MG capsule Take 37.5 mg by mouth every morning.     [provider]  polyethylene glycol (MIRALAX / GLYCOLAX) packet Take 17 g by mouth daily as needed for mild constipation. Patient not taking: Reported on 09/10/2020    [provider]  pravastatin (PRAVACHOL) 80 MG tablet Take 80 mg by mouth every morning.  06/07/15   [provider]  vitamin B-12 (CYANOCOBALAMIN) 1000 MCG tablet Take 2,000 mcg by mouth daily.    [provider]      Allergies    Liraglutide    Review of Systems   Review of Systems  Physical Exam Updated Vital Signs BP 124/60   Pulse 71   Temp 98.3 F (36.8  C) (Oral)   Resp 20   Ht '5\' 3"'$  (1.6 m)   Wt 68 kg   SpO2 100%   BMI 26.56 kg/m  Physical Exam Vitals and nursing note reviewed.  Constitutional:      General: She is not in acute distress.    Appearance: She is well-developed.  HENT:     Head: Normocephalic and atraumatic.     Mouth/Throat:     Mouth: Mucous membranes are moist.  Eyes:     General: Vision grossly intact. Gaze aligned appropriately.     Extraocular Movements: Extraocular movements intact.     Conjunctiva/sclera: Conjunctivae normal.  Cardiovascular:     Rate and Rhythm: Normal rate and regular rhythm.     Pulses: Normal pulses.     Heart sounds: Normal heart sounds, S1 normal and S2 normal. No murmur heard.    No friction rub. No gallop.  Pulmonary:     Effort: Pulmonary effort is normal. No respiratory distress.     Breath sounds: Normal breath sounds.  Abdominal:     General: Bowel sounds are normal.     Palpations: Abdomen is soft.     Tenderness: There is no abdominal tenderness. There is no guarding or rebound.     Hernia: No hernia is present.  Musculoskeletal:        General: No swelling.     Cervical back: Full passive range of motion without pain, normal range of motion and neck supple. No spinous process tenderness or muscular tenderness. Normal range of motion.     Left knee: No swelling, deformity, effusion or erythema. Normal range of motion. Tenderness present.     Right lower leg: No edema.     Left lower leg: No edema.  Skin:    General: Skin is warm and dry.     Capillary Refill: Capillary refill takes less than 2 seconds.     Findings: No ecchymosis, erythema, rash or wound.  Neurological:     General: No focal deficit present.     Mental Status: She is alert and oriented to person, place, and time.     GCS: GCS eye subscore is 4. GCS verbal subscore is 5. GCS motor subscore is 6.     Cranial Nerves: Cranial nerves 2-12 are intact.     Sensory: Sensation is intact.     Motor: Motor  function is intact.     Coordination: Coordination is intact.  Psychiatric:        Attention and Perception: Attention normal.        Mood and Affect: Mood normal.        Speech: Speech normal.        Behavior: Behavior normal.     ED Results /  Procedures / Treatments   Labs (all labs ordered are listed, but only abnormal results are displayed) Labs Reviewed  URINALYSIS, ROUTINE W REFLEX MICROSCOPIC - Abnormal; Notable for the following components:      Result Value   Leukocytes,Ua MODERATE (*)    All other components within normal limits    EKG None  Radiology No results found.  Procedures Procedures    Medications Ordered in ED Medications - No data to display  ED Course/ Medical Decision Making/ A&P                           Medical Decision Making Amount and/or Complexity of Data Reviewed Labs: ordered.   Patient with symptoms that sound convincing for urinary tract infection but urinalysis does not show obvious infection.  Patient does report a history of infections in the past with similar symptoms, however, we will therefore empirically treat.  Patient does not appear ill, no signs of pyelonephritis.  Reviewing her prior records reveals that she has had relief with Toradol injections in the past.  No sign of renal failure.  Will give dose of Toradol.        Final Clinical Impression(s) / ED Diagnoses Final diagnoses:  Primary osteoarthritis of left knee  Cystitis    Rx / DC Orders ED Discharge Orders     None         Orpah Greek, MD 12/23/21 [phone]

## 2021-12-23 NOTE — ED Triage Notes (Signed)
Pov from home. Cc of uti symptoms and also wants a s hot in her left knee. States it is "bone on bone"  able to walk without assist.

## 2022-04-07 IMAGING — US US EXTREM LOW VENOUS
1 series · 13 of 24 positions shown · non-contrast
Comparison: None.

CLINICAL DATA: Bilateral peripheral edema



[Series 1: us venous img lower bilat (dvt) · portal-venous · 13 of 68 slices shown]
[im 1/68]
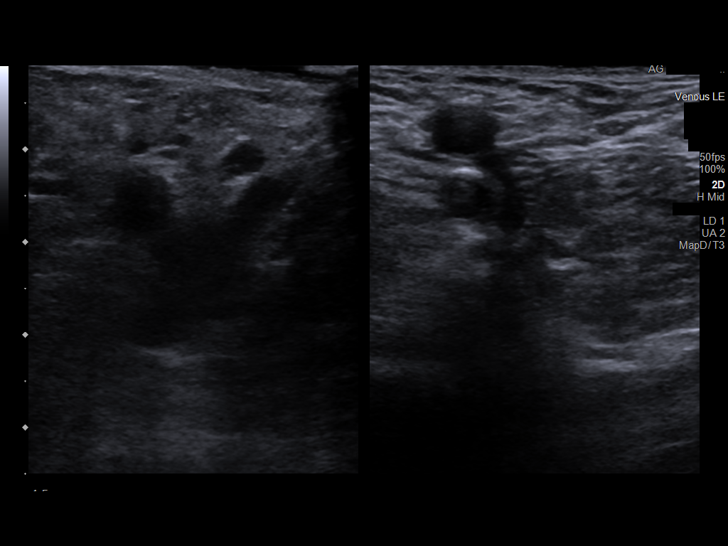
[im 6/68]
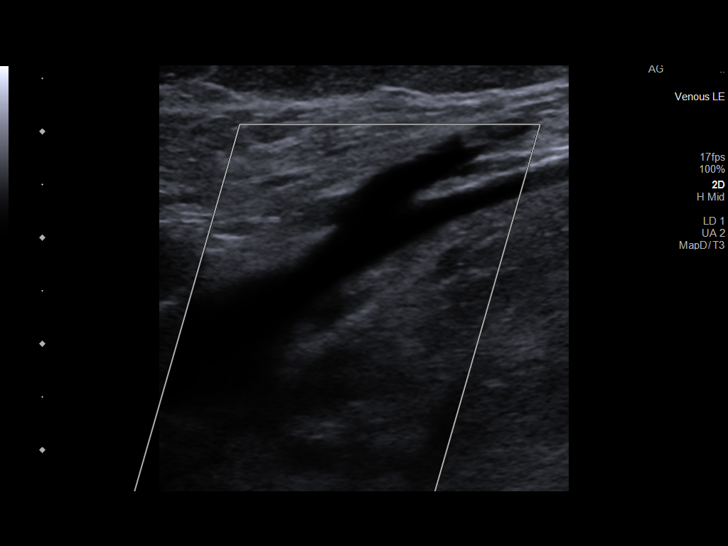
[im 12/68]
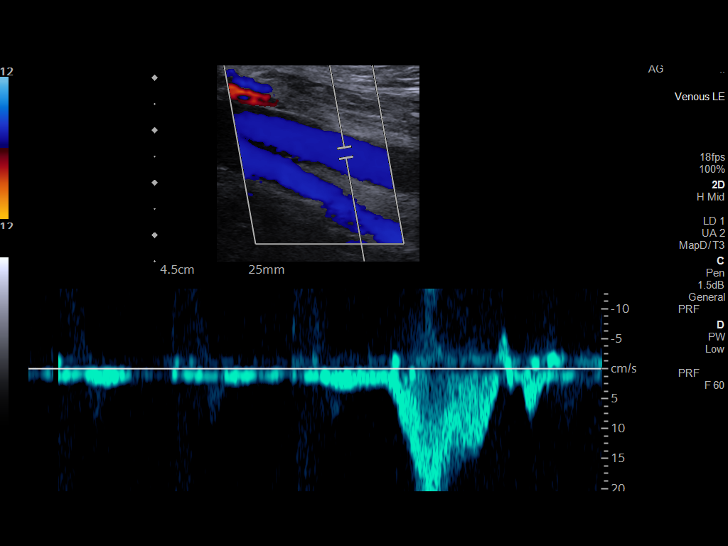
[im 18/68]
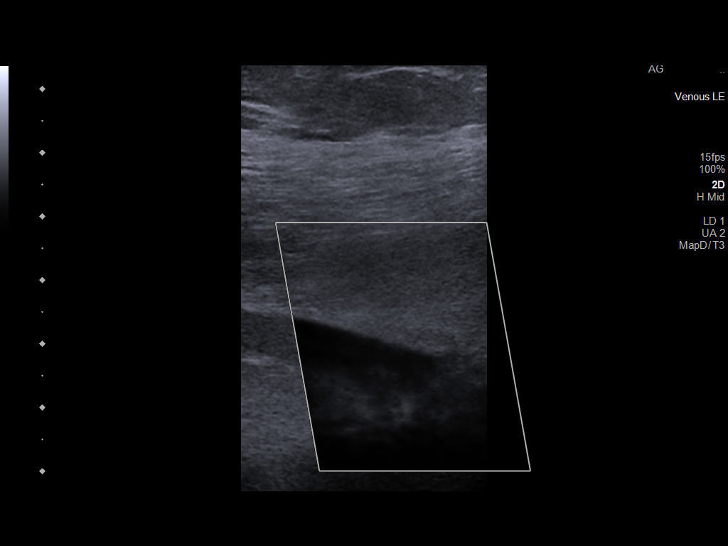
[im 24/68]
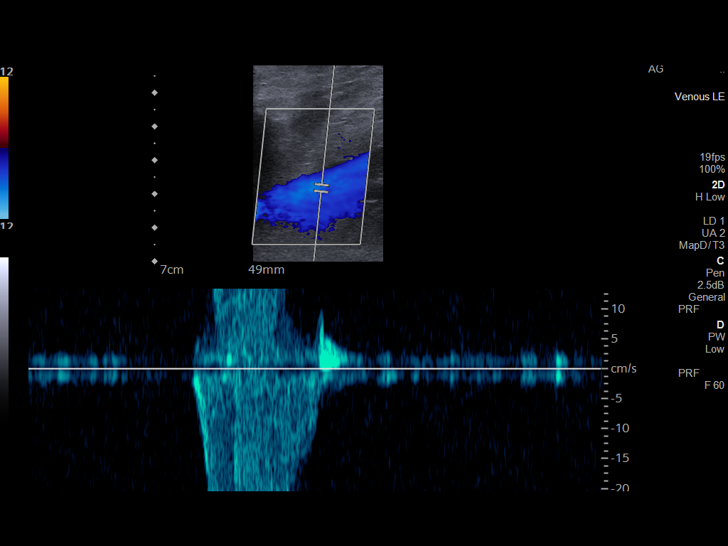
[im 30/68]
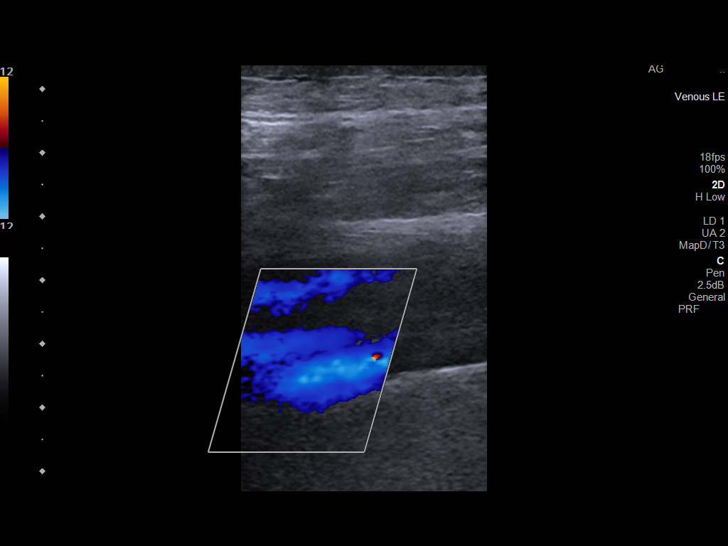
[im 35/68]
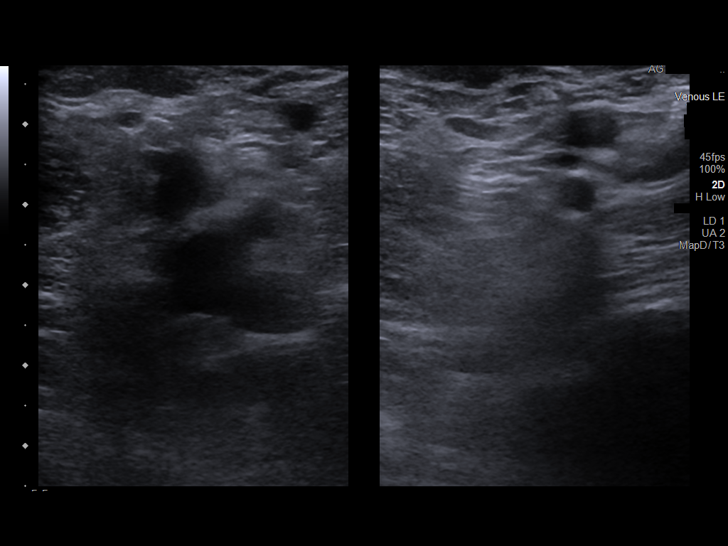
[im 38/68]
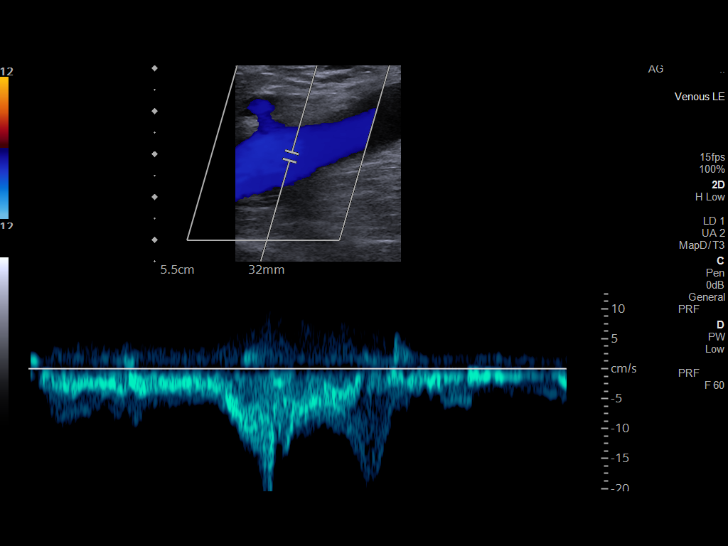
[im 44/68]
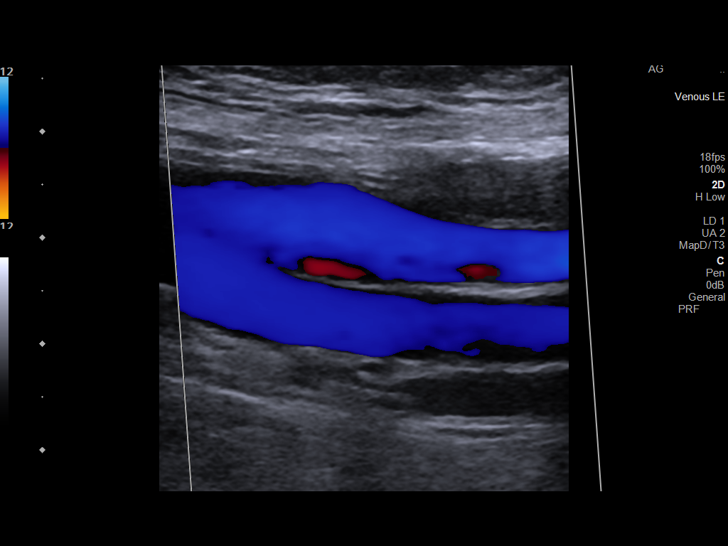
[im 50/68]
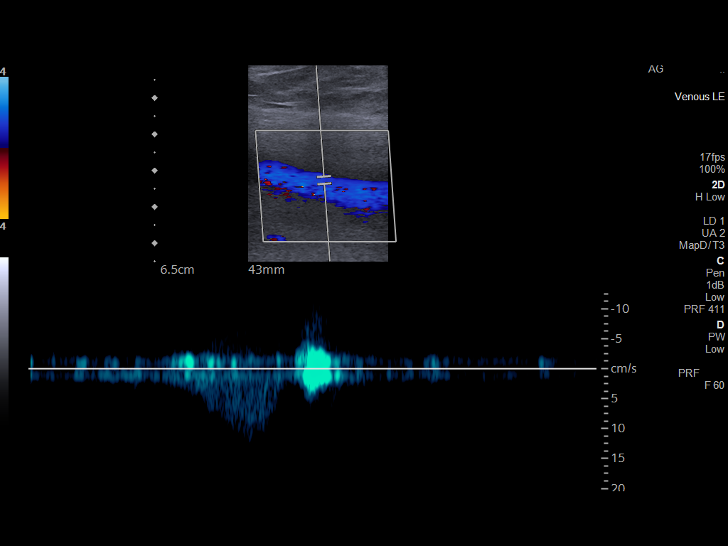
[im 56/68]
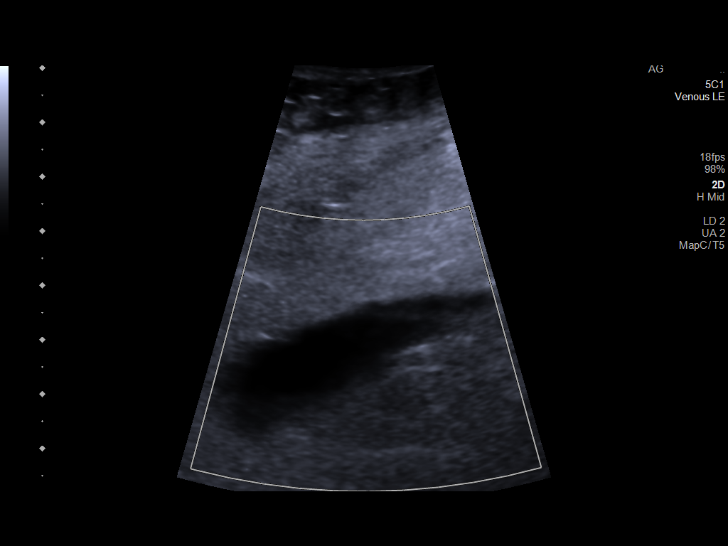
[im 62/68]
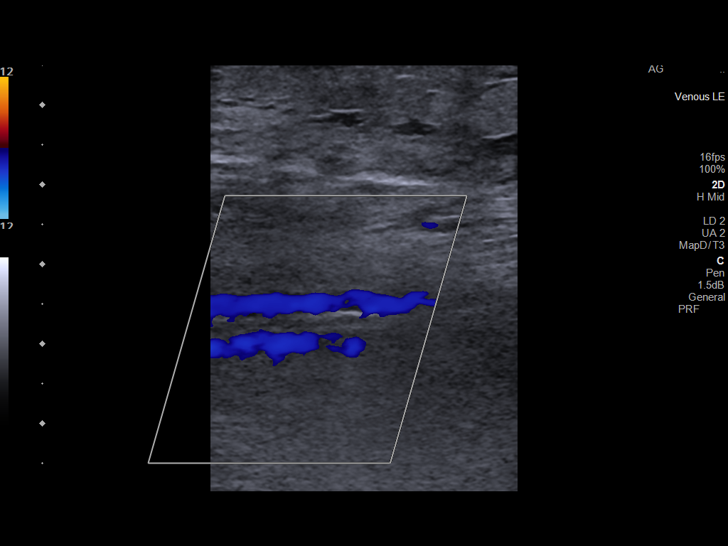
[im 68/68]
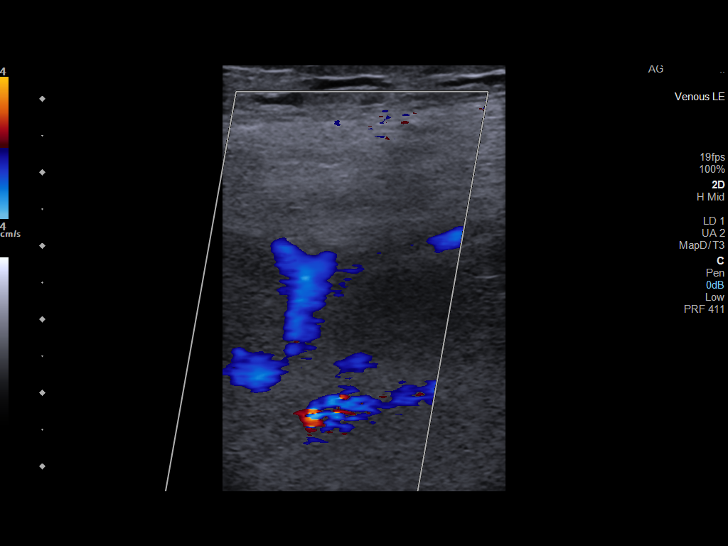

[13 of 24 positions shown; findings below may reference images not displayed]

FINDINGS: RIGHT LOWER EXTREMITY

Common Femoral Vein: No evidence of thrombus. Normal
compressibility, respiratory phasicity and response to augmentation.

Saphenofemoral Junction: No evidence of thrombus. Normal
compressibility and flow on color Doppler imaging.

Profunda Femoral Vein: No evidence of thrombus. Normal
compressibility and flow on color Doppler imaging.

Femoral Vein: No evidence of thrombus. Normal compressibility,
respiratory phasicity and response to augmentation.

Popliteal Vein: No evidence of thrombus. Normal compressibility,
respiratory phasicity and response to augmentation.

Calf Veins: No evidence of thrombus. Normal compressibility and flow
on color Doppler imaging.

Other Findings:  Peripheral edema noted.

LEFT LOWER EXTREMITY

Common Femoral Vein: No evidence of thrombus. Normal
compressibility, respiratory phasicity and response to augmentation.

Saphenofemoral Junction: No evidence of thrombus. Normal
compressibility and flow on color Doppler imaging.

Profunda Femoral Vein: No evidence of thrombus. Normal
compressibility and flow on color Doppler imaging.

Femoral Vein: No evidence of thrombus. Normal compressibility,
respiratory phasicity and response to augmentation.

Popliteal Vein: No evidence of thrombus. Normal compressibility,
respiratory phasicity and response to augmentation.

Calf Veins: No evidence of thrombus. Normal compressibility and flow
on color Doppler imaging.

Other Findings:  Peripheral edema noted.

Exam is limited because of obese body habitus.
IMPRESSION: Bilateral peripheral edema. Negative for significant DVT in either
lower extremity.

## 2022-05-16 ENCOUNTER — Other Ambulatory Visit: Payer: Self-pay

## 2022-05-16 ENCOUNTER — Emergency Department (HOSPITAL_COMMUNITY)
Admission: EM | Admit: 2022-05-16 | Discharge: 2022-05-16 | Disposition: A | Discharge status: Home / Self Care | Payer: 59 | Attending: Emergency Medicine | Admitting: Emergency Medicine

## 2022-05-16 ENCOUNTER — Encounter (HOSPITAL_COMMUNITY): Payer: Self-pay | Admitting: *Deleted

## 2022-05-16 DIAGNOSIS — E119 Type 2 diabetes mellitus without complications: Secondary | ICD-10-CM | POA: Diagnosis not present

## 2022-05-16 DIAGNOSIS — Z7982 Long term (current) use of aspirin: Secondary | ICD-10-CM | POA: Insufficient documentation

## 2022-05-16 DIAGNOSIS — R55 Syncope and collapse: Secondary | ICD-10-CM | POA: Insufficient documentation

## 2022-05-16 DIAGNOSIS — Z794 Long term (current) use of insulin: Secondary | ICD-10-CM | POA: Insufficient documentation

## 2022-05-16 DIAGNOSIS — R42 Dizziness and giddiness: Secondary | ICD-10-CM | POA: Diagnosis present

## 2022-05-16 LAB — COMPREHENSIVE METABOLIC PANEL
ALT: 31 U/L (ref 0–44)
AST: 27 U/L (ref 15–41)
Albumin: 4 g/dL (ref 3.5–5.0)
Alkaline Phosphatase: 55 U/L (ref 38–126)
Anion gap: 10 (ref 5–15)
BUN: 21 mg/dL (ref 8–23)
CO2: 23 mmol/L (ref 22–32)
Calcium: 8.7 mg/dL — ABNORMAL LOW (ref 8.9–10.3)
Chloride: 104 mmol/L (ref 98–111)
Creatinine, Ser: 0.71 mg/dL (ref 0.44–1.00)
GFR, Estimated: >60 mL/min (ref >60)
Glucose, Bld: 111 mg/dL — ABNORMAL HIGH (ref 70–99)
Potassium: 4.1 mmol/L (ref 3.5–5.1)
Sodium: 137 mmol/L (ref 135–145)
Total Bilirubin: 0.5 mg/dL (ref 0.3–1.2)
Total Protein: 7.2 g/dL (ref 6.5–8.1)

## 2022-05-16 LAB — CBC WITH DIFFERENTIAL/PLATELET
Abs Immature Granulocytes: 0.02 10*3/uL (ref 0.00–0.07)
Basophils Absolute: 0 10*3/uL (ref 0.0–0.1)
Basophils Relative: 1 %
Eosinophils Absolute: 0.1 10*3/uL (ref 0.0–0.5)
Eosinophils Relative: 1 %
HCT: 36.5 % (ref 36.0–46.0)
Hemoglobin: 11.8 g/dL — ABNORMAL LOW (ref 12.0–15.0)
Immature Granulocytes: 0 %
Lymphocytes Relative: 23 %
Lymphs Abs: 1.4 10*3/uL (ref 0.7–4.0)
MCH: 30.1 pg (ref 26.0–34.0)
MCHC: 32.3 g/dL (ref 30.0–36.0)
MCV: 93.1 fL (ref 80.0–100.0)
Monocytes Absolute: 0.5 10*3/uL (ref 0.1–1.0)
Monocytes Relative: 8 %
Neutro Abs: 4.2 10*3/uL (ref 1.7–7.7)
Neutrophils Relative %: 67 %
Platelets: 185 10*3/uL (ref 150–400)
RBC: 3.92 MIL/uL (ref 3.87–5.11)
RDW: 13.1 % (ref 11.5–15.5)
WBC: 6.1 10*3/uL (ref 4.0–10.5)
nRBC: 0 % (ref 0.0–0.2)

## 2022-05-16 LAB — URINALYSIS, ROUTINE W REFLEX MICROSCOPIC
Bilirubin Urine: NEGATIVE
Glucose, UA: NEGATIVE mg/dL
Hgb urine dipstick: NEGATIVE
Ketones, ur: NEGATIVE mg/dL
Leukocytes,Ua: NEGATIVE
Nitrite: NEGATIVE
Protein, ur: NEGATIVE mg/dL
Specific Gravity, Urine: 1.006 (ref 1.005–1.030)
pH: 7 (ref 5.0–8.0)

## 2022-05-16 LAB — TROPONIN I (HIGH SENSITIVITY): Troponin I (High Sensitivity): 5 ng/L (ref <18)

## 2022-05-16 MED ORDER — SODIUM CHLORIDE 0.9 % IV SOLN
INTRAVENOUS | Status: DC
Start: 2022-05-16 — End: 2022-05-16

## 2022-05-16 NOTE — ED Notes (Addendum)
Pt is resting/sleeping in the room at this time

## 2022-05-16 NOTE — ED Provider Notes (Signed)
[facility] Provider Note   CSN: [id number] Arrival date & time: 05/16/22  1157     History  Chief Complaint  Patient presents with   Loss of Consciousness    Lisa Todd is a 67 y.o. female.   Loss of Consciousness    This patient is a 67 year old female, she has a history of diabetes which is currently controlled with Januvia, she no longer takes insulin which she had taken in the past.  She also has a history of taking Cardizem, baby aspirin, no other anticoagulants.  She presents to the hospital after having a syncopal event this morning.  She remembers going into the kitchen, sitting down, she had been feeling lightheaded for couple of days but never had any chest pain or palpitations, no headache, no fevers or chills, no nausea or vomiting, no diarrhea or rectal bleeding, no urinary symptoms.  She was found by her family member to be laying on the floor of the kitchen after they heard her fall out of the chair.  She has no symptoms at this time except for feeling like her mouth is a little bit dry and feeling extra thirsty, she checks her blood sugar several times a day and it is usually around 100 which it was this morning as well.  This patient has had a gastric bypass and has lost a tremendous amount of weight over the last couple of years.  Home Medications Prior to Admission medications   Medication Sig Start Date End Date Taking? Authorizing Provider  albuterol (PROVENTIL HFA;VENTOLIN HFA) 108 (90 BASE) MCG/ACT inhaler Inhale 1-2 puffs into the lungs every 6 (six) hours as needed for wheezing. Patient taking differently: Inhale 2 puffs into the lungs every 6 (six) hours as needed for wheezing. 08/05/12   Prentiss Bells, MD  ALPRAZolam Duanne Moron) 1 MG tablet Take 1 mg by mouth 4 (four) times daily as needed for anxiety or sleep.  09/19/13   [provider]  aspirin EC 81 MG tablet Take 81 mg by mouth daily.    [provider]  calcium carbonate (OSCAL) 1500 (600 Ca) MG TABS tablet Take 600 mg of elemental calcium by mouth 2 (two) times daily with a meal.    [provider]  CARDIZEM LA 360 MG 24 hr tablet Take 360 mg by mouth daily.  05/06/15   [provider]  cephALEXin (KEFLEX) 500 MG capsule Take 1 capsule (500 mg total) by mouth 2 (two) times daily. 12/23/21   Orpah Greek, MD  cyclobenzaprine (FLEXERIL) 10 MG tablet Take 1 tablet (10 mg total) by mouth 2 (two) times daily as needed for muscle spasms. 12/07/20   Marcello Fennel, PA-C  diclofenac (VOLTAREN) 75 MG EC tablet Take 1 tablet (75 mg total) by mouth 3 (three) times daily as needed for mild pain or moderate pain. Patient taking differently: Take 75 mg by mouth daily. 02/04/19   Noemi Chapel, MD  diclofenac Sodium (VOLTAREN) 1 % GEL Apply 2 g topically 4 (four) times daily as needed. 09/07/20   Long, Wonda Olds, MD  ibuprofen (ADVIL) 600 MG tablet Take 1 tablet (600 mg total) by mouth every 6 (six) hours as needed. 09/01/20   Charlesetta Shanks, MD  ibuprofen (ADVIL,MOTRIN) 800 MG tablet Take 800 mg by mouth daily as needed for moderate pain.    [provider]  insulin glargine (LANTUS) 100 UNIT/ML injection Inject 10 Units into the skin at  bedtime as needed (blood sugar over 280).  02/20/11 04/01/20  Fayrene Helper, MD  JANUVIA 100 MG tablet Take 100 mg by mouth daily. Patient not taking: Reported on 09/10/2020 05/06/15   [provider]  methocarbamol (ROBAXIN-750) 750 MG tablet Take 1 tablet (750 mg total) by mouth 4 (four) times daily. Patient not taking: Reported on 09/10/2020 09/01/20   Charlesetta Shanks, MD  Multiple Vitamin (MULTIVITAMIN WITH MINERALS) TABS tablet Take 1 tablet by mouth daily.    [provider]  oxyCODONE-acetaminophen (PERCOCET) 10-325 MG tablet Take 1 tablet by mouth every 6 (six) hours as needed for pain.  05/10/15   [provider]  phentermine 37.5 MG capsule  Take 37.5 mg by mouth every morning.     [provider]  polyethylene glycol (MIRALAX / GLYCOLAX) packet Take 17 g by mouth daily as needed for mild constipation. Patient not taking: Reported on 09/10/2020    [provider]  pravastatin (PRAVACHOL) 80 MG tablet Take 80 mg by mouth every morning.  06/07/15   [provider]  vitamin B-12 (CYANOCOBALAMIN) 1000 MCG tablet Take 2,000 mcg by mouth daily.    [provider]      Allergies    Liraglutide    Review of Systems   Review of Systems  Cardiovascular:  Positive for syncope.  All other systems reviewed and are negative.   Physical Exam Updated Vital Signs BP 137/61   Pulse 61   Temp 97.6 F (36.4 C) (Oral)   Resp 12   Ht 1.6 m ('5\' 3"'$ )   Wt 68 kg   SpO2 100%   BMI 26.57 kg/m  Physical Exam Vitals and nursing note reviewed.  Constitutional:      General: She is not in acute distress.    Appearance: She is well-developed.  HENT:     Head: Normocephalic and atraumatic.     Mouth/Throat:     Pharynx: No oropharyngeal exudate.  Eyes:     General: No scleral icterus.       Right eye: No discharge.        Left eye: No discharge.     Conjunctiva/sclera: Conjunctivae normal.     Pupils: Pupils are equal, round, and reactive to light.  Neck:     Thyroid: No thyromegaly.     Vascular: No JVD.  Cardiovascular:     Rate and Rhythm: Normal rate and regular rhythm.     Heart sounds: Normal heart sounds. No murmur heard.    No friction rub. No gallop.  Pulmonary:     Effort: Pulmonary effort is normal. No respiratory distress.     Breath sounds: Normal breath sounds. No wheezing or rales.  Abdominal:     General: Bowel sounds are normal. There is no distension.     Palpations: Abdomen is soft. There is no mass.     Tenderness: There is no abdominal tenderness.  Musculoskeletal:        General: No tenderness. Normal range of motion.     Cervical back: Normal range of motion and neck  supple.     Right lower leg: No edema.     Left lower leg: No edema.  Lymphadenopathy:     Cervical: No cervical adenopathy.  Skin:    General: Skin is warm and dry.     Findings: No erythema or rash.  Neurological:     Mental Status: She is alert.     Coordination: Coordination normal.  Comments: Normal speech, normal coordination, normal strength in all 4 extremities, normal gait  Psychiatric:        Behavior: Behavior normal.     ED Results / Procedures / Treatments   Labs (all labs ordered are listed, but only abnormal results are displayed) Labs Reviewed  COMPREHENSIVE METABOLIC PANEL - Abnormal; Notable for the following components:      Result Value   Glucose, Bld 111 (*)    Calcium 8.7 (*)    All other components within normal limits  CBC WITH DIFFERENTIAL/PLATELET - Abnormal; Notable for the following components:   Hemoglobin 11.8 (*)    All other components within normal limits  URINALYSIS, ROUTINE W REFLEX MICROSCOPIC - Abnormal; Notable for the following components:   Color, Urine STRAW (*)    All other components within normal limits  TROPONIN I (HIGH SENSITIVITY)    EKG EKG Interpretation  Date/Time:  Saturday May 16 2022 08:16:40 EST Ventricular Rate:  87 PR Interval:  135 QRS Duration: 83 QT Interval:  388 QTC Calculation: 434 R Axis:   17 Text Interpretation: Sinus rhythm Ventricular premature complex Probable left atrial enlargement Confirmed by Noemi Chapel [id number]) on 05/16/2022 8:18:59 AM  Radiology No results found.  Procedures Procedures    Medications Ordered in ED Medications  0.9 %  sodium chloride infusion ( Intravenous New Bag/Given 05/16/22 2423)    ED Course/ Medical Decision Making/ A&P                             Medical Decision Making Amount and/or Complexity of Data Reviewed Labs: ordered. ECG/medicine tests: ordered.  Risk Prescription drug management.   This patient presents to the ED for concern of  syncope, this involves an extensive number of treatment options, and is a complaint that carries with it a high risk of complications and morbidity.  The differential diagnosis includes possible cardiac cause, dehydration, electrolyte abnormalities, she could also be anemic, she does have a pale appearance to her skin but her conjunctive appears normal, post gastric bypass she may have vitamin or electrolyte deficiencies.   Co morbidities that complicate the patient evaluation  Diabetes   Additional history obtained:  Additional history obtained from electronic medical record External records from outside source obtained and reviewed including multiple visits to the emergency department over the last couple of years for pain issues, urinary frequency, pleuritis but no recent admissions to the hospital since having her weight loss surgery and panniculectomy in 2018   Lab Tests:  I Ordered, and personally interpreted labs.  The pertinent results include: Unremarkable labs, CBC metabolic panel urinalysis all unremarkable   Cardiac Monitoring: / EKG:  The patient was maintained on a cardiac monitor.  I personally viewed and interpreted the cardiac monitored which showed an underlying rhythm of: Normal sinus rhythm    Problem List / ED Course / Critical interventions / Medication management  The patient was observed on a cardiac monitor for an extended period of time given IV fluids and was normotensive, heart rate was normal and had no further symptoms while she was in the emergency department.  Orthostatics were performed showing no signs of hypotension or tachycardia, she is feeling well and has no symptoms at this time, appears stable for discharge, doubt acute cardiac or pulmonary or neurologic cause.  This includes very low risk for pulmonary embolism given heart rate of 60 and an oxygen to 100%  Social Determinants of  Health:  none   Test / Admission - Considered:  Considered  admission but the patient has totally normal test vital signs and is asymptomatic at this time.         Final Clinical Impression(s) / ED Diagnoses Final diagnoses:  Syncope, unspecified syncope type     Noemi Chapel, MD 05/16/22 1049

## 2022-05-16 NOTE — ED Triage Notes (Signed)
Pt c/o syncopal episode this morning. Pt reports she has been feeling lightheaded intermittently over the last few days and this morning she felt lightheaded before she passed out. She got up to go to the bathroom and sat down in a chair and then her grandson who was at the house said he heard a "thump" sound and came to her and found her in the kitchen floor. Pt doesn't believe she could have struck her head on anything other than the floor because there was nothing else around her.

## 2022-05-16 NOTE — Discharge Instructions (Signed)
Your testing today has been very reassuring, I do want you to follow-up with your doctor within 2 to 3 days for recheck, make sure you are drinking plenty of clear liquids but return to the ER immediately for any severe worsening symptoms.

## 2022-09-10 ENCOUNTER — Encounter (HOSPITAL_COMMUNITY): Payer: Self-pay | Admitting: Emergency Medicine

## 2022-09-10 ENCOUNTER — Emergency Department (HOSPITAL_COMMUNITY)
Admission: EM | Admit: 2022-09-10 | Discharge: 2022-09-10 | Disposition: A | Discharge status: Home / Self Care | Payer: 59 | Attending: Emergency Medicine | Admitting: Emergency Medicine

## 2022-09-10 ENCOUNTER — Other Ambulatory Visit: Payer: Self-pay

## 2022-09-10 ENCOUNTER — Emergency Department (HOSPITAL_COMMUNITY): Payer: 59

## 2022-09-10 DIAGNOSIS — Z794 Long term (current) use of insulin: Secondary | ICD-10-CM | POA: Insufficient documentation

## 2022-09-10 DIAGNOSIS — Z7984 Long term (current) use of oral hypoglycemic drugs: Secondary | ICD-10-CM | POA: Diagnosis not present

## 2022-09-10 DIAGNOSIS — E119 Type 2 diabetes mellitus without complications: Secondary | ICD-10-CM | POA: Diagnosis not present

## 2022-09-10 DIAGNOSIS — Z7982 Long term (current) use of aspirin: Secondary | ICD-10-CM | POA: Insufficient documentation

## 2022-09-10 DIAGNOSIS — M79672 Pain in left foot: Secondary | ICD-10-CM | POA: Insufficient documentation

## 2022-09-10 NOTE — ED Provider Notes (Signed)
[place] EMERGENCY DEPARTMENT AT [facility] Provider Note   CSN: [id number] Arrival date & time: 09/10/22  1521     History Chief Complaint  Patient presents with   Foot Pain    Lisa Todd is a 67 y.o. female.  Patient with past history significant for type 2 diabetes presents emergency department complaints of left foot pain.  She reports that she noticed a ball on the base of the left foot that she felt was larger prior to arriving to the emergency department has now shrunk in size.  No obvious signs of any puncture injury to the area.  Patient is a type 2 diabetes and is concerned of possible infection to the site.  Has not previously been evaluated by any podiatrist for any diabetic foot ulcers, diabetic wound care.   Foot Pain       Home Medications Prior to Admission medications   Medication Sig Start Date End Date Taking? Authorizing Provider  albuterol (PROVENTIL HFA;VENTOLIN HFA) 108 (90 BASE) MCG/ACT inhaler Inhale 1-2 puffs into the lungs every 6 (six) hours as needed for wheezing. Patient taking differently: Inhale 2 puffs into the lungs every 6 (six) hours as needed for wheezing. 08/05/12   Annamarie Dawley, MD  ALPRAZolam Prudy Feeler) 1 MG tablet Take 1 mg by mouth 4 (four) times daily as needed for anxiety or sleep.  09/19/13   [provider]  aspirin EC 81 MG tablet Take 81 mg by mouth daily.    [provider]  calcium carbonate (OSCAL) 1500 (600 Ca) MG TABS tablet Take 600 mg of elemental calcium by mouth 2 (two) times daily with a meal.    [provider]  CARDIZEM LA 360 MG 24 hr tablet Take 360 mg by mouth daily.  05/06/15   [provider]  cephALEXin (KEFLEX) 500 MG capsule Take 1 capsule (500 mg total) by mouth 2 (two) times daily. 12/23/21   Gilda Crease, MD  cyclobenzaprine (FLEXERIL) 10 MG tablet Take 1 tablet (10 mg total) by mouth 2 (two) times daily as needed for muscle spasms. 12/07/20   Carroll Sage, PA-C  diclofenac (VOLTAREN) 75 MG EC tablet Take 1 tablet (75 mg total) by mouth 3 (three) times daily as needed for mild pain or moderate pain. Patient taking differently: Take 75 mg by mouth daily. 02/04/19   Eber Hong, MD  diclofenac Sodium (VOLTAREN) 1 % GEL Apply 2 g topically 4 (four) times daily as needed. 09/07/20   Long, Arlyss Repress, MD  ibuprofen (ADVIL) 600 MG tablet Take 1 tablet (600 mg total) by mouth every 6 (six) hours as needed. 09/01/20   Arby Barrette, MD  ibuprofen (ADVIL,MOTRIN) 800 MG tablet Take 800 mg by mouth daily as needed for moderate pain.    [provider]  insulin glargine (LANTUS) 100 UNIT/ML injection Inject 10 Units into the skin at bedtime as needed (blood sugar over 280).  02/20/11 04/01/20  Kerri Perches, MD  JANUVIA 100 MG tablet Take 100 mg by mouth daily. Patient not taking: Reported on 09/10/2020 05/06/15   [provider]  methocarbamol (ROBAXIN-750) 750 MG tablet Take 1 tablet (750 mg total) by mouth 4 (four) times daily. Patient not taking: Reported on 09/10/2020 09/01/20   Arby Barrette, MD  Multiple Vitamin (MULTIVITAMIN WITH MINERALS) TABS tablet Take 1 tablet by mouth daily.    [provider]  oxyCODONE-acetaminophen (PERCOCET) 10-325 MG tablet Take 1 tablet by mouth every 6 (  six) hours as needed for pain.  05/10/15   [provider]  phentermine 37.5 MG capsule Take 37.5 mg by mouth every morning.     [provider]  polyethylene glycol (MIRALAX / GLYCOLAX) packet Take 17 g by mouth daily as needed for mild constipation. Patient not taking: Reported on 09/10/2020    [provider]  pravastatin (PRAVACHOL) 80 MG tablet Take 80 mg by mouth every morning.  06/07/15   [provider]  vitamin B-12 (CYANOCOBALAMIN) 1000 MCG tablet Take 2,000 mcg by mouth daily.    [provider]      Allergies    Liraglutide    Review of Systems   Review of Systems   Musculoskeletal:        Foot pain  All other systems reviewed and are negative.   Physical Exam Updated Vital Signs BP (!) 161/81 (BP Location: Right Arm)   Pulse 83   Temp 98.8 F (37.1 C) (Oral)   Resp 18   Ht 5\' 3"  (1.6 m)   Wt 66.2 kg   SpO2 100%   BMI 25.86 kg/m  Physical Exam Vitals and nursing note reviewed.  Constitutional:      General: She is not in acute distress.    Appearance: She is well-developed.  HENT:     Head: Normocephalic and atraumatic.  Eyes:     Conjunctiva/sclera: Conjunctivae normal.  Cardiovascular:     Rate and Rhythm: Normal rate and regular rhythm.  Pulmonary:     Effort: Pulmonary effort is normal. No respiratory distress.     Breath sounds: Normal breath sounds.  Musculoskeletal:        General: Tenderness and deformity present. No swelling. Normal range of motion.     Cervical back: Neck supple.     Comments: 1 cm ball noted to the base of the left foot which is firm and mobile.  Unable to appreciate any obvious fluctuance such as an abscess present.  The left foot itself is not swollen or erythematous yielding low concern for infection at this time.  Skin:    General: Skin is warm and dry.     Capillary Refill: Capillary refill takes less than 2 seconds.  Neurological:     Mental Status: She is alert.  Psychiatric:        Mood and Affect: Mood normal.     ED Results / Procedures / Treatments   Labs (all labs ordered are listed, but only abnormal results are displayed) Labs Reviewed - No data to display  EKG None  Radiology DG Foot Complete Left  Result Date: 09/10/2022 CLINICAL DATA:  Left foot pain.  No known injury. EXAM: LEFT FOOT - COMPLETE 3+ VIEW COMPARISON:  06/19/2017 FINDINGS: The joint spaces are maintained. Mild degenerative changes. Remote proximal phalanx fractures are healed. No acute fracture is identified. Moderate mid and hindfoot degenerative changes along with moderate tibiotalar joint degenerative changes.  Dorsal spurring off the talus. Sizable calcaneal heel spurs. Advanced small vessel disease suggesting diabetes. IMPRESSION: 1. No acute bony findings. 2. Moderate mid and hindfoot degenerative changes. 3. Sizable calcaneal heel spurs. 4. Advanced small vessel disease suggesting diabetes. Electronically Signed   By: Rudie Meyer M.D.   On: 09/10/2022 16:13    Procedures Procedures   Medications Ordered in ED Medications - No data to display  ED Course/ Medical Decision Making/ A&P  Medical Decision Making Amount and/or Complexity of Data Reviewed Radiology: ordered.   This patient presents to the ED for concern of foot pain.  Differential diagnosis includes DVT, plantar fasciitis, ingrown toenail, diabetic foot ulcer  Imaging Studies ordered:  I ordered imaging studies including x-ray of left foot I independently visualized and interpreted imaging which showed no obvious acute findings I agree with the radiologist interpretation   Problem List / ED Course:  Patient presents emergency department complaints of foot pain.  She reports has been experiencing foot pain for the last day or so has noticed a lump/bump under the arch of her left foot.  Describes pain with ambulation.  Denies any prior history of any blood clots.  Not currently on blood thinners.  Does note history of type 2 diabetes but not currently being seen by any foot specialist for evaluation of any foot ulcers or foot injuries.  On examination, patient's foot appears unremarkable without any signs of any lacerations, erythema, or other concerning findings for wounds.  There is a small firm bump noted to the plantar aspect of the left foot which is mobile.  No acute concerns at this time for an abscess at the site so do not believe that patient would benefit from incision and drainage of the site.  Given presentation and history of type 2 diabetes, I advised patient that she would benefit from  outpatient podiatry evaluation.  A referral was sent to Dr. Logan Bores with podiatry.  Patient agreeable to treatment plan verbalized understanding all return precautions.  All questions answered prior to patient discharge.   Final Clinical Impression(s) / ED Diagnoses Final diagnoses:  Foot pain, left  Type 2 diabetes mellitus without complication, without long-term current use of insulin [facility])    Rx / DC Orders ED Discharge Orders          Ordered    Ambulatory referral to Podiatry        09/10/22 1723              Smitty Knudsen, PA-C 09/11/22 1719    Derwood Kaplan, MD 09/12/22 1513

## 2022-09-10 NOTE — ED Triage Notes (Signed)
Pt via POV c/o left foot pain since earlier today, stating it felt like there was a big lump under the arch of her foot. Hx diabetes. No known injury.

## 2022-09-10 NOTE — Discharge Instructions (Signed)
You are seen in the emergency department for left foot pain.  Based on your extremity, there does not appear to be an acute reason for the pain that you are experiencing.  There does appear to be a small bump on the base of the left foot.  I have referred you to a podiatrist for further evaluation and management of foot health given her history of type 2 diabetes.  If you have any acute worsening of her symptoms please return to the emergency department for further evaluation.  Otherwise she can manage her symptoms with over-the-counter pain medication such as Tylenol, ibuprofen, Aleve.

## 2022-09-10 NOTE — ED Notes (Signed)
Patient transported to X-ray 

## 2022-12-15 ENCOUNTER — Other Ambulatory Visit: Payer: Self-pay

## 2022-12-15 ENCOUNTER — Encounter (HOSPITAL_COMMUNITY): Payer: Self-pay | Admitting: Emergency Medicine

## 2022-12-15 ENCOUNTER — Emergency Department (HOSPITAL_COMMUNITY)
Admission: EM | Admit: 2022-12-15 | Discharge: 2022-12-15 | Disposition: A | Discharge status: Home / Self Care | Payer: 59 | Attending: Emergency Medicine | Admitting: Emergency Medicine

## 2022-12-15 DIAGNOSIS — Z7982 Long term (current) use of aspirin: Secondary | ICD-10-CM | POA: Insufficient documentation

## 2022-12-15 DIAGNOSIS — K4090 Unilateral inguinal hernia, without obstruction or gangrene, not specified as recurrent: Secondary | ICD-10-CM | POA: Insufficient documentation

## 2022-12-15 DIAGNOSIS — M7989 Other specified soft tissue disorders: Secondary | ICD-10-CM | POA: Diagnosis present

## 2022-12-15 DIAGNOSIS — R600 Localized edema: Secondary | ICD-10-CM | POA: Diagnosis not present

## 2022-12-15 LAB — COMPREHENSIVE METABOLIC PANEL
ALT: 21 U/L (ref 0–44)
AST: 21 U/L (ref 15–41)
Albumin: 3.8 g/dL (ref 3.5–5.0)
Alkaline Phosphatase: 52 U/L (ref 38–126)
Anion gap: 8 (ref 5–15)
BUN: 20 mg/dL (ref 8–23)
CO2: 25 mmol/L (ref 22–32)
Calcium: 8.6 mg/dL — ABNORMAL LOW (ref 8.9–10.3)
Chloride: 100 mmol/L (ref 98–111)
Creatinine, Ser: 1.01 mg/dL — ABNORMAL HIGH (ref 0.44–1.00)
GFR, Estimated: >60 mL/min (ref >60)
Glucose, Bld: 102 mg/dL — ABNORMAL HIGH (ref 70–99)
Potassium: 4 mmol/L (ref 3.5–5.1)
Sodium: 133 mmol/L — ABNORMAL LOW (ref 135–145)
Total Bilirubin: 0.5 mg/dL (ref 0.3–1.2)
Total Protein: 7 g/dL (ref 6.5–8.1)

## 2022-12-15 LAB — CBC WITH DIFFERENTIAL/PLATELET
Abs Immature Granulocytes: 0.01 10*3/uL (ref 0.00–0.07)
Basophils Absolute: 0 10*3/uL (ref 0.0–0.1)
Basophils Relative: 0 %
Eosinophils Absolute: 0.1 10*3/uL (ref 0.0–0.5)
Eosinophils Relative: 2 %
HCT: 35.9 % — ABNORMAL LOW (ref 36.0–46.0)
Hemoglobin: 11.5 g/dL — ABNORMAL LOW (ref 12.0–15.0)
Immature Granulocytes: 0 %
Lymphocytes Relative: 25 %
Lymphs Abs: 1.3 10*3/uL (ref 0.7–4.0)
MCH: 30.2 pg (ref 26.0–34.0)
MCHC: 32 g/dL (ref 30.0–36.0)
MCV: 94.2 fL (ref 80.0–100.0)
Monocytes Absolute: 0.6 10*3/uL (ref 0.1–1.0)
Monocytes Relative: 10 %
Neutro Abs: 3.3 10*3/uL (ref 1.7–7.7)
Neutrophils Relative %: 63 %
Platelets: 166 10*3/uL (ref 150–400)
RBC: 3.81 MIL/uL — ABNORMAL LOW (ref 3.87–5.11)
RDW: 12.7 % (ref 11.5–15.5)
WBC: 5.3 10*3/uL (ref 4.0–10.5)
nRBC: 0 % (ref 0.0–0.2)

## 2022-12-15 LAB — BRAIN NATRIURETIC PEPTIDE: B Natriuretic Peptide: 12 pg/mL (ref 0.0–100.0)

## 2022-12-15 MED ORDER — MORPHINE SULFATE (PF) 4 MG/ML IV SOLN
6.0000 mg | Freq: Once | INTRAVENOUS | Status: AC
  Administered 2022-12-15: 6 mg via INTRAMUSCULAR
  Filled 2022-12-15: qty 2

## 2022-12-15 NOTE — ED Notes (Signed)
Patient using the bathroom, instructed to provide urine sample in case one is ordered.

## 2022-12-15 NOTE — ED Triage Notes (Signed)
Pt complains of bilateral leg swelling x 3 days. Pt is not on fluid medication. Pt also complains of knot on right groin x 4 days.

## 2022-12-15 NOTE — ED Provider Notes (Signed)
[place] EMERGENCY DEPARTMENT AT [facility] Provider Note   CSN: [id number] Arrival date & time: 12/15/22  1854     History  Chief Complaint  Patient presents with   Leg Swelling    Lisa Todd is a 67 y.o. female.  Knot on lower stomach for the last week. No changes in bowels. No changes in intake. No severe pain. Worse when she stands up. Also has swollen legs for a couple days. Elevation doesn't help. L>R. Worse after work. Pain 2/2 swelling. Lasix at home seemed to help.  Patient initially says she never had this before however in review of the records it appears in 2022 she had similar symptoms.  She states she tries to use compression stockings as well but they do not fit because her legs are so big.  She is mainly here because the pain is bad.  Is gotten little bit better over the last couple days.  Left does not usually greater than the right.  Has not had any fevers.        Home Medications Prior to Admission medications   Medication Sig Start Date End Date Taking? Authorizing Provider  albuterol (PROVENTIL HFA;VENTOLIN HFA) 108 (90 BASE) MCG/ACT inhaler Inhale 1-2 puffs into the lungs every 6 (six) hours as needed for wheezing. Patient taking differently: Inhale 2 puffs into the lungs every 6 (six) hours as needed for wheezing. 08/05/12   Annamarie Dawley, MD  ALPRAZolam Prudy Feeler) 1 MG tablet Take 1 mg by mouth 4 (four) times daily as needed for anxiety or sleep.  09/19/13   [provider]  aspirin EC 81 MG tablet Take 81 mg by mouth daily.    [provider]  calcium carbonate (OSCAL) 1500 (600 Ca) MG TABS tablet Take 600 mg of elemental calcium by mouth 2 (two) times daily with a meal.    [provider]  CARDIZEM LA 360 MG 24 hr tablet Take 360 mg by mouth daily.  05/06/15   [provider]  cephALEXin (KEFLEX) 500 MG capsule Take 1 capsule (500 mg total) by mouth 2 (two) times daily. 12/23/21   Gilda Crease, MD   cyclobenzaprine (FLEXERIL) 10 MG tablet Take 1 tablet (10 mg total) by mouth 2 (two) times daily as needed for muscle spasms. 12/07/20   Carroll Sage, PA-C  diclofenac (VOLTAREN) 75 MG EC tablet Take 1 tablet (75 mg total) by mouth 3 (three) times daily as needed for mild pain or moderate pain. Patient taking differently: Take 75 mg by mouth daily. 02/04/19   Eber Hong, MD  diclofenac Sodium (VOLTAREN) 1 % GEL Apply 2 g topically 4 (four) times daily as needed. 09/07/20   Long, Arlyss Repress, MD  ibuprofen (ADVIL) 600 MG tablet Take 1 tablet (600 mg total) by mouth every 6 (six) hours as needed. 09/01/20   Arby Barrette, MD  ibuprofen (ADVIL,MOTRIN) 800 MG tablet Take 800 mg by mouth daily as needed for moderate pain.    [provider]  insulin glargine (LANTUS) 100 UNIT/ML injection Inject 10 Units into the skin at bedtime as needed (blood sugar over 280).  02/20/11 04/01/20  Kerri Perches, MD  JANUVIA 100 MG tablet Take 100 mg by mouth daily. Patient not taking: Reported on 09/10/2020 05/06/15   [provider]  methocarbamol (ROBAXIN-750) 750 MG tablet Take 1 tablet (750 mg total) by mouth 4 (four) times daily. Patient not taking: Reported on 09/10/2020 09/01/20   Arby Barrette,  MD  Multiple Vitamin (MULTIVITAMIN WITH MINERALS) TABS tablet Take 1 tablet by mouth daily.    [provider]  oxyCODONE-acetaminophen (PERCOCET) 10-325 MG tablet Take 1 tablet by mouth every 6 (six) hours as needed for pain.  05/10/15   [provider]  phentermine 37.5 MG capsule Take 37.5 mg by mouth every morning.     [provider]  polyethylene glycol (MIRALAX / GLYCOLAX) packet Take 17 g by mouth daily as needed for mild constipation. Patient not taking: Reported on 09/10/2020    [provider]  pravastatin (PRAVACHOL) 80 MG tablet Take 80 mg by mouth every morning.  06/07/15   [provider]  vitamin B-12 (CYANOCOBALAMIN) 1000 MCG tablet  Take 2,000 mcg by mouth daily.    [provider]      Allergies    Liraglutide    Review of Systems   Review of Systems  Physical Exam Updated Vital Signs BP 133/63 (BP Location: Left Arm)   Pulse 69   Temp 98 F (36.7 C) (Oral)   Resp 17   Ht 5\' 3"  (1.6 m)   Wt 64.4 kg   SpO2 100%   BMI 25.15 kg/m  Physical Exam Vitals and nursing note reviewed.  Constitutional:      Appearance: She is well-developed.  HENT:     Head: Normocephalic and atraumatic.  Cardiovascular:     Rate and Rhythm: Normal rate and regular rhythm.  Pulmonary:     Effort: No respiratory distress.     Breath sounds: No stridor.  Abdominal:     General: There is no distension.     Comments: Right inguinal hernia when standing, bettery with lying. No erythema. No pain. Easily reducible.   Genitourinary:    Comments: Chaperoned by nurse tech. No abnormalities when lying flat but has a small easily reducible right sided inguinal hernia when standing.  Musculoskeletal:     Cervical back: Normal range of motion.     Right lower leg: Edema present.     Left lower leg: Edema (L>R edema) present.  Skin:    Comments: No erythema. No induration.   Neurological:     Mental Status: She is alert.     ED Results / Procedures / Treatments   Labs (all labs ordered are listed, but only abnormal results are displayed) Labs Reviewed  CBC WITH DIFFERENTIAL/PLATELET - Abnormal; Notable for the following components:      Result Value   RBC 3.81 (*)    Hemoglobin 11.5 (*)    HCT 35.9 (*)    All other components within normal limits  COMPREHENSIVE METABOLIC PANEL - Abnormal; Notable for the following components:   Sodium 133 (*)    Glucose, Bld 102 (*)    Creatinine, Ser 1.01 (*)    Calcium 8.6 (*)    All other components within normal limits  BRAIN NATRIURETIC PEPTIDE    EKG None  Radiology No results found.  Procedures Procedures    Medications Ordered in ED Medications  morphine  (PF) 4 MG/ML injection 6 mg (has no administration in time range)    ED Course/ Medical Decision Making/ A&P   \                             Medical Decision Making Amount and/or Complexity of Data Reviewed Labs: ordered.  Risk Prescription drug management.   67 year old female here with 2 symptoms.  The  first 1 is she has a right inguinal hernia.  No evidence of obstruction, incarceration.  No indication for imaging.  She will follow-up with surgery and her primary doctor for the same.  On the second symptom of lower extremity edema she has had this before and has Lasix at home which seems to be helping.  Kidney function is little bit abnormal but not to the point where should be causing edema.  Her left is bigger than the right.  No evidence of cellulitis.  Will get a venous ultrasound tomorrow and continue the Lasix if that is normal.  Will also need to follow-up with her doctor for further management of this.  Final Clinical Impression(s) / ED Diagnoses Final diagnoses:  Peripheral edema  Non-recurrent unilateral inguinal hernia without obstruction or gangrene    Rx / DC Orders ED Discharge Orders          Ordered    US Venous Img Lower Bilateral        12/15/22 2332              Annaston Upham, Barbara Cower, MD 12/15/22 2346

## 2022-12-15 NOTE — ED Notes (Signed)
ED Provider at bedside. 

## 2022-12-17 ENCOUNTER — Ambulatory Visit (HOSPITAL_COMMUNITY)
Admission: RE | Admit: 2022-12-17 | Discharge: 2022-12-17 | Disposition: A | Discharge status: Home / Self Care | Payer: 59 | Source: Ambulatory Visit | Attending: Emergency Medicine | Admitting: Emergency Medicine

## 2022-12-17 DIAGNOSIS — R600 Localized edema: Secondary | ICD-10-CM | POA: Insufficient documentation

## 2023-05-25 ENCOUNTER — Encounter (HOSPITAL_COMMUNITY): Payer: Self-pay

## 2023-05-25 ENCOUNTER — Other Ambulatory Visit: Payer: Self-pay

## 2023-05-25 ENCOUNTER — Emergency Department (HOSPITAL_COMMUNITY)
Admission: EM | Admit: 2023-05-25 | Discharge: 2023-05-25 | Discharge status: Against Medical Advice | Payer: 59 | Attending: Emergency Medicine | Admitting: Emergency Medicine

## 2023-05-25 DIAGNOSIS — R1084 Generalized abdominal pain: Secondary | ICD-10-CM | POA: Diagnosis not present

## 2023-05-25 DIAGNOSIS — R1110 Vomiting, unspecified: Secondary | ICD-10-CM | POA: Insufficient documentation

## 2023-05-25 DIAGNOSIS — Z5321 Procedure and treatment not carried out due to patient leaving prior to being seen by health care provider: Secondary | ICD-10-CM | POA: Diagnosis not present

## 2023-05-25 LAB — CBC
HCT: 40.2 % (ref 36.0–46.0)
Hemoglobin: 13.1 g/dL (ref 12.0–15.0)
MCH: 30.8 pg (ref 26.0–34.0)
MCHC: 32.6 g/dL (ref 30.0–36.0)
MCV: 94.4 fL (ref 80.0–100.0)
Platelets: 143 10*3/uL — ABNORMAL LOW (ref 150–400)
RBC: 4.26 MIL/uL (ref 3.87–5.11)
RDW: 12.7 % (ref 11.5–15.5)
WBC: 5.8 10*3/uL (ref 4.0–10.5)
nRBC: 0 % (ref 0.0–0.2)

## 2023-05-25 LAB — URINALYSIS, ROUTINE W REFLEX MICROSCOPIC
Bilirubin Urine: NEGATIVE
Glucose, UA: NEGATIVE mg/dL
Hgb urine dipstick: NEGATIVE
Ketones, ur: NEGATIVE mg/dL
Leukocytes,Ua: NEGATIVE
Nitrite: NEGATIVE
Protein, ur: NEGATIVE mg/dL
Specific Gravity, Urine: 1.006 (ref 1.005–1.030)
pH: 7 (ref 5.0–8.0)

## 2023-05-25 LAB — COMPREHENSIVE METABOLIC PANEL
ALT: 26 U/L (ref 0–44)
AST: 22 U/L (ref 15–41)
Albumin: 3.9 g/dL (ref 3.5–5.0)
Alkaline Phosphatase: 50 U/L (ref 38–126)
Anion gap: 9 (ref 5–15)
BUN: 10 mg/dL (ref 8–23)
CO2: 26 mmol/L (ref 22–32)
Calcium: 9.1 mg/dL (ref 8.9–10.3)
Chloride: 99 mmol/L (ref 98–111)
Creatinine, Ser: 0.54 mg/dL (ref 0.44–1.00)
GFR, Estimated: >60 mL/min (ref >60)
Glucose, Bld: 115 mg/dL — ABNORMAL HIGH (ref 70–99)
Potassium: 3.8 mmol/L (ref 3.5–5.1)
Sodium: 134 mmol/L — ABNORMAL LOW (ref 135–145)
Total Bilirubin: 0.8 mg/dL (ref 0.0–1.2)
Total Protein: 7 g/dL (ref 6.5–8.1)

## 2023-05-25 LAB — LIPASE, BLOOD: Lipase: 27 U/L (ref 11–51)

## 2023-05-25 NOTE — ED Notes (Signed)
 Called for Pt from waiting room X 3. No Answer.

## 2023-05-25 NOTE — ED Triage Notes (Signed)
Pt presents to ED with complaints of vomiting started yesterday with generalized abdominal pain.

## 2023-07-20 ENCOUNTER — Telehealth: Admitting: Physician Assistant

## 2023-07-20 DIAGNOSIS — R3989 Other symptoms and signs involving the genitourinary system: Secondary | ICD-10-CM

## 2023-07-20 MED ORDER — CEPHALEXIN 500 MG PO CAPS: 500.0000 mg | ORAL_CAPSULE | Freq: Two times a day (BID) | ORAL | 0 refills | Status: DC

## 2023-07-20 NOTE — Progress Notes (Signed)
 I have spent 5 minutes in review of e-visit questionnaire, review and updating patient chart, medical decision making and response to patient.   Piedad Climes, PA-C

## 2023-07-20 NOTE — Progress Notes (Signed)

## 2023-07-27 ENCOUNTER — Encounter: Payer: Self-pay | Admitting: Emergency Medicine

## 2023-07-27 ENCOUNTER — Ambulatory Visit
Admission: EM | Admit: 2023-07-27 | Discharge: 2023-07-27 | Disposition: A | Discharge status: Home / Self Care | Attending: Nurse Practitioner | Admitting: Nurse Practitioner

## 2023-07-27 DIAGNOSIS — R300 Dysuria: Secondary | ICD-10-CM

## 2023-07-27 DIAGNOSIS — N898 Other specified noninflammatory disorders of vagina: Secondary | ICD-10-CM

## 2023-07-27 LAB — POCT URINALYSIS DIP (MANUAL ENTRY)
Bilirubin, UA: NEGATIVE
Blood, UA: NEGATIVE
Glucose, UA: NEGATIVE mg/dL
Ketones, POC UA: NEGATIVE mg/dL
Nitrite, UA: NEGATIVE
Protein Ur, POC: NEGATIVE mg/dL
Spec Grav, UA: 1.015
Urobilinogen, UA: 1 U/dL
pH, UA: 6

## 2023-07-27 MED ORDER — FLUCONAZOLE 150 MG PO TABS: 150.0000 mg | ORAL_TABLET | Freq: Once | ORAL | 0 refills | Status: AC
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
## 2023-07-27 NOTE — Discharge Instructions (Addendum)
 We are treating you for a yeast infection today.  Take the fluconazole pill to treat it.  We will contact you if the urine culture shows we need to treat with another antibiotic.

## 2023-07-27 NOTE — ED Triage Notes (Signed)
 Frequent urination and lower back pain x 1.5 weeks.  States had a mychart visit and was given an antibiotic and finished that on Monday.  States symptoms have not improved.

## 2023-07-28 ENCOUNTER — Other Ambulatory Visit: Payer: Self-pay

## 2023-07-28 ENCOUNTER — Ambulatory Visit
Admission: EM | Admit: 2023-07-28 | Discharge: 2023-07-28 | Disposition: A | Discharge status: Home / Self Care | Attending: Nurse Practitioner | Admitting: Nurse Practitioner

## 2023-07-28 DIAGNOSIS — F439 Reaction to severe stress, unspecified: Secondary | ICD-10-CM

## 2023-07-28 DIAGNOSIS — R42 Dizziness and giddiness: Secondary | ICD-10-CM

## 2023-07-28 LAB — POCT FASTING CBG KUC MANUAL ENTRY: POCT Glucose (KUC): 144 mg/dL — AB (ref 70–99)

## 2023-07-28 NOTE — Discharge Instructions (Signed)
 EKG, blood sugar, and all vital signs look great today.  Your exam also looks great.  Please follow-up with your doctor if symptoms do not improve with rest, hydration, and using stress management techniques.

## 2023-07-28 NOTE — ED Triage Notes (Signed)
 Pt reports dizziness and elevated BP, states she was seen yesterday for elevated BP.
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
## 2023-08-24 ENCOUNTER — Emergency Department (HOSPITAL_COMMUNITY)

## 2023-08-24 ENCOUNTER — Encounter (HOSPITAL_COMMUNITY): Payer: Self-pay | Admitting: Radiology

## 2023-08-24 ENCOUNTER — Emergency Department (HOSPITAL_COMMUNITY)
Admission: EM | Admit: 2023-08-24 | Discharge: 2023-08-24 | Disposition: A | Discharge status: Home / Self Care | Attending: Emergency Medicine | Admitting: Emergency Medicine

## 2023-08-24 ENCOUNTER — Other Ambulatory Visit: Payer: Self-pay

## 2023-08-24 DIAGNOSIS — Z79899 Other long term (current) drug therapy: Secondary | ICD-10-CM | POA: Diagnosis not present

## 2023-08-24 DIAGNOSIS — R531 Weakness: Secondary | ICD-10-CM | POA: Insufficient documentation

## 2023-08-24 DIAGNOSIS — K4090 Unilateral inguinal hernia, without obstruction or gangrene, not specified as recurrent: Secondary | ICD-10-CM | POA: Insufficient documentation

## 2023-08-24 DIAGNOSIS — Z794 Long term (current) use of insulin: Secondary | ICD-10-CM | POA: Insufficient documentation

## 2023-08-24 DIAGNOSIS — Z7982 Long term (current) use of aspirin: Secondary | ICD-10-CM | POA: Insufficient documentation

## 2023-08-24 DIAGNOSIS — R55 Syncope and collapse: Secondary | ICD-10-CM | POA: Insufficient documentation

## 2023-08-24 LAB — CBC WITH DIFFERENTIAL/PLATELET
Abs Immature Granulocytes: 0.03 10*3/uL (ref 0.00–0.07)
Basophils Absolute: 0 10*3/uL (ref 0.0–0.1)
Basophils Relative: 0 %
Eosinophils Absolute: 0 10*3/uL (ref 0.0–0.5)
Eosinophils Relative: 0 %
HCT: 35.7 % — ABNORMAL LOW (ref 36.0–46.0)
Hemoglobin: 11.6 g/dL — ABNORMAL LOW (ref 12.0–15.0)
Immature Granulocytes: 0 %
Lymphocytes Relative: 11 %
Lymphs Abs: 0.8 10*3/uL (ref 0.7–4.0)
MCH: 30.7 pg (ref 26.0–34.0)
MCHC: 32.5 g/dL (ref 30.0–36.0)
MCV: 94.4 fL (ref 80.0–100.0)
Monocytes Absolute: 0.4 10*3/uL (ref 0.1–1.0)
Monocytes Relative: 6 %
Neutro Abs: 5.7 10*3/uL (ref 1.7–7.7)
Neutrophils Relative %: 83 %
Platelets: 166 10*3/uL (ref 150–400)
RBC: 3.78 MIL/uL — ABNORMAL LOW (ref 3.87–5.11)
RDW: 12.6 % (ref 11.5–15.5)
WBC: 7 10*3/uL (ref 4.0–10.5)
nRBC: 0 % (ref 0.0–0.2)

## 2023-08-24 LAB — COMPREHENSIVE METABOLIC PANEL WITH GFR
ALT: 34 U/L (ref 0–44)
AST: 23 U/L (ref 15–41)
Albumin: 3.8 g/dL (ref 3.5–5.0)
Alkaline Phosphatase: 55 U/L (ref 38–126)
Anion gap: 9 (ref 5–15)
BUN: 27 mg/dL — ABNORMAL HIGH (ref 8–23)
CO2: 21 mmol/L — ABNORMAL LOW (ref 22–32)
Calcium: 8.8 mg/dL — ABNORMAL LOW (ref 8.9–10.3)
Chloride: 103 mmol/L (ref 98–111)
Creatinine, Ser: 0.93 mg/dL (ref 0.44–1.00)
GFR, Estimated: >60 mL/min (ref >60)
Glucose, Bld: 157 mg/dL — ABNORMAL HIGH (ref 70–99)
Potassium: 4.4 mmol/L (ref 3.5–5.1)
Sodium: 133 mmol/L — ABNORMAL LOW (ref 135–145)
Total Bilirubin: 0.5 mg/dL (ref 0.0–1.2)
Total Protein: 6.8 g/dL (ref 6.5–8.1)

## 2023-08-24 LAB — TROPONIN I (HIGH SENSITIVITY)
Troponin I (High Sensitivity): 5 ng/L (ref <18)
Troponin I (High Sensitivity): 6 ng/L (ref <18)

## 2023-08-24 LAB — CBG MONITORING, ED: Glucose-Capillary: 149 mg/dL — ABNORMAL HIGH (ref 70–99)

## 2023-08-24 LAB — RAPID URINE DRUG SCREEN, HOSP PERFORMED
Amphetamines: NOT DETECTED
Barbiturates: NOT DETECTED
Benzodiazepines: NOT DETECTED
Cocaine: NOT DETECTED
Opiates: NOT DETECTED
Tetrahydrocannabinol: NOT DETECTED

## 2023-08-24 LAB — D-DIMER, QUANTITATIVE: D-Dimer, Quant: 0.58 ug{FEU}/mL — ABNORMAL HIGH (ref 0.00–0.50)

## 2023-08-24 LAB — ETHANOL: Alcohol, Ethyl (B): <15 mg/dL (ref <15)

## 2023-08-24 MED ORDER — SODIUM CHLORIDE 0.9 % IV BOLUS
1000.0000 mL | Freq: Once | INTRAVENOUS | Status: AC
  Administered 2023-08-24 (×2): 1000 mL via INTRAVENOUS

## 2023-08-24 MED ORDER — IOHEXOL 300 MG/ML  SOLN
100.0000 mL | Freq: Once | INTRAMUSCULAR | Status: AC | PRN
  Administered 2023-08-24: 100 mL via INTRAVENOUS

## 2023-08-24 MED ORDER — ONDANSETRON HCL 4 MG/2ML IJ SOLN
4.0000 mg | Freq: Once | INTRAMUSCULAR | Status: AC
  Administered 2023-08-24: 4 mg via INTRAVENOUS
  Filled 2023-08-24: qty 2

## 2023-08-24 NOTE — ED Triage Notes (Addendum)
 Patient BIB [facility] from work, for complaint of feeling weak, and was sat down in a wheel chair and brought out to the sidewalk. Patient witnessed passing out, EMS witnessed in rout to ED no changes in vitals remained stable. Patient denise pain, states that vision is blurry.

## 2023-08-24 NOTE — ED Provider Notes (Signed)
 [place] [facility]MENT AT [facility] Provider Note   CSN: [id number] Arrival date & time: 08/24/23  1225     History  Chief Complaint  Patient presents with   Near Syncope   Weakness    Lisa Todd is a 68 y.o. female.  She has history of gastric bypass surgery, hypertension, asthma   Near Syncope  Weakness Associated symptoms: near-syncope        Home Medications Prior to Admission medications   Medication Sig Start Date End Date Taking? Authorizing Provider  albuterol  (PROVENTIL  HFA;VENTOLIN  HFA) 108 (90 BASE) MCG/ACT inhaler Inhale 1-2 puffs into the lungs every 6 (six) hours as needed for wheezing. Patient taking differently: Inhale 2 puffs into the lungs every 6 (six) hours as needed for wheezing. 08/05/12   Kennieth Peaches, MD  ALPRAZolam  (XANAX ) 1 MG tablet Take 1 mg by mouth 4 (four) times daily as needed for anxiety or sleep.  09/19/13   [provider]  aspirin EC 81 MG tablet Take 81 mg by mouth daily.    [provider]  calcium carbonate (OSCAL) 1500 (600 Ca) MG TABS tablet Take 600 mg of elemental calcium by mouth 2 (two) times daily with a meal.    [provider]  CARDIZEM  LA 360 MG 24 hr tablet Take 360 mg by mouth daily.  05/06/15   [provider]  celecoxib  (CELEBREX ) 200 MG capsule Take 200 mg by mouth 2 (two) times daily. 05/13/23   [provider]  cyclobenzaprine  (FLEXERIL ) 10 MG tablet Take 1 tablet (10 mg total) by mouth 2 (two) times daily as needed for muscle spasms. 12/07/20   Volney Grumbles, PA-C  diclofenac  (VOLTAREN ) 75 MG EC tablet Take 1 tablet (75 mg total) by mouth 3 (three) times daily as needed for mild pain or moderate pain. Patient taking differently: Take 75 mg by mouth daily. 02/04/19   Early Glisson, MD  diclofenac  Sodium (VOLTAREN ) 1 % GEL Apply 2 g topically 4 (four) times daily as needed. 09/07/20   Long, Shereen Dike, MD  fluconazole  (DIFLUCAN ) 150 MG tablet Take 150  mg by mouth once a week. 07/31/23   [provider]  ibuprofen  (ADVIL ) 600 MG tablet Take 1 tablet (600 mg total) by mouth every 6 (six) hours as needed. 09/01/20   Wynetta Heckle, MD  ibuprofen  (ADVIL ,MOTRIN ) 800 MG tablet Take 800 mg by mouth daily as needed for moderate pain.    [provider]  insulin  glargine (LANTUS ) 100 UNIT/ML injection Inject 10 Units into the skin at bedtime as needed (blood sugar over 280).  02/20/11 04/01/20  Towanda Fret, MD  JANUVIA 100 MG tablet Take 100 mg by mouth daily. Patient not taking: Reported on 09/10/2020 05/06/15   [provider]  lisinopril-hydrochlorothiazide (ZESTORETIC) 10-12.5 MG tablet Take 1 tablet by mouth daily. 07/31/23   [provider]  methocarbamol  (ROBAXIN -750) 750 MG tablet Take 1 tablet (750 mg total) by mouth 4 (four) times daily. Patient not taking: Reported on 09/10/2020 09/01/20   Wynetta Heckle, MD  MOUNJARO 10 MG/0.5ML Pen Inject 10 mg into the skin once a week. 07/19/23   [provider]  Multiple Vitamin (MULTIVITAMIN WITH MINERALS) TABS tablet Take 1 tablet by mouth daily.    [provider]  oxyCODONE -acetaminophen  (PERCOCET) 10-325 MG tablet Take 1 tablet by mouth every 6 (six) hours as needed for pain.  05/10/15   [provider]  phentermine  37.5 MG capsule Take 37.5  mg by mouth every morning.     [provider]  polyethylene glycol (MIRALAX  / GLYCOLAX ) packet Take 17 g by mouth daily as needed for mild constipation. Patient not taking: Reported on 09/10/2020    [provider]  pravastatin  (PRAVACHOL ) 80 MG tablet Take 80 mg by mouth every morning.  06/07/15   [provider]  vitamin B-12 (CYANOCOBALAMIN) 1000 MCG tablet Take 2,000 mcg by mouth daily.    [provider]      Allergies    Liraglutide    Review of Systems   Review of Systems  Cardiovascular:  Positive for near-syncope.  Neurological:  Positive for  weakness.    Physical Exam Updated Vital Signs BP (!) 139/59   Pulse (!) 57   Temp 97.8 F (36.6 C) (Oral)   Resp 11   Ht 5\' 3"  (1.6 m)   Wt 6.804 kg   SpO2 100%   BMI 2.66 kg/m  Physical Exam Vitals and nursing note reviewed.  Constitutional:      General: She is not in acute distress.    Appearance: She is well-developed.  HENT:     Head: Normocephalic and atraumatic.     Mouth/Throat:     Mouth: Mucous membranes are moist.  Eyes:     Conjunctiva/sclera: Conjunctivae normal.  Cardiovascular:     Rate and Rhythm: Normal rate and regular rhythm.     Heart sounds: No murmur heard. Pulmonary:     Effort: Pulmonary effort is normal. No respiratory distress.     Breath sounds: Normal breath sounds.  Abdominal:     Palpations: Abdomen is soft.     Tenderness: There is no abdominal tenderness.  Musculoskeletal:        General: No swelling.     Cervical back: Neck supple.  Skin:    General: Skin is warm and dry.     Capillary Refill: Capillary refill takes less than 2 seconds.  Neurological:     General: No focal deficit present.     Mental Status: She is alert and oriented to person, place, and time.  Psychiatric:        Mood and Affect: Mood normal.     ED Results / Procedures / Treatments   Labs (all labs ordered are listed, but only abnormal results are displayed) Labs Reviewed  CBG MONITORING, ED - Abnormal; Notable for the following components:      Result Value   Glucose-Capillary 149 (*)    All other components within normal limits  RAPID URINE DRUG SCREEN, HOSP PERFORMED  CBC WITH DIFFERENTIAL/PLATELET  COMPREHENSIVE METABOLIC PANEL WITH GFR  ETHANOL    EKG EKG Interpretation Date/Time:  Tuesday Aug 24 2023 13:00:01 EDT Ventricular Rate:  63 PR Interval:  148 QRS Duration:  92 QT Interval:  403 QTC Calculation: 413 R Axis:   172  Text Interpretation: Sinus rhythm Right axis deviation Confirmed by Eldon Greenland ([id number]) on 08/24/2023 1:37:23  PM  Radiology DG Chest 2 View Result Date: 08/24/2023 CLINICAL DATA:  Syncope. EXAM: CHEST - 2 VIEW COMPARISON:  Sep 07, 2020. FINDINGS: The heart size and mediastinal contours are within normal limits. Both lungs are clear. The visualized skeletal structures are unremarkable. IMPRESSION: No active cardiopulmonary disease. Electronically Signed   By: Rosalene Colon M.D.   On: 08/24/2023 13:55    Procedures Procedures    Medications Ordered in ED Medications - No data to display  ED Course/ Medical Decision Making/ A&P  Medical Decision Making This patient presents to the ED for concern of syncope in the setting of nausea vomiting and diarrhea, this involves an extensive number of treatment options, and is a complaint that carries with it a high risk of complications and morbidity.  The differential diagnosis includes orthostatic hypotension, arrhythmia, vasovagal syncope, seizure, bowel obstruction, gastritis, gastroenteritis, foodborne illness, other   Co morbidities that complicate the patient evaluation :   HTN, asthma   Additional history obtained:  Additional history obtained from EMR External records from outside source obtained and reviewed including notes and labs   Lab Tests:  I Ordered, and personally interpreted labs.  The pertinent results include:    CBC-no significant anemia or leukocytosis CMP-mild hyponatremia, CO2 slightly decreased at 21 consistent with vomiting and diarrhea, BUN at 127 likely due to dehydration from her vomiting and diarrhea Troponin negative x 2 D-dimer-negative for age adjustment   Imaging Studies ordered:  I ordered imaging studies including CT abdomen and pelvis which shows right inguinal hernia with bowel loops contained within hernia, causing proximal small bowel obstruction I independently visualized and interpreted imaging within scope of identifying emergent findings  I agree with the radiologist  interpretation  Chest x-ray shows no pulmonary edema or infiltrate; CT head shows no intracranial hemorrhage, no stroke, no mass Cardiac Monitoring: / EKG:  The patient was maintained on a cardiac monitor.  I personally viewed and interpreted the cardiac monitored which showed an underlying rhythm of: Sinus rhythm, no arrhythmia noted on cardiac monitoring in the ED   Consultations Obtained:  I requested consultation with the neurosurgeon Dr. Larrie Po,  and discussed lab and imaging findings as well as pertinent plan - they recommend: Outpatient follow-up after ED reduction of hernia for elective repair   Problem List / ED Course / Critical interventions / Medication management  Syncope-occurred after forceful vomiting and diarrhea, patient felt lightheaded.  Likely due to dehydration and possibly vasovagal syncope from the forceful vomiting. In the ER patient had recurrence of abdominal pain and cramping, CT ordered and shows hernia with small bowel loop causing small bowel obstruction.  I was able to reduce this without significant difficulty, patient has not had recurrence of pain she has some mild local tenderness but no crepitus.  She is feeling better here in the ER.  I consulted surgery as above he will follow-up with her outpatient.  We discussed no heavy lifting.  Avoid straining for bowel movements.  Will give work note.  Advised on follow-up and strict return precautions. I ordered medication including Zofran  for nausea, Reevaluation of the patient after these medicines showed that the patient improved I have reviewed the patients home medicines and have made adjustments as needed   Social Determinants of Health:  Lives alone-due to syncope her daughter is going to stay with her tonight   Test / Admission - Considered:  Considered admission but hernia was able to be reduced, patient is feeling better and wants to go home.    Amount and/or Complexity of Data Reviewed Labs:  ordered. Radiology: ordered.  Risk Prescription drug management.           Final Clinical Impression(s) / ED Diagnoses Final diagnoses:  None    Rx / DC Orders ED Discharge Orders     None         Joshua Nieves 08/24/23 1917    Eldon Greenland, MD 08/26/23 [phone]

## 2023-08-24 NOTE — Discharge Instructions (Addendum)
 Pleasure taking care of you today.  You are seen for an episode of passing out after vomiting abdominal pain.  Your workup for your heart was reassuring, CT scan of your head was normal.  You are having returned pain in your abdomen so we got a CT scan which showed that you have a hernia.  This is causing a bowel obstruction.  I was able to reduce the hernia relieving the obstruction.  If you have recurrence of pain or vomiting or have the bulging area that will not go back in when you push it in come back to the ER right away.  Otherwise follow-up closely with Dr. Larrie Po, the general surgeon.

## 2023-08-24 NOTE — ED Notes (Signed)
 Patient asked if she has a pacemaker, none present.

## 2023-08-24 NOTE — ED Notes (Signed)
 Patient transported to CT

## 2023-08-24 NOTE — ED Notes (Signed)
 Patient assisted to restroom, assisted with multiple layers of shape wear to be removed and gown put on. While patient stood up to wash hands patient began to sway to right side assisted patient back to wheelchair and to room.

## 2023-08-24 NOTE — ED Notes (Signed)
 ED Provider at bedside.

## 2023-08-26 ENCOUNTER — Encounter (HOSPITAL_COMMUNITY): Payer: Self-pay | Admitting: Emergency Medicine

## 2023-08-26 ENCOUNTER — Emergency Department (HOSPITAL_COMMUNITY)

## 2023-08-26 ENCOUNTER — Other Ambulatory Visit: Payer: Self-pay

## 2023-08-26 ENCOUNTER — Emergency Department (HOSPITAL_COMMUNITY)
Admission: EM | Admit: 2023-08-26 | Discharge: 2023-08-26 | Disposition: A | Discharge status: Home / Self Care | Attending: Emergency Medicine | Admitting: Emergency Medicine

## 2023-08-26 DIAGNOSIS — R112 Nausea with vomiting, unspecified: Secondary | ICD-10-CM | POA: Diagnosis present

## 2023-08-26 DIAGNOSIS — R059 Cough, unspecified: Secondary | ICD-10-CM | POA: Insufficient documentation

## 2023-08-26 DIAGNOSIS — T80818A Extravasation of other vesicant agent, initial encounter: Secondary | ICD-10-CM | POA: Diagnosis not present

## 2023-08-26 DIAGNOSIS — Z7982 Long term (current) use of aspirin: Secondary | ICD-10-CM | POA: Diagnosis not present

## 2023-08-26 DIAGNOSIS — K4090 Unilateral inguinal hernia, without obstruction or gangrene, not specified as recurrent: Secondary | ICD-10-CM | POA: Diagnosis not present

## 2023-08-26 LAB — COMPREHENSIVE METABOLIC PANEL WITH GFR
ALT: 24 U/L (ref 0–44)
AST: 24 U/L (ref 15–41)
Albumin: 3.4 g/dL — ABNORMAL LOW (ref 3.5–5.0)
Alkaline Phosphatase: 51 U/L (ref 38–126)
Anion gap: 6 (ref 5–15)
BUN: 23 mg/dL (ref 8–23)
CO2: 24 mmol/L (ref 22–32)
Calcium: 8.5 mg/dL — ABNORMAL LOW (ref 8.9–10.3)
Chloride: 105 mmol/L (ref 98–111)
Creatinine, Ser: 0.88 mg/dL (ref 0.44–1.00)
GFR, Estimated: >60 mL/min (ref >60)
Glucose, Bld: 160 mg/dL — ABNORMAL HIGH (ref 70–99)
Potassium: 4.4 mmol/L (ref 3.5–5.1)
Sodium: 135 mmol/L (ref 135–145)
Total Bilirubin: 0.6 mg/dL (ref 0.0–1.2)
Total Protein: 6.5 g/dL (ref 6.5–8.1)

## 2023-08-26 LAB — CBC WITH DIFFERENTIAL/PLATELET
Abs Immature Granulocytes: 0.03 10*3/uL (ref 0.00–0.07)
Basophils Absolute: 0 10*3/uL (ref 0.0–0.1)
Basophils Relative: 0 %
Eosinophils Absolute: 0.1 10*3/uL (ref 0.0–0.5)
Eosinophils Relative: 2 %
HCT: 37.4 % (ref 36.0–46.0)
Hemoglobin: 11.9 g/dL — ABNORMAL LOW (ref 12.0–15.0)
Immature Granulocytes: 1 %
Lymphocytes Relative: 15 %
Lymphs Abs: 1 10*3/uL (ref 0.7–4.0)
MCH: 30.6 pg (ref 26.0–34.0)
MCHC: 31.8 g/dL (ref 30.0–36.0)
MCV: 96.1 fL (ref 80.0–100.0)
Monocytes Absolute: 0.4 10*3/uL (ref 0.1–1.0)
Monocytes Relative: 6 %
Neutro Abs: 4.9 10*3/uL (ref 1.7–7.7)
Neutrophils Relative %: 76 %
Platelets: 170 10*3/uL (ref 150–400)
RBC: 3.89 MIL/uL (ref 3.87–5.11)
RDW: 12.7 % (ref 11.5–15.5)
WBC: 6.4 10*3/uL (ref 4.0–10.5)
nRBC: 0 % (ref 0.0–0.2)

## 2023-08-26 LAB — LIPASE, BLOOD: Lipase: 37 U/L (ref 11–51)

## 2023-08-26 LAB — URINALYSIS, ROUTINE W REFLEX MICROSCOPIC
Bilirubin Urine: NEGATIVE
Glucose, UA: NEGATIVE mg/dL
Hgb urine dipstick: NEGATIVE
Ketones, ur: NEGATIVE mg/dL
Leukocytes,Ua: NEGATIVE
Nitrite: NEGATIVE
Protein, ur: NEGATIVE mg/dL
Specific Gravity, Urine: 1.018 (ref 1.005–1.030)
pH: 6 (ref 5.0–8.0)

## 2023-08-26 LAB — RESP PANEL BY RT-PCR (RSV, FLU A&B, COVID)  RVPGX2
Influenza A by PCR: NEGATIVE
Influenza B by PCR: NEGATIVE
Resp Syncytial Virus by PCR: NEGATIVE
SARS Coronavirus 2 by RT PCR: NEGATIVE

## 2023-08-26 MED ORDER — LACTATED RINGERS IV BOLUS
1000.0000 mL | Freq: Once | INTRAVENOUS | Status: AC
  Administered 2023-08-26: 1000 mL via INTRAVENOUS

## 2023-08-26 MED ORDER — ONDANSETRON HCL 4 MG/2ML IJ SOLN
4.0000 mg | Freq: Once | INTRAMUSCULAR | Status: AC
  Administered 2023-08-26: 4 mg via INTRAVENOUS
  Filled 2023-08-26: qty 2

## 2023-08-26 MED ORDER — MORPHINE SULFATE (PF) 4 MG/ML IV SOLN
4.0000 mg | Freq: Once | INTRAVENOUS | Status: AC
  Administered 2023-08-26: 4 mg via INTRAVENOUS
  Filled 2023-08-26: qty 1

## 2023-08-26 MED ORDER — IOHEXOL 300 MG/ML  SOLN
100.0000 mL | Freq: Once | INTRAMUSCULAR | Status: AC | PRN
  Administered 2023-08-26: 100 mL via INTRAVENOUS

## 2023-08-26 NOTE — Discharge Instructions (Addendum)
 Follow-up with Dr. Larrie Po for your hernia.  Apply ice packs for about 20 minutes at a time every hour and elevate your left arm until the swelling goes away.  If you develop increased pain in the arm, swelling, numbness, tingling, or if you develop new or worsening abdominal pain, vomiting, or any other new/concerning symptoms then return to the ER.  You may follow-up your COVID and flu results in the MyChart for your cough.

## 2023-08-26 NOTE — ED Notes (Signed)
Left arm elevated and ice pack applied ?

## 2023-08-26 NOTE — ED Notes (Signed)
 Pt asking to order lunch. Sandwich and soda provided. Pt refuses sandwich and soda stating "I want to order from downstairs I can't eat this mess." Pt informed that since she is not being admitted that lunch tray will not be provided.

## 2023-08-26 NOTE — ED Provider Notes (Signed)
 [place] EMERGENCY DEPARTMENT AT [facility] Provider Note   CSN: [id number] Arrival date & time: 08/26/23  9562     History  Chief Complaint  Patient presents with   Abdominal Pain    NAYSA DONIS is a 68 y.o. female.  HPI 68 year old female presents with abdominal pain, nausea, vomiting, diarrhea.  Patient was here couple days ago and had an inguinal hernia that was reduced and she states the bulge went away temporarily but was back out whenever she came home.  She has continued to have abdominal pain, primarily in the right lower abdomen as well as continued nausea and vomiting.  She denies any fevers.  She has had a little bit of mild diarrhea.  The pain is currently moderate.  Home Medications Prior to Admission medications   Medication Sig Start Date End Date Taking? Authorizing Provider  albuterol  (PROVENTIL  HFA;VENTOLIN  HFA) 108 (90 BASE) MCG/ACT inhaler Inhale 1-2 puffs into the lungs every 6 (six) hours as needed for wheezing. Patient taking differently: Inhale 2 puffs into the lungs every 6 (six) hours as needed for wheezing. 08/05/12   Kennieth Peaches, MD  aspirin EC 81 MG tablet Take 81 mg by mouth daily.    [provider]  calcium carbonate (OSCAL) 1500 (600 Ca) MG TABS tablet Take 600 mg of elemental calcium by mouth 2 (two) times daily with a meal.    [provider]  CARDIZEM  LA 360 MG 24 hr tablet Take 360 mg by mouth daily.  05/06/15   [provider]  celecoxib  (CELEBREX ) 200 MG capsule Take 200 mg by mouth 2 (two) times daily. 05/13/23   [provider]  cyclobenzaprine  (FLEXERIL ) 10 MG tablet Take 1 tablet (10 mg total) by mouth 2 (two) times daily as needed for muscle spasms. 12/07/20   Volney Grumbles, PA-C  diclofenac  Sodium (VOLTAREN ) 1 % GEL Apply 2 g topically 4 (four) times daily as needed. 09/07/20   Long, Joshua G, MD  fluconazole  (DIFLUCAN ) 150 MG tablet Take 150 mg by mouth once a week. 07/31/23    [provider]  ibuprofen  (ADVIL ) 600 MG tablet Take 1 tablet (600 mg total) by mouth every 6 (six) hours as needed. 09/01/20   Wynetta Heckle, MD  insulin  glargine (LANTUS ) 100 UNIT/ML injection Inject 10 Units into the skin at bedtime as needed (blood sugar over 280).  02/20/11 08/24/23  Towanda Fret, MD  JANUVIA 100 MG tablet Take 100 mg by mouth daily. 05/06/15   [provider]  lisinopril-hydrochlorothiazide (ZESTORETIC) 10-12.5 MG tablet Take 1 tablet by mouth daily. 07/31/23   [provider]  MOUNJARO 10 MG/0.5ML Pen Inject 10 mg into the skin once a week. 07/19/23   [provider]  Multiple Vitamin (MULTIVITAMIN WITH MINERALS) TABS tablet Take 1 tablet by mouth daily.    [provider]  oxyCODONE -acetaminophen  (PERCOCET) 10-325 MG tablet Take 1 tablet by mouth every 6 (six) hours as needed for pain.  05/10/15   [provider]  polyethylene glycol (MIRALAX  / GLYCOLAX ) packet Take 17 g by mouth daily as needed for mild constipation.    [provider]  vitamin B-12 (CYANOCOBALAMIN) 1000 MCG tablet Take 2,000 mcg by mouth daily.    [provider]      Allergies    Liraglutide    Review of Systems   Review of Systems  Constitutional:  Negative for fever.  Gastrointestinal:  Positive for abdominal pain, diarrhea, nausea and  vomiting. Negative for blood in stool.    Physical Exam Updated Vital Signs BP (!) 171/59 (BP Location: Right Arm)   Pulse 83   Temp 97.9 F (36.6 C) (Oral)   Resp 16   Ht 5\' 3"  (1.6 m)   Wt 68 kg   SpO2 96%   BMI 26.57 kg/m  Physical Exam Vitals and nursing note reviewed.  Constitutional:      General: She is not in acute distress.    Appearance: She is well-developed. She is not ill-appearing or diaphoretic.  HENT:     Head: Normocephalic and atraumatic.  Cardiovascular:     Rate and Rhythm: Normal rate and regular rhythm.     Heart sounds: Normal heart sounds.   Pulmonary:     Effort: Pulmonary effort is normal.     Breath sounds: Normal breath sounds.  Abdominal:     Palpations: Abdomen is soft.     Tenderness: There is abdominal tenderness in the right lower quadrant.     Hernia: A hernia is present. Hernia is present in the right inguinal area (easily reducible).  Skin:    General: Skin is warm and dry.  Neurological:     Mental Status: She is alert.     ED Results / Procedures / Treatments   Labs (all labs ordered are listed, but only abnormal results are displayed) Labs Reviewed  COMPREHENSIVE METABOLIC PANEL WITH GFR - Abnormal; Notable for the following components:      Result Value   Glucose, Bld 160 (*)    Calcium 8.5 (*)    Albumin 3.4 (*)    All other components within normal limits  CBC WITH DIFFERENTIAL/PLATELET - Abnormal; Notable for the following components:   Hemoglobin 11.9 (*)    All other components within normal limits  RESP PANEL BY RT-PCR (RSV, FLU A&B, COVID)  RVPGX2  LIPASE, BLOOD  URINALYSIS, ROUTINE W REFLEX MICROSCOPIC    EKG None  Radiology DG Chest Portable 1 View Result Date: 08/26/2023 CLINICAL DATA:  Cough. Right lower quadrant abdominal pain with nausea and vomiting for 2 days. EXAM: PORTABLE CHEST 1 VIEW COMPARISON:  Abdominopelvic CT same date and 08/24/2023. Chest radiographs 08/24/2023. FINDINGS: 1110 hours. The heart size and mediastinal contours are stable. There is aortic atherosclerosis. The lungs remain clear. No pleural effusion or pneumothorax. No evidence of pneumoperitoneum. Stable mild degenerative changes in the spine without evidence of acute osseous abnormality. IMPRESSION: No evidence of acute cardiopulmonary process. Electronically Signed   By: Elmon Hagedorn M.D.   On: 08/26/2023 11:53   CT ABDOMEN PELVIS W CONTRAST Result Date: 08/26/2023 CLINICAL DATA:  Two day history of right lower quadrant pain, nausea, and vomiting EXAM: CT ABDOMEN AND PELVIS WITH CONTRAST TECHNIQUE:  Multidetector CT imaging of the abdomen and pelvis was performed using the standard protocol following bolus administration of intravenous contrast. RADIATION DOSE REDUCTION: This exam was performed according to the departmental dose-optimization program which includes automated exposure control, adjustment of the mA and/or kV according to patient size and/or use of iterative reconstruction technique. CONTRAST:  OMNIPAQUE  IOHEXOL  300 MG/ML  SOLN COMPARISON:  CT abdomen and pelvis dated 08/24/2023 FINDINGS: Lower chest: No focal consolidation or pulmonary nodule in the lung bases. No pleural effusion or pneumothorax demonstrated. Partially imaged heart size is normal. Hepatobiliary: Unchanged ill-defined hypodensity associated with calcific foci in segment 4A. No intra or extrahepatic biliary ductal dilation. Normal gallbladder. Pancreas: No focal lesions or main ductal dilation. Spleen: Normal in size  without focal abnormality. Adrenals/Urinary Tract: No adrenal nodules. No suspicious renal mass, calculi or hydronephrosis. No focal bladder wall thickening. Stomach/Bowel: Postsurgical changes of Roux-en-Y gastric bypass. Interval decreased size of previously noted small bowel dilation with largely decompressed small bowel loops throughout. Marked gas-filled dilation of the rectum. Moderate volume stool throughout the colon. Normal appendix. Vascular/Lymphatic: Aortic atherosclerosis. No enlarged abdominal or pelvic lymph nodes. Reproductive: No adnexal masses. Other: Trace mesenteric free fluid. No free air or fluid collection. Musculoskeletal: No acute or abnormal lytic or blastic osseous lesions. Right inguinal hernia is again seen containing a single loop of nonobstructed small bowel. IMPRESSION: 1. Persistent right inguinal hernia containing a single loop of nonobstructed small bowel. 2. Interval decreased size of previously noted small bowel dilation with largely decompressed small bowel loops throughout.  Normal appendix. 3. Marked gas-filled dilation of the rectum. Moderate volume stool throughout the colon. 4.  Aortic Atherosclerosis (ICD10-I70.0). Electronically Signed   By: Limin  Xu M.D.   On: 08/26/2023 10:07   CT ABDOMEN PELVIS W CONTRAST Result Date: 08/24/2023 CLINICAL DATA:  Abdominal pain, acute, nonlocalized. EXAM: CT ABDOMEN AND PELVIS WITH CONTRAST TECHNIQUE: Multidetector CT imaging of the abdomen and pelvis was performed using the standard protocol following bolus administration of intravenous contrast. RADIATION DOSE REDUCTION: This exam was performed according to the departmental dose-optimization program which includes automated exposure control, adjustment of the mA and/or kV according to patient size and/or use of iterative reconstruction technique. CONTRAST:  OMNIPAQUE  IOHEXOL  300 MG/ML  SOLN COMPARISON:  CT scan renal stone protocol from 12/23/2021. FINDINGS: Lower chest: The lung bases are clear. No pleural effusion. The heart is normal in size. No pericardial effusion. Hepatobiliary: The liver is normal in size. Non-cirrhotic configuration. No suspicious mass. Redemonstration of subcapsular ill-defined hypoattenuating focus in the left hepatic lobe, segment 4A with associated few sub 5 mm dystrophic calcifications. This is incompletely characterized but without significant interval change. No intrahepatic or extrahepatic bile duct dilation. No calcified gallstones. Normal gallbladder wall thickness. No pericholecystic inflammatory changes. Pancreas: Unremarkable. No pancreatic ductal dilatation or surrounding inflammatory changes. Spleen: Within normal limits. No focal lesion. Adrenals/Urinary Tract: Adrenal glands are unremarkable. No suspicious renal mass. Left extrarenal pelvis noted. No obstructive uropathy or nephroureterolithiasis on either side. Urinary bladder is under distended, precluding optimal assessment. However, no large mass or stones identified. No perivesical fat  stranding. Stomach/Bowel: Postsurgical changes from prior gastric bypass noted. There is a small sliding hiatal hernia. There is a new, small right inguinal hernia containing portion of small bowel loop. Proximal to this hernia sac, there are multiple dilated small bowel loops exhibiting fecalization. The bowel loops are dilated measuring up to 3.1 cm in diameter. No abnormal bowel wall thickening or surrounding fat stranding. Distal to this hernia sac, remaining small bowel loops are collapsed. Colon is also collapsed and exhibits small to moderate stool burden. The appendix is unremarkable. There are multiple diverticula mainly in the left hemi colon, without imaging signs of diverticulitis. Vascular/Lymphatic: No ascites or pneumoperitoneum. No abdominal or pelvic lymphadenopathy, by size criteria. No aneurysmal dilation of the major abdominal arteries. There are mild peripheral atherosclerotic vascular calcifications of the aorta and its major branches. Reproductive: The uterus is surgically absent. No large adnexal mass. Other: Right inguinal hernia, as described above. There is mild to moderate anasarca. The soft tissues and abdominal wall are otherwise unremarkable. Musculoskeletal: No suspicious osseous lesions. There are mild multilevel degenerative changes in the visualized spine. IMPRESSION: 1. There is a  small right inguinal hernia containing portion of small bowel loop. There is resultant small bowel obstruction proximal to the hernia. No evidence of bowel ischemia or pneumoperitoneum. 2. Multiple other nonacute observations, as described above. Aortic Atherosclerosis (ICD10-I70.0). Electronically Signed   By: Beula Brunswick M.D.   On: 08/24/2023 17:02    Procedures Procedures    Medications Ordered in ED Medications  lactated ringers  bolus 1,000 mL (0 mLs Intravenous Stopped 08/26/23 1243)  morphine  (PF) 4 MG/ML injection 4 mg (4 mg Intravenous Given 08/26/23 0747)  ondansetron  (ZOFRAN )  injection 4 mg (4 mg Intravenous Given 08/26/23 0747)  iohexol  (OMNIPAQUE ) 300 MG/ML solution 100 mL (100 mLs Intravenous Contrast Given 08/26/23 0827)    ED Course/ Medical Decision Making/ A&P                                 Medical Decision Making Amount and/or Complexity of Data Reviewed Labs: ordered.    Details: Normal WBC Radiology: ordered and independent interpretation performed.    Details: Inguinal hernia No pneumonia  Risk Prescription drug management.   Patient has an easily reducible hernia.  CT shows improvement with persistent hernia but no significant obstruction.  Patient's symptoms are under control and she is asking to eat.  I did discuss with Dr. Larrie Po, who has evaluated patient and feel she is stable for continued outpatient management.  While getting her CT her IV infiltrated and she had contrast extravasation.  Contrast extravasation protocol was given and she had heat and later ice applied.  Swelling is improving.  Intact pulses.  No evidence of compartment syndrome on exam.  This seems to be improving to the point that I think she can continue the supportive care at home.  She also has had a cough for couple days and a COVID test has been ordered but she will follow this up as an outpatient.  X-ray is negative for pneumonia.  Likely of viral process.  Will give return precautions.        Final Clinical Impression(s) / ED Diagnoses Final diagnoses:  Right inguinal hernia  Extravasation of intravenous contrast medium    Rx / DC Orders ED Discharge Orders     None         Jerilynn Montenegro, MD 08/26/23 1451

## 2023-08-26 NOTE — ED Notes (Signed)
 Pt in CT.

## 2023-08-26 NOTE — ED Triage Notes (Signed)
 Pt c/o of RLQ pain with n/v x 2 days. Pt was seen on 5/6. States had a CT scan with contrast and states since then, " I have been sick to my stomach" PT also was referred to [facility] for a hernia.

## 2023-08-27 ENCOUNTER — Telehealth: Admitting: Physician Assistant

## 2023-08-27 DIAGNOSIS — J069 Acute upper respiratory infection, unspecified: Secondary | ICD-10-CM

## 2023-08-27 DIAGNOSIS — B9689 Other specified bacterial agents as the cause of diseases classified elsewhere: Secondary | ICD-10-CM

## 2023-08-27 MED ORDER — AMOXICILLIN-POT CLAVULANATE 875-125 MG PO TABS: 1.0000 | ORAL_TABLET | Freq: Two times a day (BID) | ORAL | 0 refills | Status: DC

## 2023-08-27 MED ORDER — BENZONATATE 100 MG PO CAPS: 100.0000 mg | ORAL_CAPSULE | Freq: Three times a day (TID) | ORAL | 0 refills | Status: DC | PRN
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
## 2023-08-27 NOTE — Patient Instructions (Signed)
 Lisa Todd, thank you for joining Angelia Kelp, PA-C for today's virtual visit.  While this provider is not your primary care provider (PCP), if your PCP is located in our provider database this encounter information will be shared with them immediately following your visit.   A [facility] MyChart account gives you access to today's visit and all your visits, tests, and labs performed at [facility] " click here if you don't have a [facility] MyChart account or go to [email or web address]  Consent: (Patient) Lisa Todd provided verbal consent for this virtual visit at the beginning of the encounter.  Current Medications:  Current Outpatient Medications:    amoxicillin -clavulanate (AUGMENTIN) 875-125 MG tablet, Take 1 tablet by mouth 2 (two) times daily., Disp: 20 tablet, Rfl: 0   benzonatate  (TESSALON ) 100 MG capsule, Take 1-2 capsules (100-200 mg total) by mouth 3 (three) times daily as needed., Disp: 30 capsule, Rfl: 0   albuterol  (PROVENTIL  HFA;VENTOLIN  HFA) 108 (90 BASE) MCG/ACT inhaler, Inhale 1-2 puffs into the lungs every 6 (six) hours as needed for wheezing. (Patient taking differently: Inhale 2 puffs into the lungs every 6 (six) hours as needed for wheezing.), Disp: 1 Inhaler, Rfl: 0   aspirin EC 81 MG tablet, Take 81 mg by mouth daily., Disp: , Rfl:    calcium carbonate (OSCAL) 1500 (600 Ca) MG TABS tablet, Take 600 mg of elemental calcium by mouth 2 (two) times daily with a meal., Disp: , Rfl:    CARDIZEM  LA 360 MG 24 hr tablet, Take 360 mg by mouth daily. , Disp: , Rfl:    celecoxib  (CELEBREX ) 200 MG capsule, Take 200 mg by mouth 2 (two) times daily., Disp: , Rfl:    cyclobenzaprine  (FLEXERIL ) 10 MG tablet, Take 1 tablet (10 mg total) by mouth 2 (two) times daily as needed for muscle spasms., Disp: 20 tablet, Rfl: 0   diclofenac  Sodium (VOLTAREN ) 1 % GEL, Apply 2 g topically 4 (four) times daily as needed., Disp: 50 g, Rfl: 0   fluconazole   (DIFLUCAN ) 150 MG tablet, Take 150 mg by mouth once a week., Disp: , Rfl:    ibuprofen  (ADVIL ) 600 MG tablet, Take 1 tablet (600 mg total) by mouth every 6 (six) hours as needed., Disp: 15 tablet, Rfl: 0   insulin  glargine (LANTUS ) 100 UNIT/ML injection, Inject 10 Units into the skin at bedtime as needed (blood sugar over 280). , Disp: , Rfl:    JANUVIA 100 MG tablet, Take 100 mg by mouth daily., Disp: , Rfl:    lisinopril-hydrochlorothiazide (ZESTORETIC) 10-12.5 MG tablet, Take 1 tablet by mouth daily., Disp: , Rfl:    MOUNJARO 10 MG/0.5ML Pen, Inject 10 mg into the skin once a week., Disp: , Rfl:    Multiple Vitamin (MULTIVITAMIN WITH MINERALS) TABS tablet, Take 1 tablet by mouth daily., Disp: , Rfl:    oxyCODONE -acetaminophen  (PERCOCET) 10-325 MG tablet, Take 1 tablet by mouth every 6 (six) hours as needed for pain. , Disp: , Rfl:    polyethylene glycol (MIRALAX  / GLYCOLAX ) packet, Take 17 g by mouth daily as needed for mild constipation., Disp: , Rfl:    vitamin B-12 (CYANOCOBALAMIN) 1000 MCG tablet, Take 2,000 mcg by mouth daily., Disp: , Rfl:    Medications ordered in this encounter:  Meds ordered this encounter  Medications   amoxicillin -clavulanate (AUGMENTIN) 875-125 MG tablet    Sig: Take 1 tablet by mouth 2 (two) times daily.    Dispense:  20 tablet  Refill:  0    Supervising Provider:   Corine Dice [[id number]]   benzonatate  (TESSALON ) 100 MG capsule    Sig: Take 1-2 capsules (100-200 mg total) by mouth 3 (three) times daily as needed.    Dispense:  30 capsule    Refill:  0    Supervising Provider:   Corine Dice [[id number]]     *If you need refills on other medications prior to your next appointment, please contact your pharmacy*  Follow-Up: Call back or seek an in-person evaluation if the symptoms worsen or if the condition fails to improve as anticipated.  [facility] [phone]  Other Instructions Upper Respiratory Infection, Adult An upper  respiratory infection (URI) is a common viral infection of the nose, throat, and upper air passages that lead to the lungs. The most common type of URI is the common cold. URIs usually get better on their own, without medical treatment. What are the causes? A URI is caused by a virus. You may catch a virus by: Breathing in droplets from an infected person's cough or sneeze. Touching something that has been exposed to the virus (is contaminated) and then touching your mouth, nose, or eyes. What increases the risk? You are more likely to get a URI if: You are very young or very old. You have close contact with others, such as at work, school, or a health care facility. You smoke. You have long-term (chronic) heart or lung disease. You have a weakened disease-fighting system (immune system). You have nasal allergies or asthma. You are experiencing a lot of stress. You have poor nutrition. What are the signs or symptoms? A URI usually involves some of the following symptoms: Runny or stuffy (congested) nose. Cough. Sneezing. Sore throat. Headache. Fatigue. Fever. Loss of appetite. Pain in your forehead, behind your eyes, and over your cheekbones (sinus pain). Muscle aches. Redness or irritation of the eyes. Pressure in the ears or face. How is this diagnosed? This condition may be diagnosed based on your medical history and symptoms, and a physical exam. Your health care provider may use a swab to take a mucus sample from your nose (nasal swab). This sample can be tested to determine what virus is causing the illness. How is this treated? URIs usually get better on their own within 7-10 days. Medicines cannot cure URIs, but your health care provider may recommend certain medicines to help relieve symptoms, such as: Over-the-counter cold medicines. Cough suppressants. Coughing is a type of defense against infection that helps to clear the respiratory system, so take these medicines only  as recommended by your health care provider. Fever-reducing medicines. Follow these instructions at home: Activity Rest as needed. If you have a fever, stay home from work or school until your fever is gone or until your health care provider says your URI cannot spread to other people (is no longer contagious). Your health care provider may have you wear a face mask to prevent your infection from spreading. Relieving symptoms Gargle with a mixture of salt and water 3-4 times a day or as needed. To make salt water, completely dissolve -1 tsp (3-6 g) of salt in 1 cup (237 mL) of warm water. Use a cool-mist humidifier to add moisture to the air. This can help you breathe more easily. Eating and drinking  Drink enough fluid to keep your urine pale yellow. Eat soups and other clear broths. General instructions  Take over-the-counter and prescription medicines only as told  by your health care provider. These include cold medicines, fever reducers, and cough suppressants. Do not use any products that contain nicotine or tobacco. These products include cigarettes, chewing tobacco, and vaping devices, such as e-cigarettes. If you need help quitting, ask your health care provider. Stay away from secondhand smoke. Stay up to date on all immunizations, including the yearly (annual) flu vaccine. Keep all follow-up visits. This is important. How to prevent the spread of infection to others URIs can be contagious. To prevent the infection from spreading: Wash your hands with soap and water for at least 20 seconds. If soap and water are not available, use hand sanitizer. Avoid touching your mouth, face, eyes, or nose. Cough or sneeze into a tissue or your sleeve or elbow instead of into your hand or into the air.  Contact a health care provider if: You are getting worse instead of better. You have a fever or chills. Your mucus is brown or red. You have yellow or brown discharge coming from your  nose. You have pain in your face, especially when you bend forward. You have swollen neck glands. You have pain while swallowing. You have white areas in the back of your throat. Get help right away if: You have shortness of breath that gets worse. You have severe or persistent: Headache. Ear pain. Sinus pain. Chest pain. You have chronic lung disease along with any of the following: Making high-pitched whistling sounds when you breathe, most often when you breathe out (wheezing). Prolonged cough (more than 14 days). Coughing up blood. A change in your usual mucus. You have a stiff neck. You have changes in your: Vision. Hearing. Thinking. Mood. These symptoms may be an emergency. Get help right away. Call 911. Do not wait to see if the symptoms will go away. Do not drive yourself to the hospital. Summary An upper respiratory infection (URI) is a common infection of the nose, throat, and upper air passages that lead to the lungs. A URI is caused by a virus. URIs usually get better on their own within 7-10 days. Medicines cannot cure URIs, but your health care provider may recommend certain medicines to help relieve symptoms. This information is not intended to replace advice given to you by your health care provider. Make sure you discuss any questions you have with your health care provider. Document Revised: 11/06/2020 Document Reviewed: 11/06/2020 Elsevier Patient Education  2024 Elsevier Inc.   If you have been instructed to have an in-person evaluation today at a local Urgent Care facility, please use the link below. It will take you to a list of all of our available [facility], including address, phone number and hours of operation. Please do not delay care.  [facility]  If you or a family member do not have a primary care provider, use the link below to schedule a visit and establish care. When you choose a [facility] primary care physician  or advanced practice provider, you gain a long-term partner in health. Find a Primary Care Provider  Learn more about [facility]'s in-office and virtual care options: [facility] - Get Care Now

## 2023-09-10 ENCOUNTER — Other Ambulatory Visit: Payer: Self-pay

## 2023-09-10 ENCOUNTER — Emergency Department (HOSPITAL_COMMUNITY)

## 2023-09-10 ENCOUNTER — Inpatient Hospital Stay (HOSPITAL_COMMUNITY)
Admission: EM | Admit: 2023-09-10 | Discharge: 2023-09-14 | DRG: 389 | Disposition: A | Discharge status: Home / Self Care | Attending: Internal Medicine | Admitting: Internal Medicine

## 2023-09-10 DIAGNOSIS — E669 Obesity, unspecified: Secondary | ICD-10-CM | POA: Diagnosis present

## 2023-09-10 DIAGNOSIS — K56600 Partial intestinal obstruction, unspecified as to cause: Secondary | ICD-10-CM | POA: Diagnosis present

## 2023-09-10 DIAGNOSIS — Z8249 Family history of ischemic heart disease and other diseases of the circulatory system: Secondary | ICD-10-CM | POA: Diagnosis not present

## 2023-09-10 DIAGNOSIS — Z833 Family history of diabetes mellitus: Secondary | ICD-10-CM | POA: Diagnosis not present

## 2023-09-10 DIAGNOSIS — Z888 Allergy status to other drugs, medicaments and biological substances status: Secondary | ICD-10-CM

## 2023-09-10 DIAGNOSIS — R300 Dysuria: Secondary | ICD-10-CM | POA: Diagnosis not present

## 2023-09-10 DIAGNOSIS — Z79899 Other long term (current) drug therapy: Secondary | ICD-10-CM | POA: Diagnosis not present

## 2023-09-10 DIAGNOSIS — E871 Hypo-osmolality and hyponatremia: Secondary | ICD-10-CM | POA: Diagnosis present

## 2023-09-10 DIAGNOSIS — E785 Hyperlipidemia, unspecified: Secondary | ICD-10-CM | POA: Diagnosis present

## 2023-09-10 DIAGNOSIS — Z7984 Long term (current) use of oral hypoglycemic drugs: Secondary | ICD-10-CM | POA: Diagnosis not present

## 2023-09-10 DIAGNOSIS — Z961 Presence of intraocular lens: Secondary | ICD-10-CM | POA: Diagnosis present

## 2023-09-10 DIAGNOSIS — Z90710 Acquired absence of both cervix and uterus: Secondary | ICD-10-CM

## 2023-09-10 DIAGNOSIS — Z9841 Cataract extraction status, right eye: Secondary | ICD-10-CM | POA: Diagnosis not present

## 2023-09-10 DIAGNOSIS — Z7982 Long term (current) use of aspirin: Secondary | ICD-10-CM

## 2023-09-10 DIAGNOSIS — Z9842 Cataract extraction status, left eye: Secondary | ICD-10-CM | POA: Diagnosis not present

## 2023-09-10 DIAGNOSIS — I10 Essential (primary) hypertension: Secondary | ICD-10-CM | POA: Diagnosis present

## 2023-09-10 DIAGNOSIS — E1165 Type 2 diabetes mellitus with hyperglycemia: Secondary | ICD-10-CM | POA: Diagnosis present

## 2023-09-10 DIAGNOSIS — F419 Anxiety disorder, unspecified: Secondary | ICD-10-CM | POA: Diagnosis present

## 2023-09-10 DIAGNOSIS — Z792 Long term (current) use of antibiotics: Secondary | ICD-10-CM

## 2023-09-10 DIAGNOSIS — K4090 Unilateral inguinal hernia, without obstruction or gangrene, not specified as recurrent: Secondary | ICD-10-CM

## 2023-09-10 DIAGNOSIS — N179 Acute kidney failure, unspecified: Secondary | ICD-10-CM

## 2023-09-10 DIAGNOSIS — R339 Retention of urine, unspecified: Secondary | ICD-10-CM | POA: Diagnosis not present

## 2023-09-10 DIAGNOSIS — Z9884 Bariatric surgery status: Secondary | ICD-10-CM | POA: Diagnosis not present

## 2023-09-10 DIAGNOSIS — Z7985 Long-term (current) use of injectable non-insulin antidiabetic drugs: Secondary | ICD-10-CM

## 2023-09-10 DIAGNOSIS — K56609 Unspecified intestinal obstruction, unspecified as to partial versus complete obstruction: Principal | ICD-10-CM | POA: Diagnosis present

## 2023-09-10 DIAGNOSIS — Z825 Family history of asthma and other chronic lower respiratory diseases: Secondary | ICD-10-CM

## 2023-09-10 DIAGNOSIS — R0609 Other forms of dyspnea: Secondary | ICD-10-CM | POA: Diagnosis not present

## 2023-09-10 DIAGNOSIS — Z6826 Body mass index (BMI) 26.0-26.9, adult: Secondary | ICD-10-CM

## 2023-09-10 DIAGNOSIS — J45909 Unspecified asthma, uncomplicated: Secondary | ICD-10-CM | POA: Diagnosis present

## 2023-09-10 DIAGNOSIS — E119 Type 2 diabetes mellitus without complications: Secondary | ICD-10-CM

## 2023-09-10 LAB — COMPREHENSIVE METABOLIC PANEL WITH GFR
ALT: 20 U/L (ref 0–44)
AST: 24 U/L (ref 15–41)
Albumin: 3.8 g/dL (ref 3.5–5.0)
Alkaline Phosphatase: 52 U/L (ref 38–126)
Anion gap: 12 (ref 5–15)
BUN: 34 mg/dL — ABNORMAL HIGH (ref 8–23)
CO2: 16 mmol/L — ABNORMAL LOW (ref 22–32)
Calcium: 8.5 mg/dL — ABNORMAL LOW (ref 8.9–10.3)
Chloride: 103 mmol/L (ref 98–111)
Creatinine, Ser: 1.23 mg/dL — ABNORMAL HIGH (ref 0.44–1.00)
GFR, Estimated: 48 mL/min — ABNORMAL LOW (ref >60)
Glucose, Bld: 229 mg/dL — ABNORMAL HIGH (ref 70–99)
Potassium: 4 mmol/L (ref 3.5–5.1)
Sodium: 131 mmol/L — ABNORMAL LOW (ref 135–145)
Total Bilirubin: 0.7 mg/dL (ref 0.0–1.2)
Total Protein: 7.2 g/dL (ref 6.5–8.1)

## 2023-09-10 LAB — CBC WITH DIFFERENTIAL/PLATELET
Abs Immature Granulocytes: 0.04 10*3/uL (ref 0.00–0.07)
Basophils Absolute: 0 10*3/uL (ref 0.0–0.1)
Basophils Relative: 0 %
Eosinophils Absolute: 0 10*3/uL (ref 0.0–0.5)
Eosinophils Relative: 0 %
HCT: 37.3 % (ref 36.0–46.0)
Hemoglobin: 12 g/dL (ref 12.0–15.0)
Immature Granulocytes: 0 %
Lymphocytes Relative: 4 %
Lymphs Abs: 0.6 10*3/uL — ABNORMAL LOW (ref 0.7–4.0)
MCH: 30 pg (ref 26.0–34.0)
MCHC: 32.2 g/dL (ref 30.0–36.0)
MCV: 93.3 fL (ref 80.0–100.0)
Monocytes Absolute: 0.7 10*3/uL (ref 0.1–1.0)
Monocytes Relative: 6 %
Neutro Abs: 11.7 10*3/uL — ABNORMAL HIGH (ref 1.7–7.7)
Neutrophils Relative %: 90 %
Platelets: 168 10*3/uL (ref 150–400)
RBC: 4 MIL/uL (ref 3.87–5.11)
RDW: 12.7 % (ref 11.5–15.5)
WBC: 13 10*3/uL — ABNORMAL HIGH (ref 4.0–10.5)
nRBC: 0 % (ref 0.0–0.2)

## 2023-09-10 LAB — TROPONIN I (HIGH SENSITIVITY)
Troponin I (High Sensitivity): 4 ng/L (ref <18)
Troponin I (High Sensitivity): 6 ng/L (ref <18)

## 2023-09-10 MED ORDER — ADULT MULTIVITAMIN W/MINERALS CH
1.0000 | ORAL_TABLET | Freq: Every day | ORAL | Status: DC
  Administered 2023-09-11 – 2023-09-13 (×3): 1 via ORAL
  Filled 2023-09-10 (×3): qty 1

## 2023-09-10 MED ORDER — ACETAMINOPHEN 650 MG RE SUPP
650.0000 mg | Freq: Four times a day (QID) | RECTAL | Status: DC | PRN
Start: 2023-09-10 — End: 2023-09-14

## 2023-09-10 MED ORDER — ONDANSETRON HCL 4 MG/2ML IJ SOLN: 4.0000 mg | Freq: Four times a day (QID) | INTRAMUSCULAR | Status: DC | PRN

## 2023-09-10 MED ORDER — LISINOPRIL-HYDROCHLOROTHIAZIDE 10-12.5 MG PO TABS: 1.0000 | ORAL_TABLET | Freq: Every day | ORAL | Status: DC

## 2023-09-10 MED ORDER — DILTIAZEM HCL ER 360 MG PO TB24: 360.0000 mg | ORAL_TABLET | Freq: Every day | ORAL | Status: DC

## 2023-09-10 MED ORDER — STERILE WATER FOR INJECTION IJ SOLN
INTRAMUSCULAR | Status: AC
  Administered 2023-09-11: 10 mL via INTRAMUSCULAR
  Filled 2023-09-10: qty 10

## 2023-09-10 MED ORDER — IOHEXOL 300 MG/ML  SOLN
100.0000 mL | Freq: Once | INTRAMUSCULAR | Status: AC | PRN
  Administered 2023-09-10: 100 mL via INTRAVENOUS

## 2023-09-10 MED ORDER — POLYETHYLENE GLYCOL 3350 17 G PO PACK: 17.0000 g | PACK | Freq: Every day | ORAL | Status: DC | PRN

## 2023-09-10 MED ORDER — OXYCODONE-ACETAMINOPHEN 5-325 MG PO TABS: 1.0000 | ORAL_TABLET | Freq: Four times a day (QID) | ORAL | Status: DC | PRN

## 2023-09-10 MED ORDER — ONDANSETRON HCL 4 MG PO TABS: 4.0000 mg | ORAL_TABLET | Freq: Four times a day (QID) | ORAL | Status: DC | PRN

## 2023-09-10 MED ORDER — HYDROCHLOROTHIAZIDE 12.5 MG PO TABS: 12.5000 mg | ORAL_TABLET | Freq: Every day | ORAL | Status: DC

## 2023-09-10 MED ORDER — HYDROMORPHONE HCL 1 MG/ML IJ SOLN
1.0000 mg | Freq: Once | INTRAMUSCULAR | Status: AC
  Administered 2023-09-10: 1 mg via INTRAVENOUS
  Filled 2023-09-10: qty 1

## 2023-09-10 MED ORDER — ONDANSETRON HCL 4 MG/2ML IJ SOLN
4.0000 mg | Freq: Once | INTRAMUSCULAR | Status: AC
  Administered 2023-09-10: 4 mg via INTRAVENOUS
  Filled 2023-09-10: qty 2

## 2023-09-10 MED ORDER — MORPHINE SULFATE (PF) 2 MG/ML IV SOLN
2.0000 mg | INTRAVENOUS | Status: DC | PRN
  Administered 2023-09-11 (×3): 2 mg via INTRAVENOUS
  Filled 2023-09-10 (×4): qty 1

## 2023-09-10 MED ORDER — ENOXAPARIN SODIUM 40 MG/0.4ML IJ SOSY
40.0000 mg | PREFILLED_SYRINGE | INTRAMUSCULAR | Status: DC
  Administered 2023-09-11 – 2023-09-14 (×4): 40 mg via SUBCUTANEOUS
  Filled 2023-09-10 (×4): qty 0.4

## 2023-09-10 MED ORDER — MORPHINE SULFATE (PF) 4 MG/ML IV SOLN
4.0000 mg | Freq: Once | INTRAVENOUS | Status: AC
  Administered 2023-09-10: 4 mg via INTRAVENOUS
  Filled 2023-09-10: qty 1

## 2023-09-10 MED ORDER — PANTOPRAZOLE SODIUM 40 MG IV SOLR
40.0000 mg | Freq: Two times a day (BID) | INTRAVENOUS | Status: DC
  Administered 2023-09-11 – 2023-09-14 (×8): 40 mg via INTRAVENOUS
  Filled 2023-09-10 (×8): qty 10

## 2023-09-10 MED ORDER — DILTIAZEM HCL ER COATED BEADS 180 MG PO CP24
360.0000 mg | ORAL_CAPSULE | Freq: Every day | ORAL | Status: DC
Start: 2023-09-11 — End: 2023-09-10

## 2023-09-10 MED ORDER — ACETAMINOPHEN 325 MG PO TABS
650.0000 mg | ORAL_TABLET | Freq: Four times a day (QID) | ORAL | Status: DC | PRN
  Filled 2023-09-10: qty 2

## 2023-09-10 MED ORDER — INSULIN ASPART 100 UNIT/ML IJ SOLN
0.0000 [IU] | Freq: Three times a day (TID) | INTRAMUSCULAR | Status: DC
  Administered 2023-09-11: 1 [IU] via SUBCUTANEOUS

## 2023-09-10 MED ORDER — OXYCODONE HCL 5 MG PO TABS: 5.0000 mg | ORAL_TABLET | Freq: Four times a day (QID) | ORAL | Status: DC | PRN

## 2023-09-10 MED ORDER — LACTATED RINGERS IV BOLUS
1000.0000 mL | Freq: Once | INTRAVENOUS | Status: AC
  Administered 2023-09-10: 1000 mL via INTRAVENOUS

## 2023-09-10 MED ORDER — CYCLOBENZAPRINE HCL 10 MG PO TABS: 10.0000 mg | ORAL_TABLET | Freq: Two times a day (BID) | ORAL | Status: DC | PRN

## 2023-09-10 MED ORDER — LISINOPRIL 10 MG PO TABS: 10.0000 mg | ORAL_TABLET | Freq: Every day | ORAL | Status: DC

## 2023-09-10 MED ORDER — SODIUM CHLORIDE 0.9 % IV SOLN: INTRAVENOUS | Status: DC

## 2023-09-10 MED ORDER — OXYCODONE-ACETAMINOPHEN 10-325 MG PO TABS: 1.0000 | ORAL_TABLET | Freq: Four times a day (QID) | ORAL | Status: DC | PRN

## 2023-09-10 NOTE — ED Notes (Addendum)
 Pt ambulated to the bathroom with NT and family member. Pt able to urinate. Will bladder scan to check for residual urine

## 2023-09-10 NOTE — ED Notes (Signed)
 Pt ambulated x2 to bathroom

## 2023-09-10 NOTE — Assessment & Plan Note (Signed)
 Continue inpatient monitoring,

## 2023-09-10 NOTE — Assessment & Plan Note (Addendum)
 Hold on antihypertensive agents for now due to risk of hypotension  At home she is on lisinopril and hydrochlorothiazide.  Hold on diltiazem  due to bradycardia.

## 2023-09-10 NOTE — H&P (Signed)
 History and Physical    Patient: Lisa Todd YQM:[id number] DOB: [birth year] DOA: 09/10/2023 DOS: the patient was seen and examined on 09/10/2023 PCP: Narda Bacon, MD  Patient coming from: Home  Chief Complaint:  Chief Complaint  Patient presents with   Near Syncope   HPI: ALIANNAH Todd is a 68 y.o. female with medical history significant of asthma, T2DM, depression, hyperlipidemia, and GERD who presented with worsening abdominal pain. 05/08 ED visit for right inguinal hernia, it was manually reduced and patient was discharged home.   At home she continue with mild abdominal pain, not completely resolved, but today around 3 pm she developed severe abdominal pain with nausea and vomiting.  Because severity of her pain she had a near syncope episode, EMS was called. She was found bradycardic in the 40's, received atropine and was transported to the ED.  At the time of my examination she has right lower quadrant abdominal pain, moderate to severe in intensity, worse to touch and movement, continue to have nausea but has not vomiting.  No recent bowel movement and denies passing flatus.  Limited information due to patient being in pain and not very communicative     Review of Systems: unable to review all systems due to the inability of the patient to answer questions. Past Medical History:  Diagnosis Date   Arthritis    Asthma    Chronic back pain    Depression    Diabetes mellitus    hasn't take insulin  since weight loss 2020   Difficult intravenous access    required ultrasound for placement   GERD (gastroesophageal reflux disease)    hx of    Hyperlipidemia    Hypertension    Obesity    Sleep apnea    uses O2 2 liters  / currently has no cpap    Past Surgical History:  Procedure Laterality Date   ABDOMINAL HYSTERECTOMY     BLADDER REPAIR W/ CESAREAN SECTION  1989   CATARACT EXTRACTION W/PHACO Left 08/28/2019   Procedure: CATARACT EXTRACTION PHACO AND INTRAOCULAR LENS  PLACEMENT (IOC) CDE: 18.46;  Surgeon: Tarri Farm, MD;  Location: [facility];  Service: Ophthalmology;  Laterality: Left;   CATARACT EXTRACTION W/PHACO Right 10/02/2019   Procedure: CATARACT EXTRACTION PHACO AND INTRAOCULAR LENS PLACEMENT (IOC);  Surgeon: Tarri Farm, MD;  Location: [facility];  Service: Ophthalmology;  Laterality: Right;  CDE: 8.13   GASTRIC ROUX-EN-Y N/A 06/24/2015   Procedure: LAPAROSCOPIC ROUX-EN-Y GASTRIC BYPASS WITH UPPER ENDOSCOPY;  Surgeon: Ayesha Lente, MD;  Location: [facility];  Service: General;  Laterality: N/A;   knee left arthroscopy  2004   PANNICULECTOMY Bilateral 09/04/2016   Procedure: PANNICULECTOMY;  Surgeon: Ayesha Lente, MD;  Location: [facility];  Service: General;  Laterality: Bilateral;   right knee surgery  2007   right rotator cuff surgery  11/26/2009   VESICOVAGINAL FISTULA CLOSURE W/ TAH  2002   Social History:  reports that she has never smoked. She has never used smokeless tobacco. She reports current alcohol use. She reports that she does not use drugs.  Allergies  Allergen Reactions   Liraglutide Hives and Other (See Comments)    Sweating     Family History  Problem Relation Age of Onset   Breast cancer Mother    Diabetes Mother    Hypertension Mother    Asthma Father    Diabetes Father    Hypertension Father    Diabetes Brother    Hypertension Brother  Diabetes Sister    Hypertension Sister     Prior to Admission medications   Medication Sig Start Date End Date Taking? Authorizing Provider  albuterol  (PROVENTIL  HFA;VENTOLIN  HFA) 108 (90 BASE) MCG/ACT inhaler Inhale 1-2 puffs into the lungs every 6 (six) hours as needed for wheezing. Patient taking differently: Inhale 2 puffs into the lungs every 6 (six) hours as needed for wheezing. 08/05/12   Kennieth Peaches, MD  amoxicillin -clavulanate (AUGMENTIN ) 875-125 MG tablet Take 1 tablet by mouth 2 (two) times daily. 08/27/23   Angelia Kelp, PA-C  aspirin EC 81 MG tablet Take 81 mg  by mouth daily.    [provider]  benzonatate  (TESSALON ) 100 MG capsule Take 1-2 capsules (100-200 mg total) by mouth 3 (three) times daily as needed. 08/27/23   Burnette, Jennifer M, PA-C  calcium carbonate (OSCAL) 1500 (600 Ca) MG TABS tablet Take 600 mg of elemental calcium by mouth 2 (two) times daily with a meal.    [provider]  CARDIZEM  LA 360 MG 24 hr tablet Take 360 mg by mouth daily.  05/06/15   [provider]  celecoxib  (CELEBREX ) 200 MG capsule Take 200 mg by mouth 2 (two) times daily. 05/13/23   [provider]  cyclobenzaprine  (FLEXERIL ) 10 MG tablet Take 1 tablet (10 mg total) by mouth 2 (two) times daily as needed for muscle spasms. 12/07/20   Volney Grumbles, PA-C  diclofenac  Sodium (VOLTAREN ) 1 % GEL Apply 2 g topically 4 (four) times daily as needed. 09/07/20   Long, Joshua G, MD  fluconazole  (DIFLUCAN ) 150 MG tablet Take 150 mg by mouth once a week. 07/31/23   [provider]  ibuprofen  (ADVIL ) 600 MG tablet Take 1 tablet (600 mg total) by mouth every 6 (six) hours as needed. 09/01/20   Wynetta Heckle, MD  insulin  glargine (LANTUS ) 100 UNIT/ML injection Inject 10 Units into the skin at bedtime as needed (blood sugar over 280).  02/20/11 08/24/23  Towanda Fret, MD  JANUVIA 100 MG tablet Take 100 mg by mouth daily. 05/06/15   [provider]  lisinopril-hydrochlorothiazide (ZESTORETIC) 10-12.5 MG tablet Take 1 tablet by mouth daily. 07/31/23   [provider]  MOUNJARO 10 MG/0.5ML Pen Inject 10 mg into the skin once a week. 07/19/23   [provider]  Multiple Vitamin (MULTIVITAMIN WITH MINERALS) TABS tablet Take 1 tablet by mouth daily.    [provider]  oxyCODONE -acetaminophen  (PERCOCET) 10-325 MG tablet Take 1 tablet by mouth every 6 (six) hours as needed for pain.  05/10/15   [provider]  polyethylene glycol (MIRALAX  / GLYCOLAX ) packet Take 17 g by mouth daily as needed for mild  constipation.    [provider]  vitamin B-12 (CYANOCOBALAMIN) 1000 MCG tablet Take 2,000 mcg by mouth daily.    [provider]    Physical Exam: Vitals:   09/10/23 1942 09/10/23 2230 09/10/23 2245 09/10/23 2300  BP: (!) 140/60 (!) 115/57  (!) 124/58  Pulse: (!) 56 61 (!) 58 (!) 57  Resp: 17 12 12 13   Temp: (!) 97.5 F (36.4 C)     TempSrc: Oral     SpO2: 100%  98% 98%  Weight:      Height:       Neurology awake, but keeps eyes closed, responds to simple questions and follows commands, not very communicative. No apparent focal neurologic deficit ENT with mild pallor with no icterus Cardiovascular with S1 and S2 present with  no gallops, rubs or murmurs Respiratory with no rales or wheezing, no rhonchi  Abdomen with no distention, tender to palpation with no guarding or rebound, soft to palpation, with decreased bowel sounds No lower extremity edema   Data Reviewed: {Tip this will not be part of the note when signed- Document your independent interpretation of telemetry tracing, EKG, lab, Radiology test or any other diagnostic tests. Add any new diagnostic test ordered today. (Optional):26781  Na 131, K 4.0 Cl 103 bicarbonate 16 glucose 229 bun 34 cr 1,23  AST 24 ALT 20  High sensitive troponin 4 and 6  Wbc 13.0 hgb 12. Plt 168   CT abdomen and pelvis with fluid filled loops in the lower abdomen and pelvis at the upper limits of normal in caliber measuring up to 3.1 cm in diameter. There is abrupt tapering in the right lower quadrant with mild upstream fecalization. Findings compatible with early or partial small bowel obstruction.  Periportal, mesenteric, and body wall edema.   EKG 56 bpm, normal axis, normal intervals qtc 435, sinus rhythm with no significant ST segment changes, negative T wave V1 to V3.   Assessment and Plan: * SBO (small bowel obstruction) (HCC) Continue supportive medical therapy with IV fluids, isotonic saline with dextrose .  As needed  analgesics and antiemetics Will keep NPO and add antiacids.  Follow up with surgery recommendations   AKI (acute kidney injury) (HCC) Hyponatremia.  Pre renal renal failure Continue IV fluids with isotonic saline, at 100 ml per hr Follow up renal function and electrolytes in am Avoid hypotension and nephrotoxic medications   HYPERTENSION Hold on antihypertensive agents for now due to risk of hypotension  At home she is on lisinopril  and hydrochlorothiazide .  Hold on diltiazem  due to bradycardia.   Asthma No signs of acute exacerbation, continue with as needed albuterol .   Type 2 diabetes mellitus (HCC) Uncontrolled hyperglycemia.  Will continue glucose cover and monitoring with insulin  sliding scale.  Hold basal insulin  for now, patient is NPO.   Anxiety Continue inpatient monitoring,   Advance Care Planning:   Code Status: Full Code   Consults: surgery per ED   Family Communication: daughter at the bedside   Severity of Illness: The appropriate patient status for this patient is INPATIENT. Inpatient status is judged to be reasonable and necessary in order to provide the required intensity of service to ensure the patient's safety. The patient's presenting symptoms, physical exam findings, and initial radiographic and laboratory data in the context of their chronic comorbidities is felt to place them at high risk for further clinical deterioration. Furthermore, it is not anticipated that the patient will be medically stable for discharge from the hospital within 2 midnights of admission.   * I certify that at the point of admission it is my clinical judgment that the patient will require inpatient hospital care spanning beyond 2 midnights from the point of admission due to high intensity of service, high risk for further deterioration and high frequency of surveillance required.*  Author: Albertus Alt, MD 09/10/2023 11:04 PM  For on call review [email or web address].

## 2023-09-10 NOTE — Assessment & Plan Note (Signed)
 Hyponatremia.  Pre renal renal failure Continue IV fluids with isotonic saline, at 100 ml per hr Follow up renal function and electrolytes in am Avoid hypotension and nephrotoxic medications

## 2023-09-10 NOTE — ED Notes (Signed)
 Admitting at bedside

## 2023-09-10 NOTE — ED Triage Notes (Signed)
 Pt called out due to near syncope. When EMS arrived, pt's HR was in the 40's. EMS gave 1 mg of atropine nd pt's HR now in the 60's

## 2023-09-10 NOTE — Assessment & Plan Note (Signed)
 No signs of acute exacerbation, continue with as needed albuterol .

## 2023-09-10 NOTE — Assessment & Plan Note (Signed)
 Uncontrolled hyperglycemia.  Will continue glucose cover and monitoring with insulin  sliding scale.  Hold basal insulin  for now, patient is NPO.

## 2023-09-10 NOTE — ED Notes (Addendum)
 Pt ambulated with the RN and NT to the bathroom. Pt had diarrhea and decided to throw their dress and bra away. Pt assisted with getting cleaned up and placed in a hospital gown. Pt ambulated back to their room and resting comfortably in bed.

## 2023-09-10 NOTE — Assessment & Plan Note (Signed)
 Continue supportive medical therapy with IV fluids, isotonic saline with dextrose .  As needed analgesics and antiemetics Will keep NPO and add antiacids.  Follow up with surgery recommendations

## 2023-09-11 ENCOUNTER — Inpatient Hospital Stay (HOSPITAL_COMMUNITY)

## 2023-09-11 DIAGNOSIS — K4090 Unilateral inguinal hernia, without obstruction or gangrene, not specified as recurrent: Secondary | ICD-10-CM

## 2023-09-11 DIAGNOSIS — K56609 Unspecified intestinal obstruction, unspecified as to partial versus complete obstruction: Secondary | ICD-10-CM

## 2023-09-11 LAB — BASIC METABOLIC PANEL WITH GFR
Anion gap: 6 (ref 5–15)
BUN: 21 mg/dL (ref 8–23)
CO2: 22 mmol/L (ref 22–32)
Calcium: 8.1 mg/dL — ABNORMAL LOW (ref 8.9–10.3)
Chloride: 106 mmol/L (ref 98–111)
Creatinine, Ser: 0.74 mg/dL (ref 0.44–1.00)
GFR, Estimated: >60 mL/min (ref >60)
Glucose, Bld: 147 mg/dL — ABNORMAL HIGH (ref 70–99)
Potassium: 4 mmol/L (ref 3.5–5.1)
Sodium: 134 mmol/L — ABNORMAL LOW (ref 135–145)

## 2023-09-11 LAB — GLUCOSE, CAPILLARY
Glucose-Capillary: 132 mg/dL — ABNORMAL HIGH (ref 70–99)
Glucose-Capillary: 98 mg/dL (ref 70–99)
Glucose-Capillary: 89 mg/dL (ref 70–99)
Glucose-Capillary: 101 mg/dL — ABNORMAL HIGH (ref 70–99)

## 2023-09-11 LAB — HEMOGLOBIN A1C
Hgb A1c MFr Bld: 6.2 % — ABNORMAL HIGH (ref 4.8–5.6)
Mean Plasma Glucose: 131.24 mg/dL

## 2023-09-11 LAB — HIV ANTIBODY (ROUTINE TESTING W REFLEX): HIV Screen 4th Generation wRfx: NONREACTIVE

## 2023-09-11 LAB — CBC
HCT: 31.4 % — ABNORMAL LOW (ref 36.0–46.0)
Hemoglobin: 10.3 g/dL — ABNORMAL LOW (ref 12.0–15.0)
MCH: 30.7 pg (ref 26.0–34.0)
MCHC: 32.8 g/dL (ref 30.0–36.0)
MCV: 93.5 fL (ref 80.0–100.0)
Platelets: 158 10*3/uL (ref 150–400)
RBC: 3.36 MIL/uL — ABNORMAL LOW (ref 3.87–5.11)
RDW: 13.1 % (ref 11.5–15.5)
WBC: 5.1 10*3/uL (ref 4.0–10.5)
nRBC: 0 % (ref 0.0–0.2)

## 2023-09-11 LAB — BRAIN NATRIURETIC PEPTIDE: B Natriuretic Peptide: 507 pg/mL — ABNORMAL HIGH (ref 0.0–100.0)

## 2023-09-11 MED ORDER — BOOST / RESOURCE BREEZE PO LIQD CUSTOM
1.0000 | Freq: Three times a day (TID) | ORAL | Status: DC
  Administered 2023-09-11 – 2023-09-14 (×10): 1 via ORAL

## 2023-09-11 MED ORDER — STERILE WATER FOR INJECTION IJ SOLN: 10.0000 mL | Freq: Once | INTRAMUSCULAR | Status: AC

## 2023-09-11 MED ORDER — OXYCODONE HCL 5 MG PO TABS
5.0000 mg | ORAL_TABLET | ORAL | Status: DC | PRN
  Administered 2023-09-11 – 2023-09-14 (×11): 5 mg via ORAL
  Filled 2023-09-11 (×11): qty 1

## 2023-09-11 NOTE — Consult Note (Signed)
 [facility] Consult  Reason for Consult: SBO Referring Physician: Dr. Jeannie Milo ED   Chief Complaint   Near Syncope     HPI: Lisa Todd is a 68 y.o. female with concern for early SBO on CT scan done in the ED. She has a history of DM, depression, HLD, GERD and had a reduction of an inguinal hernia 5/8 in the ED. Based on the ED notes, Dr. Larrie Po evaluated her and said she was appropriate for dc home. She had some reported mild pain after that reduction. Yesterday, she had new onset of severe abdominal pain and nausea/vomiting and came to the ED. CT scan done in the ED demonstrated signs of early SBO. No reported Bms to the admitting doctor.   She has since had 3 Bms documented. The ED did not place an NG as she had not vomited.  She has appointment with Dr. Larrie Po for 6/5. Says since her ED visits 5/6 and 5/8 she has not been having her normal regular AM Bms. She has been more sporadic and it has been watery.   Past Medical History:  Diagnosis Date   Arthritis    Asthma    Chronic back pain    Depression    Diabetes mellitus    hasn't take insulin  since weight loss 2020   Difficult intravenous access    required ultrasound for placement   GERD (gastroesophageal reflux disease)    hx of    Hyperlipidemia    Hypertension    Obesity    Sleep apnea    uses O2 2 liters  / currently has no cpap     Past Surgical History:  Procedure Laterality Date   ABDOMINAL HYSTERECTOMY     BLADDER REPAIR W/ CESAREAN SECTION  1989   CATARACT EXTRACTION W/PHACO Left 08/28/2019   Procedure: CATARACT EXTRACTION PHACO AND INTRAOCULAR LENS PLACEMENT (IOC) CDE: 18.46;  Surgeon: Tarri Farm, MD;  Location: [facility];  Service: Ophthalmology;  Laterality: Left;   CATARACT EXTRACTION W/PHACO Right 10/02/2019   Procedure: CATARACT EXTRACTION PHACO AND INTRAOCULAR LENS PLACEMENT (IOC);  Surgeon: Tarri Farm, MD;  Location: [facility];  Service: Ophthalmology;  Laterality: Right;  CDE:  8.13   GASTRIC ROUX-EN-Y N/A 06/24/2015   Procedure: LAPAROSCOPIC ROUX-EN-Y GASTRIC BYPASS WITH UPPER ENDOSCOPY;  Surgeon: Ayesha Lente, MD;  Location: [facility];  Service: General;  Laterality: N/A;   knee left arthroscopy  2004   PANNICULECTOMY Bilateral 09/04/2016   Procedure: PANNICULECTOMY;  Surgeon: Ayesha Lente, MD;  Location: [facility];  Service: General;  Laterality: Bilateral;   right knee surgery  2007   right rotator cuff surgery  11/26/2009   VESICOVAGINAL FISTULA CLOSURE W/ TAH  2002    Family History  Problem Relation Age of Onset   Breast cancer Mother    Diabetes Mother    Hypertension Mother    Asthma Father    Diabetes Father    Hypertension Father    Diabetes Brother    Hypertension Brother    Diabetes Sister    Hypertension Sister     Social History   Tobacco Use   Smoking status: Never   Smokeless tobacco: Never  Vaping Use   Vaping status: Never Used  Substance Use Topics   Alcohol use: Yes    Comment: once a month   Drug use: Never    Medications: I have reviewed the patient's current medications. Prior to Admission:  Medications Prior to Admission  Medication Sig Dispense Refill Last Dose/Taking  albuterol  (PROVENTIL  HFA;VENTOLIN  HFA) 108 (90 BASE) MCG/ACT inhaler Inhale 1-2 puffs into the lungs every 6 (six) hours as needed for wheezing. (Patient taking differently: Inhale 2 puffs into the lungs every 6 (six) hours as needed for wheezing.) 1 Inhaler 0    amoxicillin -clavulanate (AUGMENTIN ) 875-125 MG tablet Take 1 tablet by mouth 2 (two) times daily. 20 tablet 0    aspirin EC 81 MG tablet Take 81 mg by mouth daily.      benzonatate  (TESSALON ) 100 MG capsule Take 1-2 capsules (100-200 mg total) by mouth 3 (three) times daily as needed. 30 capsule 0    calcium carbonate (OSCAL) 1500 (600 Ca) MG TABS tablet Take 600 mg of elemental calcium by mouth 2 (two) times daily with a meal.      CARDIZEM  LA 360 MG 24 hr tablet Take 360 mg by mouth daily.        celecoxib  (CELEBREX ) 200 MG capsule Take 200 mg by mouth 2 (two) times daily.      cyclobenzaprine  (FLEXERIL ) 10 MG tablet Take 1 tablet (10 mg total) by mouth 2 (two) times daily as needed for muscle spasms. 20 tablet 0    diclofenac  Sodium (VOLTAREN ) 1 % GEL Apply 2 g topically 4 (four) times daily as needed. 50 g 0    fluconazole  (DIFLUCAN ) 150 MG tablet Take 150 mg by mouth once a week.      ibuprofen  (ADVIL ) 600 MG tablet Take 1 tablet (600 mg total) by mouth every 6 (six) hours as needed. 15 tablet 0    insulin  glargine (LANTUS ) 100 UNIT/ML injection Inject 10 Units into the skin at bedtime as needed (blood sugar over 280).       JANUVIA 100 MG tablet Take 100 mg by mouth daily.      lisinopril-hydrochlorothiazide (ZESTORETIC) 10-12.5 MG tablet Take 1 tablet by mouth daily.      MOUNJARO 10 MG/0.5ML Pen Inject 10 mg into the skin once a week.      Multiple Vitamin (MULTIVITAMIN WITH MINERALS) TABS tablet Take 1 tablet by mouth daily.      oxyCODONE -acetaminophen  (PERCOCET) 10-325 MG tablet Take 1 tablet by mouth every 6 (six) hours as needed for pain.       polyethylene glycol (MIRALAX  / GLYCOLAX ) packet Take 17 g by mouth daily as needed for mild constipation.      vitamin B-12 (CYANOCOBALAMIN) 1000 MCG tablet Take 2,000 mcg by mouth daily.      Scheduled:  enoxaparin  (LOVENOX ) injection  40 mg Subcutaneous Q24H   insulin  aspart  0-9 Units Subcutaneous TID WC   multivitamin with minerals  1 tablet Oral Daily   pantoprazole  (PROTONIX ) IV  40 mg Intravenous Q12H   Continuous:  sodium chloride  Stopped (09/11/23 0554)   PRN:acetaminophen  **OR** acetaminophen , cyclobenzaprine , morphine  injection, ondansetron  **OR** ondansetron  (ZOFRAN ) IV, polyethylene glycol  Allergies  Allergen Reactions   Liraglutide Hives and Other (See Comments)    Sweating      ROS:  A comprehensive review of systems was negative except for: Gastrointestinal: positive for abdominal pain and  nausea  Blood pressure (!) 136/51, pulse 61, temperature 98 F (36.7 C), temperature source Oral, resp. rate 18, height 5\' 3"  (1.6 m), weight 68.8 kg, SpO2 94%. Physical Exam Vitals reviewed.  HENT:     Head: Normocephalic.     Nose: Nose normal.  Eyes:     Extraocular Movements: Extraocular movements intact.  Cardiovascular:     Rate and Rhythm: Normal rate.  Pulmonary:  Effort: Pulmonary effort is normal.  Abdominal:     General: There is no distension.     Palpations: Abdomen is soft.     Tenderness: There is abdominal tenderness in the right lower quadrant.     Hernia: A hernia is present. Hernia is present in the right inguinal area.     Comments: Fluid in hernia on right, mildly tender, no umbilicus s/p panniculectomy   Musculoskeletal:        General: No swelling.  Skin:    General: Skin is warm.  Neurological:     General: No focal deficit present.     Mental Status: She is alert.  Psychiatric:        Mood and Affect: Mood normal.     Results: Results for orders placed or performed during the hospital encounter of 09/10/23 (from the past 48 hours)  Comprehensive metabolic panel     Status: Abnormal   Collection Time: 09/10/23  7:26 PM  Result Value Ref Range   Sodium 131 (L) 135 - 145 mmol/L   Potassium 4.0 3.5 - 5.1 mmol/L   Chloride 103 98 - 111 mmol/L   CO2 16 (L) 22 - 32 mmol/L   Glucose, Bld 229 (H) 70 - 99 mg/dL    Comment: Glucose reference range applies only to samples taken after fasting for at least 8 hours.   BUN 34 (H) 8 - 23 mg/dL   Creatinine, Ser 1.61 (H) 0.44 - 1.00 mg/dL   Calcium 8.5 (L) 8.9 - 10.3 mg/dL   Total Protein 7.2 6.5 - 8.1 g/dL   Albumin 3.8 3.5 - 5.0 g/dL   AST 24 15 - 41 U/L   ALT 20 0 - 44 U/L   Alkaline Phosphatase 52 38 - 126 U/L   Total Bilirubin 0.7 0.0 - 1.2 mg/dL   GFR, Estimated 48 (L) >60 mL/min    Comment: (NOTE) Calculated using the CKD-EPI Creatinine Equation (2021)    Anion gap 12 5 - 15    Comment:  Performed at [facility], [address], [place], [place] [address]  Troponin I (High Sensitivity)     Status: None   Collection Time: 09/10/23  7:26 PM  Result Value Ref Range   Troponin I (High Sensitivity) 4 <18 ng/L    Comment: (NOTE) Elevated high sensitivity troponin I (hsTnI) values and significant  changes across serial measurements may suggest ACS but many other  chronic and acute conditions are known to elevate hsTnI results.  Refer to the "Links" section for chest pain algorithms and additional  guidance. Performed at [facility], [address] [place], [place] [address]   CBC with Differential     Status: Abnormal   Collection Time: 09/10/23  7:26 PM  Result Value Ref Range   WBC 13.0 (H) 4.0 - 10.5 K/uL   RBC 4.00 3.87 - 5.11 MIL/uL   Hemoglobin 12.0 12.0 - 15.0 g/dL   HCT 11.9 14.7 - 82.9 %   MCV 93.3 80.0 - 100.0 fL   MCH 30.0 26.0 - 34.0 pg   MCHC 32.2 30.0 - 36.0 g/dL   RDW 56.2 13.0 - 86.5 %   Platelets 168 150 - 400 K/uL   nRBC 0.0 0.0 - 0.2 %   Neutrophils Relative % 90 %   Neutro Abs 11.7 (H) 1.7 - 7.7 K/uL   Lymphocytes Relative 4 %   Lymphs Abs 0.6 (L) 0.7 - 4.0 K/uL   Monocytes Relative 6 %   Monocytes  Absolute 0.7 0.1 - 1.0 K/uL   Eosinophils Relative 0 %   Eosinophils Absolute 0.0 0.0 - 0.5 K/uL   Basophils Relative 0 %   Basophils Absolute 0.0 0.0 - 0.1 K/uL   Immature Granulocytes 0 %   Abs Immature Granulocytes 0.04 0.00 - 0.07 K/uL    Comment: Performed at [facility], [address], [place], [place] [address]  Troponin I (High Sensitivity)     Status: None   Collection Time: 09/10/23 10:35 PM  Result Value Ref Range   Troponin I (High Sensitivity) 6 <18 ng/L    Comment: (NOTE) Elevated high sensitivity troponin I (hsTnI) values and significant  changes across serial measurements may suggest ACS but many other  chronic and acute conditions are known to elevate hsTnI results.  Refer to the "Links" section for chest pain algorithms and  additional  guidance. Performed at [facility], [address] [place], [place] [address]   Glucose, capillary     Status: Abnormal   Collection Time: 09/11/23  7:34 AM  Result Value Ref Range   Glucose-Capillary 132 (H) 70 - 99 mg/dL    Comment: Glucose reference range applies only to samples taken after fasting for at least 8 hours.   Personally reviewed an showed patient and family- looked at old skins too - area of small bowel with narrowing, corresponds with the area that was likely incarcerated, fecalized small bowel proximal  CT ABDOMEN PELVIS W CONTRAST Result Date: 09/10/2023 CLINICAL DATA:  Right lower quadrant abdominal pain. Near syncope. Bradycardia. EXAM: CT ABDOMEN AND PELVIS WITH CONTRAST TECHNIQUE: Multidetector CT imaging of the abdomen and pelvis was performed using the standard protocol following bolus administration of intravenous contrast. RADIATION DOSE REDUCTION: This exam was performed according to the departmental dose-optimization program which includes automated exposure control, adjustment of the mA and/or kV according to patient size and/or use of iterative reconstruction technique. CONTRAST:  OMNIPAQUE  IOHEXOL  300 MG/ML  SOLN COMPARISON:  CT abdomen pelvis 08/26/2023 FINDINGS: Lower chest: No acute abnormality. Hepatobiliary: No focal hepatic lesion. Mild periportal edema. Gallbladder and biliary tree are unremarkable. Pancreas: Unchanged dilation of the pancreatic duct in the pancreatic head measuring 7 mm. No evidence of acute pancreatitis. Spleen: Unremarkable. Adrenals/Urinary Tract: Stable adrenal glands. No urinary calculi or hydronephrosis. Unremarkable bladder. Stomach/Bowel: Fluid-filled loops of small bowel in the lower abdomen and pelvis at the upper limits of normal in caliber measuring up to 3.1 cm in diameter. There is abrupt tapering in the right lower quadrant with mild upstream fecalization (circa series 4/image 38). No bowel wall thickening.  Postoperative change of gastric bypass. Appendix is normal. Vascular/Lymphatic: Aortic atherosclerosis. No enlarged abdominal or pelvic lymph nodes. Reproductive: Hysterectomy.  No adnexal mass. Other: Right inguinal hernia containing low-density fluid. Mesenteric and body wall edema. Musculoskeletal: No acute fracture. IMPRESSION: 1. Fluid-filled loops of small bowel in the lower abdomen and pelvis at the upper limits of normal in caliber measuring up to 3.1 cm in diameter. There is abrupt tapering in the right lower quadrant with mild upstream fecalization. Findings are compatible with early or partial small bowel obstruction. 2. Periportal, mesenteric, and body wall edema. Correlate with cardiac function and fluid status. 3. Aortic Atherosclerosis (ICD10-I70.0). Electronically Signed   By: Rozell Cornet M.D.   On: 09/10/2023 21:28     Assessment & Plan:  DESHONNA TRNKA is a 68 y.o. female with partial SBO that is resolving but sounds like she has been having issues since her incarcerated  hernia and reduction. I am afraid that despite her reduction she has some sequelae to the small bowel with some inflammation or even narrowing/ stricture given the appearance on the CT scan.   -Clear diet today -Will see how she does -I feel like she may need CT with oral contrast to further evaluate the area and determine if this area is narrowed down, discussed that this could just be temporary but if it scars it could be narrowed permanently and may need removal  -Discussed that we will she has she does and go from there   All questions were answered to the satisfaction of the patient and family.  Update team.   Awilda Bogus 09/11/2023, 8:23 AM

## 2023-09-11 NOTE — Plan of Care (Signed)

## 2023-09-11 NOTE — Progress Notes (Addendum)
 PROGRESS NOTE  Lisa Todd ZOX:[id number] DOB: 22-Jul-1955 DOA: 09/10/2023 PCP: Narda Bacon, MD  HPI/Recap of past 24 hours: Lisa Todd is a 68 y.o. female with medical history significant of asthma, T2DM, depression, hyperlipidemia, and GERD presented with worsening abdominal pain, nausea and vomiting.  Of note, on 05/08 presented to the ED for right inguinal hernia, was manually reduced and patient was discharged home.  Due to this new onset of severe abdominal pain, EMS was called, found to be bradycardic in the 40s, received atropine and was transported to the ED. in the ED, vitals fairly stable except for heart rates in the 50s.  Labs showed creatinine of 1.23, sodium 131, troponin 4, WBC 13.  EKG with no acute ST changes.  CT abdomen/pelvis with findings compatible with early or partial SBO, noted body wall edema.  Patient admitted for further management.    Today, patient attempted clear liquid diets, of which she is currently tolerating, denies any further nausea/vomiting, still with some abdominal pain.  Had couple of bowel movements.  Daughter at bedside.  Noted to have bilateral lower extremity edema.     Assessment/Plan: Principal Problem:   SBO (small bowel obstruction) (HCC) Active Problems:   AKI (acute kidney injury) (HCC)   HYPERTENSION   Asthma   Type 2 diabetes mellitus (HCC)   Anxiety   SBO (small bowel obstruction)  CT abdomen/pelvis with findings compatible with early or partial SBO, noted body wall edema General Surgery consulted, cleared to advance to clear liquid diet, plan for possible repeat imaging due to history of recently reduced inguinal hernia As needed analgesics and antiemetics Supportive care, clear liquid diet  Bilateral lower extremity edema BNP pending Chest x-ray pending  BLE Doppler pending  HTN BP stable Hold home diltiazem , lisinopril, hydrochlorothiazide Monitor BP and restart as tolerated   Asthma No signs of acute  exacerbation Continue with as needed albuterol .    Type 2 diabetes mellitus Last A1c on 08/2023-->6.2 SSI, Accu-Cheks, hypoglycemic protocol     Estimated body mass index is 26.87 kg/m as calculated from the following:   Height as of this encounter: 5\' 3"  (1.6 m).   Weight as of this encounter: 68.8 kg.     Code Status: Full  Family Communication: Discussed with daughter at bedside  Disposition Plan: Status is: Inpatient Remains inpatient appropriate because: Level of care      Consultants: General Surgery  Procedures: None  Antimicrobials: None  DVT prophylaxis: Lovenox    Objective: Vitals:   09/11/23 0545 09/11/23 0545 09/11/23 0615 09/11/23 0620  BP: (!) 110/51  (!) 136/51   Pulse: 67  61   Resp: 13  18   Temp:  98.5 F (36.9 C) 98 F (36.7 C)   TempSrc:  Oral Oral   SpO2: 98%  94%   Weight:    68.8 kg  Height:   5\' 3"  (1.6 m)     Intake/Output Summary (Last 24 hours) at 09/11/2023 1021 Last data filed at 09/11/2023 0900 Gross per 24 hour  Intake 1777.16 ml  Output 725 ml  Net 1052.16 ml   Filed Weights   09/10/23 1820 09/11/23 0620  Weight: 68 kg 68.8 kg    Exam: General: NAD  Cardiovascular: S1, S2 present Respiratory: CTAB Abdomen: Soft, tender, nondistended, bowel sounds present Musculoskeletal: bilateral pedal edema noted Skin: Normal Psychiatry: Normal mood   Data Reviewed: CBC: Recent Labs  Lab 09/10/23 1926 09/11/23 0818  WBC 13.0* 5.1  NEUTROABS 11.7*  --  HGB 12.0 10.3*  HCT 37.3 31.4*  MCV 93.3 93.5  PLT 168 158   Basic Metabolic Panel: Recent Labs  Lab 09/10/23 1926 09/11/23 0818  NA 131* 134*  K 4.0 4.0  CL 103 106  CO2 16* 22  GLUCOSE 229* 147*  BUN 34* 21  CREATININE 1.23* 0.74  CALCIUM 8.5* 8.1*   GFR: Estimated Creatinine Clearance: 64.4 mL/min (by C-G formula based on SCr of 0.74 mg/dL). Liver Function Tests: Recent Labs  Lab 09/10/23 1926  AST 24  ALT 20  ALKPHOS 52  BILITOT 0.7  PROT  7.2  ALBUMIN 3.8   No results for input(s): "LIPASE", "AMYLASE" in the last 168 hours. No results for input(s): "AMMONIA" in the last 168 hours. Coagulation Profile: No results for input(s): "INR", "PROTIME" in the last 168 hours. Cardiac Enzymes: No results for input(s): "CKTOTAL", "CKMB", "CKMBINDEX", "TROPONINI" in the last 168 hours. BNP (last 3 results) No results for input(s): "PROBNP" in the last 8760 hours. HbA1C: No results for input(s): "HGBA1C" in the last 72 hours. CBG: Recent Labs  Lab 09/11/23 0734  GLUCAP 132*   Lipid Profile: No results for input(s): "CHOL", "HDL", "LDLCALC", "TRIG", "CHOLHDL", "LDLDIRECT" in the last 72 hours. Thyroid Function Tests: No results for input(s): "TSH", "T4TOTAL", "FREET4", "T3FREE", "THYROIDAB" in the last 72 hours. Anemia Panel: No results for input(s): "VITAMINB12", "FOLATE", "FERRITIN", "TIBC", "IRON", "RETICCTPCT" in the last 72 hours. Urine analysis:    Component Value Date/Time   COLORURINE YELLOW 08/26/2023 0742   APPEARANCEUR CLEAR 08/26/2023 0742   LABSPEC 1.018 08/26/2023 0742   PHURINE 6.0 08/26/2023 0742   GLUCOSEU NEGATIVE 08/26/2023 0742   HGBUR NEGATIVE 08/26/2023 0742   HGBUR negative 07/01/2010 1002   BILIRUBINUR NEGATIVE 08/26/2023 0742   BILIRUBINUR negative 07/27/2023 1455   BILIRUBINUR neg 12/17/2011 1451   KETONESUR NEGATIVE 08/26/2023 0742   PROTEINUR NEGATIVE 08/26/2023 0742   UROBILINOGEN 1.0 07/27/2023 1455   UROBILINOGEN 0.2 04/09/2013 0749   NITRITE NEGATIVE 08/26/2023 0742   LEUKOCYTESUR NEGATIVE 08/26/2023 0742   Sepsis Labs: @LABRCNTIP (procalcitonin:4,lacticidven:4)  )No results found for this or any previous visit (from the past 240 hours).    Studies: CT ABDOMEN PELVIS W CONTRAST Result Date: 09/10/2023 CLINICAL DATA:  Right lower quadrant abdominal pain. Near syncope. Bradycardia. EXAM: CT ABDOMEN AND PELVIS WITH CONTRAST TECHNIQUE: Multidetector CT imaging of the abdomen and pelvis  was performed using the standard protocol following bolus administration of intravenous contrast. RADIATION DOSE REDUCTION: This exam was performed according to the departmental dose-optimization program which includes automated exposure control, adjustment of the mA and/or kV according to patient size and/or use of iterative reconstruction technique. CONTRAST:  OMNIPAQUE  IOHEXOL  300 MG/ML  SOLN COMPARISON:  CT abdomen pelvis 08/26/2023 FINDINGS: Lower chest: No acute abnormality. Hepatobiliary: No focal hepatic lesion. Mild periportal edema. Gallbladder and biliary tree are unremarkable. Pancreas: Unchanged dilation of the pancreatic duct in the pancreatic head measuring 7 mm. No evidence of acute pancreatitis. Spleen: Unremarkable. Adrenals/Urinary Tract: Stable adrenal glands. No urinary calculi or hydronephrosis. Unremarkable bladder. Stomach/Bowel: Fluid-filled loops of small bowel in the lower abdomen and pelvis at the upper limits of normal in caliber measuring up to 3.1 cm in diameter. There is abrupt tapering in the right lower quadrant with mild upstream fecalization (circa series 4/image 38). No bowel wall thickening. Postoperative change of gastric bypass. Appendix is normal. Vascular/Lymphatic: Aortic atherosclerosis. No enlarged abdominal or pelvic lymph nodes. Reproductive: Hysterectomy.  No adnexal mass. Other: Right inguinal hernia containing low-density  fluid. Mesenteric and body wall edema. Musculoskeletal: No acute fracture. IMPRESSION: 1. Fluid-filled loops of small bowel in the lower abdomen and pelvis at the upper limits of normal in caliber measuring up to 3.1 cm in diameter. There is abrupt tapering in the right lower quadrant with mild upstream fecalization. Findings are compatible with early or partial small bowel obstruction. 2. Periportal, mesenteric, and body wall edema. Correlate with cardiac function and fluid status. 3. Aortic Atherosclerosis (ICD10-I70.0). Electronically  Signed   By: Rozell Cornet M.D.   On: 09/10/2023 21:28    Scheduled Meds:  enoxaparin  (LOVENOX ) injection  40 mg Subcutaneous Q24H   feeding supplement  1 Container Oral TID BM   insulin  aspart  0-9 Units Subcutaneous TID WC   multivitamin with minerals  1 tablet Oral Daily   pantoprazole  (PROTONIX ) IV  40 mg Intravenous Q12H    Continuous Infusions:  sodium chloride  Stopped (09/11/23 0554)     LOS: 1 day     Veronica Gordon, MD [facility]  If 7PM-7AM, please contact night-coverage [email or web address] 09/11/2023, 10:21 AM

## 2023-09-11 NOTE — ED Notes (Signed)
 Pt assisted to bedside commode

## 2023-09-11 NOTE — Progress Notes (Signed)
   09/11/23 1817  TOC Brief Assessment  Insurance and Status Reviewed  Patient has primary care physician Yes  Home environment has been reviewed Single Family Home  Prior level of function: Independent  Prior/Current Home Services No current home services  Social Drivers of Health Review SDOH reviewed no interventions necessary  Readmission risk has been reviewed Yes  Transition of care needs no transition of care needs at this time   Admitted SBO small bowel obstruction. Transition of Care Department [facility]) has reviewed patient, and no TOC needs have been identified at this time. We will continue to monitor patient advancement through interdisciplinary progressions rounds. If new patient transition needs arise, please place a TOC consult.
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
## 2023-09-12 ENCOUNTER — Inpatient Hospital Stay (HOSPITAL_COMMUNITY)

## 2023-09-12 DIAGNOSIS — K56609 Unspecified intestinal obstruction, unspecified as to partial versus complete obstruction: Secondary | ICD-10-CM | POA: Diagnosis not present

## 2023-09-12 DIAGNOSIS — R0609 Other forms of dyspnea: Secondary | ICD-10-CM | POA: Diagnosis not present

## 2023-09-12 LAB — URINALYSIS, ROUTINE W REFLEX MICROSCOPIC
Bacteria, UA: NONE SEEN
Bilirubin Urine: NEGATIVE
Glucose, UA: NEGATIVE mg/dL
Ketones, ur: NEGATIVE mg/dL
Nitrite: NEGATIVE
Protein, ur: NEGATIVE mg/dL
Specific Gravity, Urine: 1.017 (ref 1.005–1.030)
pH: 7 (ref 5.0–8.0)

## 2023-09-12 LAB — BASIC METABOLIC PANEL WITH GFR
Anion gap: 7 (ref 5–15)
BUN: 10 mg/dL (ref 8–23)
CO2: 23 mmol/L (ref 22–32)
Calcium: 7.9 mg/dL — ABNORMAL LOW (ref 8.9–10.3)
Chloride: 109 mmol/L (ref 98–111)
Creatinine, Ser: 0.59 mg/dL (ref 0.44–1.00)
GFR, Estimated: >60 mL/min (ref >60)
Glucose, Bld: 89 mg/dL (ref 70–99)
Potassium: 3.6 mmol/L (ref 3.5–5.1)
Sodium: 139 mmol/L (ref 135–145)

## 2023-09-12 LAB — GLUCOSE, CAPILLARY
Glucose-Capillary: 88 mg/dL (ref 70–99)
Glucose-Capillary: 89 mg/dL (ref 70–99)
Glucose-Capillary: 84 mg/dL (ref 70–99)
Glucose-Capillary: 92 mg/dL (ref 70–99)

## 2023-09-12 LAB — CBC
HCT: 29 % — ABNORMAL LOW (ref 36.0–46.0)
Hemoglobin: 9.3 g/dL — ABNORMAL LOW (ref 12.0–15.0)
MCH: 30.1 pg (ref 26.0–34.0)
MCHC: 32.1 g/dL (ref 30.0–36.0)
MCV: 93.9 fL (ref 80.0–100.0)
Platelets: 130 10*3/uL — ABNORMAL LOW (ref 150–400)
RBC: 3.09 MIL/uL — ABNORMAL LOW (ref 3.87–5.11)
RDW: 13.2 % (ref 11.5–15.5)
WBC: 4.8 10*3/uL (ref 4.0–10.5)
nRBC: 0 % (ref 0.0–0.2)

## 2023-09-12 LAB — ECHOCARDIOGRAM COMPLETE
AR max vel: 2.23 cm2
AV Area VTI: 2.45 cm2
AV Area mean vel: 2.51 cm2
AV Mean grad: 5 mmHg
AV Peak grad: 10.1 mmHg
Ao pk vel: 1.59 m/s
Area-P 1/2: 3.53 cm2
Height: 63 in
S' Lateral: 3.4 cm
Weight: 2426.82 [oz_av]

## 2023-09-12 MED ORDER — POLYETHYLENE GLYCOL 3350 17 G PO PACK
17.0000 g | PACK | Freq: Every day | ORAL | Status: DC
  Administered 2023-09-12 – 2023-09-14 (×3): 17 g via ORAL
  Filled 2023-09-12 (×3): qty 1

## 2023-09-12 MED ORDER — IOHEXOL 9 MG/ML PO SOLN
500.0000 mL | ORAL | Status: AC
  Administered 2023-09-12 (×2): 500 mL via ORAL

## 2023-09-12 MED ORDER — IOHEXOL 300 MG/ML  SOLN
100.0000 mL | Freq: Once | INTRAMUSCULAR | Status: AC | PRN
  Administered 2023-09-12: 100 mL via INTRAVENOUS

## 2023-09-12 NOTE — Progress Notes (Signed)
 [facility]  There is contrast in the colon and the area of small bowel that I'm worried about being narrowed still looks narrowed, but contrast did move through.   We'll see what the radiologist say on the read. Continue clears for now as patient has not had a bowel movement.  Deena Farrier MD

## 2023-09-12 NOTE — Progress Notes (Signed)
 PROGRESS NOTE  [name]:[id number] DOB: 11/10/1955 DOA: 09/10/2023 PCP: Narda Bacon, MD  HPI/Recap of past 24 hours: Lisa Todd is a 68 y.o. female with medical history significant of asthma, T2DM, depression, hyperlipidemia, and GERD presented with worsening abdominal pain, nausea and vomiting.  Of note, on 05/08 presented to the ED for right inguinal hernia, was manually reduced and patient was discharged home.  Due to this new onset of severe abdominal pain, EMS was called, found to be bradycardic in the 40s, received atropine and was transported to the ED. in the ED, vitals fairly stable except for heart rates in the 50s.  Labs showed creatinine of 1.23, sodium 131, troponin 4, WBC 13.  EKG with no acute ST changes.  CT abdomen/pelvis with findings compatible with early or partial SBO, noted body wall edema.  Patient admitted for further management.    Today, pt denies any new complaints, no further BM, passing some gas. Now complaining of dysuria after foley removed.      Assessment/Plan: Principal Problem:   SBO (small bowel obstruction) (HCC) Active Problems:   AKI (acute kidney injury) (HCC)   HYPERTENSION   Asthma   Type 2 diabetes mellitus (HCC)   Anxiety   Inguinal hernia of right side without obstruction or gangrene   SBO (small bowel obstruction)  CT abdomen/pelvis with findings compatible with early or partial SBO, noted body wall edema General Surgery consulted, cleared to advance to clear liquid diet, plan for repeat CT abd/pelvis with contrast As needed analgesics and antiemetics Supportive care, clear liquid diet  Bilateral lower extremity edema BNP 507 Chest x-ray unremarkable ECHO pending BLE Doppler negative  Acute urinary retention  Reports dysuria after foley removed on 5/25 UA/UC pending    HTN BP stable Hold home diltiazem , lisinopril, hydrochlorothiazide Monitor BP and restart as tolerated   Asthma No signs of acute  exacerbation Continue with as needed albuterol .    Type 2 diabetes mellitus Last A1c on 08/2023-->6.2 SSI, Accu-Cheks, hypoglycemic protocol     Estimated body mass index is 26.87 kg/m as calculated from the following:   Height as of this encounter: 5\' 3"  (1.6 m).   Weight as of this encounter: 68.8 kg.     Code Status: Full  Family Communication: None at bedside  Disposition Plan: Status is: Inpatient Remains inpatient appropriate because: Level of care      Consultants: General Surgery  Procedures: None  Antimicrobials: None  DVT prophylaxis: Lovenox    Objective: Vitals:   09/11/23 1500 09/11/23 2010 09/12/23 0359 09/12/23 1450  BP: (!) 109/50 (!) 117/47 (!) 140/67 (!) 152/66  Pulse: 70 71 65 62  Resp: 17 20 18 18   Temp: 99.3 F (37.4 C) 99.8 F (37.7 C) 98.2 F (36.8 C) 98.2 F (36.8 C)  TempSrc: Oral Oral Oral   SpO2: 100% 96% 98% 100%  Weight:      Height:        Intake/Output Summary (Last 24 hours) at 09/12/2023 1547 Last data filed at 09/12/2023 1300 Gross per 24 hour  Intake 1820 ml  Output 2550 ml  Net -730 ml   Filed Weights   09/10/23 1820 09/11/23 0620  Weight: 68 kg 68.8 kg    Exam: General: NAD  Cardiovascular: S1, S2 present Respiratory: CTAB Abdomen: Soft, tender, nondistended, bowel sounds present Musculoskeletal: bilateral pedal edema noted Skin: Normal Psychiatry: Normal mood   Data Reviewed: CBC: Recent Labs  Lab 09/10/23 1926 09/11/23 0818 09/12/23 8119  WBC 13.0* 5.1 4.8  NEUTROABS 11.7*  --   --   HGB 12.0 10.3* 9.3*  HCT 37.3 31.4* 29.0*  MCV 93.3 93.5 93.9  PLT 168 158 130*   Basic Metabolic Panel: Recent Labs  Lab 09/10/23 1926 09/11/23 0818 09/12/23 0551  NA 131* 134* 139  K 4.0 4.0 3.6  CL 103 106 109  CO2 16* 22 23  GLUCOSE 229* 147* 89  BUN 34* 21 10  CREATININE 1.23* 0.74 0.59  CALCIUM 8.5* 8.1* 7.9*   GFR: Estimated Creatinine Clearance: 64.4 mL/min (by C-G formula based on SCr of  0.59 mg/dL). Liver Function Tests: Recent Labs  Lab 09/10/23 1926  AST 24  ALT 20  ALKPHOS 52  BILITOT 0.7  PROT 7.2  ALBUMIN 3.8   No results for input(s): "LIPASE", "AMYLASE" in the last 168 hours. No results for input(s): "AMMONIA" in the last 168 hours. Coagulation Profile: No results for input(s): "INR", "PROTIME" in the last 168 hours. Cardiac Enzymes: No results for input(s): "CKTOTAL", "CKMB", "CKMBINDEX", "TROPONINI" in the last 168 hours. BNP (last 3 results) No results for input(s): "PROBNP" in the last 8760 hours. HbA1C: Recent Labs    09/10/23 1926  HGBA1C 6.2*   CBG: Recent Labs  Lab 09/11/23 1105 09/11/23 1626 09/11/23 2012 09/12/23 0725 09/12/23 1136  GLUCAP 98 89 101* 88 89   Lipid Profile: No results for input(s): "CHOL", "HDL", "LDLCALC", "TRIG", "CHOLHDL", "LDLDIRECT" in the last 72 hours. Thyroid Function Tests: No results for input(s): "TSH", "T4TOTAL", "FREET4", "T3FREE", "THYROIDAB" in the last 72 hours. Anemia Panel: No results for input(s): "VITAMINB12", "FOLATE", "FERRITIN", "TIBC", "IRON", "RETICCTPCT" in the last 72 hours. Urine analysis:    Component Value Date/Time   COLORURINE YELLOW 08/26/2023 0742   APPEARANCEUR CLEAR 08/26/2023 0742   LABSPEC 1.018 08/26/2023 0742   PHURINE 6.0 08/26/2023 0742   GLUCOSEU NEGATIVE 08/26/2023 0742   HGBUR NEGATIVE 08/26/2023 0742   HGBUR negative 07/01/2010 1002   BILIRUBINUR NEGATIVE 08/26/2023 0742   BILIRUBINUR negative 07/27/2023 1455   BILIRUBINUR neg 12/17/2011 1451   KETONESUR NEGATIVE 08/26/2023 0742   PROTEINUR NEGATIVE 08/26/2023 0742   UROBILINOGEN 1.0 07/27/2023 1455   UROBILINOGEN 0.2 04/09/2013 0749   NITRITE NEGATIVE 08/26/2023 0742   LEUKOCYTESUR NEGATIVE 08/26/2023 0742   Sepsis Labs: @LABRCNTIP (procalcitonin:4,lacticidven:4)  )No results found for this or any previous visit (from the past 240 hours).    Studies: US  Venous Img Lower Bilateral (DVT) Result Date:  09/12/2023 CLINICAL DATA:  Lower extremity edema. EXAM: BILATERAL LOWER EXTREMITY VENOUS DOPPLER ULTRASOUND TECHNIQUE: Gray-scale sonography with graded compression, as well as color Doppler and duplex ultrasound were performed to evaluate the lower extremity deep venous systems from the level of the common femoral vein and including the common femoral, femoral, profunda femoral, popliteal and calf veins including the posterior tibial, peroneal and gastrocnemius veins when visible. The superficial great saphenous vein was also interrogated. Spectral Doppler was utilized to evaluate flow at rest and with distal augmentation maneuvers in the common femoral, femoral and popliteal veins. COMPARISON:  12/17/2022 FINDINGS: RIGHT LOWER EXTREMITY Common Femoral Vein: No evidence of thrombus. Normal compressibility, respiratory phasicity and response to augmentation. Saphenofemoral Junction: No evidence of thrombus. Normal compressibility and flow on color Doppler imaging. Profunda Femoral Vein: No evidence of thrombus. Normal compressibility and flow on color Doppler imaging. Femoral Vein: No evidence of thrombus. Normal compressibility, respiratory phasicity and response to augmentation. Popliteal Vein: No evidence of thrombus. Normal compressibility, respiratory phasicity and response to augmentation.  Calf Veins: No evidence of thrombus. Normal compressibility and flow on color Doppler imaging. Superficial Great Saphenous Vein: No evidence of thrombus. Normal compressibility. Venous Reflux:  None. Other Findings: No evidence of superficial thrombophlebitis or abnormal fluid collection. LEFT LOWER EXTREMITY Common Femoral Vein: No evidence of thrombus. Normal compressibility, respiratory phasicity and response to augmentation. Saphenofemoral Junction: No evidence of thrombus. Normal compressibility and flow on color Doppler imaging. Profunda Femoral Vein: No evidence of thrombus. Normal compressibility and flow on color  Doppler imaging. Femoral Vein: No evidence of thrombus. Normal compressibility, respiratory phasicity and response to augmentation. Popliteal Vein: No evidence of thrombus. Normal compressibility, respiratory phasicity and response to augmentation. Calf Veins: No evidence of thrombus. Normal compressibility and flow on color Doppler imaging. Superficial Great Saphenous Vein: No evidence of thrombus. Normal compressibility. Venous Reflux:  None. Other Findings: No evidence of superficial thrombophlebitis or abnormal fluid collection. IMPRESSION: No evidence of deep venous thrombosis in either lower extremity. Electronically Signed   By: Erica Hau M.D.   On: 09/12/2023 10:36    Scheduled Meds:  enoxaparin  (LOVENOX ) injection  40 mg Subcutaneous Q24H   feeding supplement  1 Container Oral TID BM   insulin  aspart  0-9 Units Subcutaneous TID WC   multivitamin with minerals  1 tablet Oral Daily   pantoprazole  (PROTONIX ) IV  40 mg Intravenous Q12H    Continuous Infusions:     LOS: 2 days     Veronica Gordon, MD [facility]  If 7PM-7AM, please contact night-coverage [email or web address] 09/12/2023, 3:47 PM

## 2023-09-12 NOTE — Plan of Care (Signed)

## 2023-09-12 NOTE — Progress Notes (Signed)
 [facility]  No Bms or flatus. Says she is not able to take in much clears. Feels bloated. Still with pain in the RLQ.  BP (!) 140/67 (BP Location: Right Arm)   Pulse 65   Temp 98.2 F (36.8 C) (Oral)   Resp 18   Ht 5\' 3"  (1.6 m)   Wt 68.8 kg   SpO2 98%   BMI 26.87 kg/m  Soft, distention difficult to appropriate due to panniculectomy, tender RLQ  Patient with recent reduction of incarcerated right inguinal hernia and concern for SBO in the area of the bowel that was reduced. Discussed case with Dr. Larrie Po who will see patient tomorrow and plan for CT with oral contrast today, will delay scanning for an extra hour to allow contrast time to reach the intestine.   Clear diet ok CT today CT will help determine best plan for hernia and potential SBR if that is needed.  Dr. Larrie Po rounding tomorrow.   Deena Farrier, MD [facility] [address] Anise Barlow [place], [place] [address] [phone] (office)

## 2023-09-12 NOTE — Progress Notes (Signed)
 [facility]  CT read with no signs of stricture; They do note thickening of the small bowel and edema in the mesentery concerning for enteritis.  This potentially could be sequela from the reduction.  Since no obstruction or stricture will put on full liquid and bowel regimen for tonight.  Deena Farrier, MD

## 2023-09-13 DIAGNOSIS — K56609 Unspecified intestinal obstruction, unspecified as to partial versus complete obstruction: Secondary | ICD-10-CM | POA: Diagnosis not present

## 2023-09-13 LAB — BASIC METABOLIC PANEL WITH GFR
Anion gap: 4 — ABNORMAL LOW (ref 5–15)
BUN: 8 mg/dL (ref 8–23)
CO2: 24 mmol/L (ref 22–32)
Calcium: 7.8 mg/dL — ABNORMAL LOW (ref 8.9–10.3)
Chloride: 107 mmol/L (ref 98–111)
Creatinine, Ser: 0.58 mg/dL (ref 0.44–1.00)
GFR, Estimated: >60 mL/min (ref >60)
Glucose, Bld: 74 mg/dL (ref 70–99)
Potassium: 3.5 mmol/L (ref 3.5–5.1)
Sodium: 135 mmol/L (ref 135–145)

## 2023-09-13 LAB — GLUCOSE, CAPILLARY
Glucose-Capillary: 75 mg/dL (ref 70–99)
Glucose-Capillary: 74 mg/dL (ref 70–99)
Glucose-Capillary: 119 mg/dL — ABNORMAL HIGH (ref 70–99)
Glucose-Capillary: 77 mg/dL (ref 70–99)

## 2023-09-13 MED ORDER — ADULT MULTIVITAMIN W/MINERALS CH
1.0000 | ORAL_TABLET | Freq: Two times a day (BID) | ORAL | Status: DC
  Administered 2023-09-13 – 2023-09-14 (×2): 1 via ORAL
  Filled 2023-09-13 (×2): qty 1

## 2023-09-13 MED ORDER — DICLOFENAC SODIUM 1 % EX GEL
2.0000 g | Freq: Four times a day (QID) | CUTANEOUS | Status: DC | PRN
  Filled 2023-09-13: qty 100

## 2023-09-13 MED ORDER — PHENAZOPYRIDINE HCL 100 MG PO TABS
100.0000 mg | ORAL_TABLET | Freq: Three times a day (TID) | ORAL | Status: AC
  Administered 2023-09-13 – 2023-09-14 (×3): 100 mg via ORAL
  Filled 2023-09-13 (×3): qty 1

## 2023-09-13 MED ORDER — LISINOPRIL 10 MG PO TABS
10.0000 mg | ORAL_TABLET | Freq: Every day | ORAL | Status: DC
  Administered 2023-09-13 – 2023-09-14 (×2): 10 mg via ORAL
  Filled 2023-09-13 (×2): qty 1

## 2023-09-13 MED ORDER — HYDROCHLOROTHIAZIDE 12.5 MG PO TABS
12.5000 mg | ORAL_TABLET | Freq: Every day | ORAL | Status: DC
  Administered 2023-09-13 – 2023-09-14 (×2): 12.5 mg via ORAL
  Filled 2023-09-13 (×2): qty 1

## 2023-09-13 MED ORDER — LIDOCAINE 5 % EX PTCH
2.0000 | MEDICATED_PATCH | Freq: Every day | CUTANEOUS | Status: DC | PRN
  Administered 2023-09-13: 2 via TRANSDERMAL
  Filled 2023-09-13 (×2): qty 2

## 2023-09-13 MED ORDER — LISINOPRIL-HYDROCHLOROTHIAZIDE 10-12.5 MG PO TABS: 1.0000 | ORAL_TABLET | Freq: Every day | ORAL | Status: DC

## 2023-09-13 MED ORDER — CALCIUM CARBONATE ANTACID 500 MG PO CHEW
1.0000 | CHEWABLE_TABLET | Freq: Three times a day (TID) | ORAL | Status: DC
  Administered 2023-09-13 – 2023-09-14 (×3): 200 mg via ORAL
  Filled 2023-09-13 (×3): qty 1

## 2023-09-13 MED ORDER — VITAMIN B-12 100 MCG PO TABS
500.0000 ug | ORAL_TABLET | Freq: Every day | ORAL | Status: DC
  Administered 2023-09-14: 500 ug via ORAL
  Filled 2023-09-13: qty 5

## 2023-09-13 MED ORDER — SODIUM CHLORIDE 0.9 % IV SOLN
1.0000 g | INTRAVENOUS | Status: DC
  Administered 2023-09-13 – 2023-09-14 (×2): 1 g via INTRAVENOUS
  Filled 2023-09-13 (×2): qty 10

## 2023-09-13 MED ORDER — DILTIAZEM HCL ER COATED BEADS 180 MG PO CP24
180.0000 mg | ORAL_CAPSULE | Freq: Every day | ORAL | Status: DC
  Administered 2023-09-13 – 2023-09-14 (×2): 180 mg via ORAL
  Filled 2023-09-13 (×2): qty 1

## 2023-09-13 NOTE — Progress Notes (Signed)
 Initial Nutrition Assessment  DOCUMENTATION CODES:   Not applicable  INTERVENTION:   Add Calcium carbonate (TUMS) 500 mg TID. Increase MVI to BID to ensure adequate vitamin and mineral provision for hx Roux-en-Y gastric bypass. Resume vitamin B-12 supplement 500 mcg daily (recommendation for post Roux-en-Y gastric bypass). Continue Boost Breeze po TID, each supplement provides 250 kcal and 9 grams of protein.  NUTRITION DIAGNOSIS:   Increased nutrient needs related to other (see comment) (hx Roux-en-Y gastric bypass in 2017) as evidenced by other (comment) (recommended vitamin supplementation).  GOAL:   Patient will meet greater than or equal to 90% of their needs  MONITOR:   PO intake, Supplement acceptance  REASON FOR ASSESSMENT:   Consult Assessment of nutrition requirement/status (hx gastric bypass)  ASSESSMENT:   68 yo female admitted with SBO. She had reduction of an inguinal hernia 5/8 in the ED. PMH includes HLD, asthma, HTN, GERD, DM, Roux-en-Y gastric bypass 06/24/2015.  5/23: NPO 5/24: Clear liquids, Boost Breeze TID 5/25: Full liquids 5/26: Soft diet    Meal intakes have not been recorded since advancing to full liquids. Per [facility], she is drinking the Boost Breeze supplements TID.   Labs reviewed. Calcium 8.5 (5/23 with albumin 3.8 WNL), A1C 6.2 (5/23) CBG: [id number]  Medications reviewed and include hydrochlorothiazide, novolog , MVI with minerals, protonix , miralax .  Per review of home meds list, patient was taking a MVI and vitamin B-12 daily. Unsure if this is a bariatric MVI. Calcium supplement not listed on home meds list. Serum calcium level was low on admission, so patient was likely not taking this PTA.   Weight history reviewed. Weight has been stable for the past 2 years.  NUTRITION - FOCUSED PHYSICAL EXAM:  Unable to complete  Diet Order:   Diet Order             DIET SOFT Fluid consistency: Thin  Diet effective now                    EDUCATION NEEDS:   Not appropriate for education at this time  Skin:  Skin Assessment: Reviewed RN Assessment  Last BM:  5/24 type 7  Height:   Ht Readings from Last 1 Encounters:  09/11/23 5\' 3"  (1.6 m)    Weight:   Wt Readings from Last 1 Encounters:  09/11/23 68.8 kg    Ideal Body Weight:  52.3 kg  BMI:  Body mass index is 26.87 kg/m.  Estimated Nutritional Needs:   Kcal:  1600-1800  Protein:  80-90 gm  Fluid:  1.6-1.8 L   Barnet Boots RD, LDN, CNSC Contact via secure chat. If unavailable, use group chat "RD Inpatient."

## 2023-09-13 NOTE — Progress Notes (Signed)
 Pt voicing concern about not having her blood pressure medication. Pt is very upset and feels she needs it. Kellogg RN

## 2023-09-13 NOTE — Progress Notes (Signed)
 Subjective: Patient denies any abdominal pain.  She did have a bowel movement this morning.  Objective: Vital signs in last 24 hours: Temp:  [97.5 F (36.4 C)-99.1 F (37.3 C)] 97.5 F (36.4 C) (05/26 0500) Pulse Rate:  [56-69] 56 (05/26 0500) Resp:  [16-18] 16 (05/26 0500) BP: (152-172)/(63-79) 163/79 (05/26 0813) SpO2:  [99 %-100 %] 100 % (05/26 0500) Last BM Date : 09/12/23  Intake/Output from previous day: 05/25 0701 - 05/26 0700 In: 1700 [P.O.:1700] Out: -  Intake/Output this shift: Total I/O In: 600 [P.O.:600] Out: -   General appearance: alert, cooperative, and no distress GI: soft, non-tender; bowel sounds normal; no masses,  no organomegaly and easily reducible right inguinal hernia  Lab Results:  Recent Labs    09/11/23 0818 09/12/23 0551  WBC 5.1 4.8  HGB 10.3* 9.3*  HCT 31.4* 29.0*  PLT 158 130*   BMET Recent Labs    09/12/23 0551 09/13/23 0333  NA 139 135  K 3.6 3.5  CL 109 107  CO2 23 24  GLUCOSE 89 74  BUN 10 8  CREATININE 0.59 0.58  CALCIUM 7.9* 7.8*   PT/INR No results for input(s): "LABPROT", "INR" in the last 72 hours.  Studies/Results: ECHOCARDIOGRAM COMPLETE Result Date: 09/12/2023    ECHOCARDIOGRAM REPORT   Patient Name:   Lisa Todd Date of Exam: 09/12/2023 Medical Rec #:  [id number]       Height:       63.0 in Accession #:    [id number]      Weight:       151.7 lb Date of Birth:  1956-02-05       BSA:          1.719 m Patient Age:    68 years        BP:           140/97 mmHg Patient Gender: F               HR:           67 bpm. Exam [facility] Procedure: 2D Echo, Cardiac Doppler and Color Doppler (Both Spectral and Color            Flow Doppler were utilized during procedure). Indications:    Dyspnea R06.00  History:        Patient has no prior history of Echocardiogram examinations.                 Risk Factors:Hypertension, Diabetes and Sleep Apnea.  Sonographer:    Astrid Blamer Referring Phys: [id number] NKEIRUKA J  EZENDUKA IMPRESSIONS  1. Left ventricular ejection fraction, by estimation, is 60 to 65%. The left ventricle has normal function. The left ventricle has no regional wall motion abnormalities. Left ventricular diastolic parameters were normal.  2. Right ventricular systolic function is normal. The right ventricular size is normal.  3. Left atrial size was mildly dilated.  4. The mitral valve is normal in structure. Trivial mitral valve regurgitation. No evidence of mitral stenosis.  5. The aortic valve is tricuspid. Aortic valve regurgitation is not visualized. No aortic stenosis is present.  6. The inferior vena cava is normal in size with greater than 50% respiratory variability, suggesting right atrial pressure of 3 mmHg. Comparison(s): No prior Echocardiogram. FINDINGS  Left Ventricle: Left ventricular ejection fraction, by estimation, is 60 to 65%. The left ventricle has normal function. The left ventricle has no regional wall motion abnormalities. The left ventricular internal cavity  size was normal in size. There is  no left ventricular hypertrophy. Left ventricular diastolic parameters were normal. Right Ventricle: The right ventricular size is normal. Right ventricular systolic function is normal. Left Atrium: Left atrial size was mildly dilated. Right Atrium: Right atrial size was normal in size. Pericardium: There is no evidence of pericardial effusion. Mitral Valve: The mitral valve is normal in structure. Trivial mitral valve regurgitation. No evidence of mitral valve stenosis. Tricuspid Valve: The tricuspid valve is normal in structure. Tricuspid valve regurgitation is mild . No evidence of tricuspid stenosis. Aortic Valve: The aortic valve is tricuspid. Aortic valve regurgitation is not visualized. No aortic stenosis is present. Aortic valve mean gradient measures 5.0 mmHg. Aortic valve peak gradient measures 10.1 mmHg. Aortic valve area, by VTI measures 2.45  cm. Pulmonic Valve: The pulmonic valve  was normal in structure. Pulmonic valve regurgitation is not visualized. No evidence of pulmonic stenosis. Aorta: The aortic root is normal in size and structure. Venous: The inferior vena cava is normal in size with greater than 50% respiratory variability, suggesting right atrial pressure of 3 mmHg. IAS/Shunts: No atrial level shunt detected by color flow Doppler.  LEFT VENTRICLE PLAX 2D LVIDd:         4.90 cm   Diastology LVIDs:         3.40 cm   LV e' medial:    9.25 cm/s LV PW:         0.90 cm   LV E/e' medial:  10.5 LV IVS:        1.10 cm   LV e' lateral:   9.46 cm/s LVOT diam:     1.90 cm   LV E/e' lateral: 10.3 LV SV:         91 LV SV Index:   53 LVOT Area:     2.84 cm  LEFT ATRIUM             Index LA Vol (A2C):   70.7 ml 41.12 ml/m LA Vol (A4C):   49.8 ml 28.97 ml/m LA Biplane Vol: 69.6 ml 40.48 ml/m  AORTIC VALVE AV Area (Vmax):    2.23 cm AV Area (Vmean):   2.51 cm AV Area (VTI):     2.45 cm AV Vmax:           159.00 cm/s AV Vmean:          111.000 cm/s AV VTI:            0.372 m AV Peak Grad:      10.1 mmHg AV Mean Grad:      5.0 mmHg LVOT Vmax:         125.00 cm/s LVOT Vmean:        98.300 cm/s LVOT VTI:          0.322 m LVOT/AV VTI ratio: 0.87  AORTA Ao Root diam: 2.50 cm MITRAL VALVE               TRICUSPID VALVE MV Area (PHT): 3.53 cm    TR Peak grad:   26.8 mmHg MV Decel Time: 215 msec    TR Vmax:        259.00 cm/s MV E velocity: 97.30 cm/s MV A velocity: 86.10 cm/s  SHUNTS MV E/A ratio:  1.13        Systemic VTI:  0.32 m  Systemic Diam: 1.90 cm Alexandria Angel MD Electronically signed by Alexandria Angel MD Signature Date/Time: 09/12/2023/4:40:31 PM    Final    CT ABDOMEN PELVIS W CONTRAST Result Date: 09/12/2023 CLINICAL DATA:  Bowel obstruction suspected. Concern for small bowel stricture. EXAM: CT ABDOMEN AND PELVIS WITH CONTRAST TECHNIQUE: Multidetector CT imaging of the abdomen and pelvis was performed using the standard protocol following bolus  administration of intravenous contrast. RADIATION DOSE REDUCTION: This exam was performed according to the departmental dose-optimization program which includes automated exposure control, adjustment of the mA and/or kV according to patient size and/or use of iterative reconstruction technique. CONTRAST:  OMNIPAQUE  IOHEXOL  300 MG/ML  SOLN COMPARISON:  09/10/2023. FINDINGS: Lower chest: There are trace bilateral pleural effusions with atelectasis at the lung bases. Hepatobiliary: No focal abnormality is seen in the liver. Layering hyperdense material is noted in the gallbladder, possible sludge. No biliary ductal dilatation is seen. Pancreas: There is redemonstration of mild pancreatic ductal dilatation measuring up to 5 mm at the pancreatic head. No surrounding inflammatory changes are seen. Spleen: Normal in size without focal abnormality. Adrenals/Urinary Tract: Adrenal glands are within normal limits. The kidneys enhance symmetrically. No renal calculus or hydronephrosis is seen. There is diffuse bladder wall thickening and a Foley catheter terminates in the urinary bladder. Stomach/Bowel: There is a nonobstructive bowel gas pattern. A segmental loop of small bowel is noted in the right lower quadrant with bowel wall thickening and surrounding edema. Contrast is identified within the colon. No free air or pneumatosis is seen. A few scattered diverticular present along the colon without evidence of diverticulitis. A normal appendix is seen in the right lower quadrant. Vascular/Lymphatic: Aortic atherosclerosis. No enlarged abdominal or pelvic lymph nodes. Reproductive: Status post hysterectomy. No adnexal masses. Other: There is a mesenteric edema in the lower abdomen and pelvis. A small amount of free fluid is abdomen and pelvis and a right inguinal hernia. Anasarca is noted. Musculoskeletal: Degenerative changes are noted in the thoracolumbar spine. No acute osseous abnormality is seen. IMPRESSION: 1. No  evidence of obstruction or stricture. 2. Segmental bowel wall thickening involving the distal ileum with surrounding mesenteric edema, suggesting infectious or inflammatory enteritis. 3. Diffuse bladder wall thickening, compatible with cystitis. 4. Mesenteric edema, small ascites, and anasarca. 5. Layering hyperdense material in gallbladder, likely sludge. Electronically Signed   By: Wyvonnia Heimlich M.D.   On: 09/12/2023 16:27   US  Venous Img Lower Bilateral (DVT) Result Date: 09/12/2023 CLINICAL DATA:  Lower extremity edema. EXAM: BILATERAL LOWER EXTREMITY VENOUS DOPPLER ULTRASOUND TECHNIQUE: Gray-scale sonography with graded compression, as well as color Doppler and duplex ultrasound were performed to evaluate the lower extremity deep venous systems from the level of the common femoral vein and including the common femoral, femoral, profunda femoral, popliteal and calf veins including the posterior tibial, peroneal and gastrocnemius veins when visible. The superficial great saphenous vein was also interrogated. Spectral Doppler was utilized to evaluate flow at rest and with distal augmentation maneuvers in the common femoral, femoral and popliteal veins. COMPARISON:  12/17/2022 FINDINGS: RIGHT LOWER EXTREMITY Common Femoral Vein: No evidence of thrombus. Normal compressibility, respiratory phasicity and response to augmentation. Saphenofemoral Junction: No evidence of thrombus. Normal compressibility and flow on color Doppler imaging. Profunda Femoral Vein: No evidence of thrombus. Normal compressibility and flow on color Doppler imaging. Femoral Vein: No evidence of thrombus. Normal compressibility, respiratory phasicity and response to augmentation. Popliteal Vein: No evidence of thrombus. Normal compressibility, respiratory phasicity and response to augmentation. Calf  Veins: No evidence of thrombus. Normal compressibility and flow on color Doppler imaging. Superficial Great Saphenous Vein: No evidence of  thrombus. Normal compressibility. Venous Reflux:  None. Other Findings: No evidence of superficial thrombophlebitis or abnormal fluid collection. LEFT LOWER EXTREMITY Common Femoral Vein: No evidence of thrombus. Normal compressibility, respiratory phasicity and response to augmentation. Saphenofemoral Junction: No evidence of thrombus. Normal compressibility and flow on color Doppler imaging. Profunda Femoral Vein: No evidence of thrombus. Normal compressibility and flow on color Doppler imaging. Femoral Vein: No evidence of thrombus. Normal compressibility, respiratory phasicity and response to augmentation. Popliteal Vein: No evidence of thrombus. Normal compressibility, respiratory phasicity and response to augmentation. Calf Veins: No evidence of thrombus. Normal compressibility and flow on color Doppler imaging. Superficial Great Saphenous Vein: No evidence of thrombus. Normal compressibility. Venous Reflux:  None. Other Findings: No evidence of superficial thrombophlebitis or abnormal fluid collection. IMPRESSION: No evidence of deep venous thrombosis in either lower extremity. Electronically Signed   By: Erica Hau M.D.   On: 09/12/2023 10:36   DG CHEST PORT 1 VIEW Result Date: 09/11/2023 CLINICAL DATA:  [id number] Dyspnea [id number] EXAM: PORTABLE CHEST 1 VIEW COMPARISON:  Aug 26, 2023 FINDINGS: The cardiomediastinal silhouette is unchanged in contour.Atherosclerotic calcifications. No pleural effusion. No pneumothorax. No acute pleuroparenchymal abnormality. IMPRESSION: No acute cardiopulmonary abnormality. Electronically Signed   By: Clancy Crimes M.D.   On: 09/11/2023 13:47    Anti-infectives: Anti-infectives (From admission, onward)    None       Assessment/Plan: Impression: Small bowel obstruction secondary to right inguinal hernia.  CT scan reassuring that no strictures present.  The mesenteric edema may have been secondary to her recurrent episodes of incarceration.  Bowel function  has returned. Plan: Will advance to soft diet.  Will monitor over the next 24 hours.  Patient would like to do the surgery electively, which hopefully we will be able to do.  She is scheduled to see me in my office on 10/08/2023.  LOS: 3 days    Alanda Allegra 09/13/2023

## 2023-09-13 NOTE — Plan of Care (Signed)
   Problem: Education: Goal: Knowledge of General Education information will improve Description Including pain rating scale, medication(s)/side effects and non-pharmacologic comfort measures Outcome: Progressing   Problem: Health Behavior/Discharge Planning: Goal: Ability to manage health-related needs will improve Outcome: Progressing

## 2023-09-13 NOTE — Progress Notes (Signed)
 PROGRESS NOTE  BOWEN GOYAL GNF:[id number] DOB: Nov 30, 1955 DOA: 09/10/2023 PCP: Narda Bacon, MD  HPI/Recap of past 24 hours: Lisa Todd is a 68 y.o. female with medical history significant of asthma, T2DM, depression, hyperlipidemia, and GERD presented with worsening abdominal pain, nausea and vomiting.  Of note, on 05/08 presented to the ED for right inguinal hernia, was manually reduced and patient was discharged home.  Due to this new onset of severe abdominal pain, EMS was called, found to be bradycardic in the 40s, received atropine and was transported to the ED. in the ED, vitals fairly stable except for heart rates in the 50s.  Labs showed creatinine of 1.23, sodium 131, troponin 4, WBC 13.  EKG with no acute ST changes.  CT abdomen/pelvis with findings compatible with early or partial SBO, noted body wall edema.  Patient admitted for further management.    Today, patient complains of dysuria and urinary frequency.  Had a BM.  Eager to advance diet.     Assessment/Plan: Principal Problem:   SBO (small bowel obstruction) (HCC) Active Problems:   AKI (acute kidney injury) (HCC)   HYPERTENSION   Asthma   Type 2 diabetes mellitus (HCC)   Anxiety   Inguinal hernia of right side without obstruction or gangrene   SBO CT abdomen/pelvis with findings compatible with early or partial SBO, noted body wall edema Repeat CT A/P showed no evidence of obstruction or stricture, segmental bowel wall thickening involving the distal ileum with surrounding mesenteric edema suggesting infectious or inflammatory enteritis, diffuse bladder wall thickening, compatible with cystitis General Surgery consulted, advance diet As needed analgesics and antiemetics Supportive care  Bilateral lower extremity edema BNP 507 Chest x-ray unremarkable ECHO unremarkable, with normal EF, no regional wall motion abnormality BLE Doppler negative  Acute urinary retention  Reports dysuria, urinary frequency  after foley removed on 5/25 UA with moderate leukocytes, moderate hemoglobin, WBC 21-50, bacteria none seen UC pending  Start ceftriaxone, Pyridium   Type 2 diabetes mellitus Last A1c on 08/2023-->6.2 SSI, Accu-Cheks, hypoglycemic protocol  HTN BP stable Restart diltiazem  at a lower dose, restart lisinopril/htz   Asthma No signs of acute exacerbation Continue with as needed albuterol     Estimated body mass index is 26.87 kg/m as calculated from the following:   Height as of this encounter: 5\' 3"  (1.6 m).   Weight as of this encounter: 68.8 kg.     Code Status: Full  Family Communication: None at bedside  Disposition Plan: Status is: Inpatient Remains inpatient appropriate because: Level of care      Consultants: General Surgery  Procedures: None  Antimicrobials: Ceftriaxone  DVT prophylaxis: Lovenox    Objective: Vitals:   09/12/23 2006 09/13/23 0500 09/13/23 0813 09/13/23 1343  BP: (!) 155/67 (!) 172/63 (!) 163/79 (!) 142/86  Pulse: 69 (!) 56  79  Resp: 16 16  18   Temp: 99.1 F (37.3 C) (!) 97.5 F (36.4 C)  98 F (36.7 C)  TempSrc: Oral Axillary    SpO2: 99% 100%  100%  Weight:      Height:        Intake/Output Summary (Last 24 hours) at 09/13/2023 1539 Last data filed at 09/13/2023 1300 Gross per 24 hour  Intake 1600 ml  Output --  Net 1600 ml   Filed Weights   09/10/23 1820 09/11/23 0620  Weight: 68 kg 68.8 kg    Exam: General: NAD  Cardiovascular: S1, S2 present Respiratory: CTAB Abdomen: Soft, tender, nondistended, bowel sounds  present Musculoskeletal: No bilateral pedal edema noted Skin: Normal Psychiatry: Normal mood   Data Reviewed: CBC: Recent Labs  Lab 09/10/23 1926 09/11/23 0818 09/12/23 0551  WBC 13.0* 5.1 4.8  NEUTROABS 11.7*  --   --   HGB 12.0 10.3* 9.3*  HCT 37.3 31.4* 29.0*  MCV 93.3 93.5 93.9  PLT 168 158 130*   Basic Metabolic Panel: Recent Labs  Lab 09/10/23 1926 09/11/23 0818 09/12/23 0551  09/13/23 0333  NA 131* 134* 139 135  K 4.0 4.0 3.6 3.5  CL 103 106 109 107  CO2 16* 22 23 24   GLUCOSE 229* 147* 89 74  BUN 34* 21 10 8   CREATININE 1.23* 0.74 0.59 0.58  CALCIUM 8.5* 8.1* 7.9* 7.8*   GFR: Estimated Creatinine Clearance: 64.4 mL/min (by C-G formula based on SCr of 0.58 mg/dL). Liver Function Tests: Recent Labs  Lab 09/10/23 1926  AST 24  ALT 20  ALKPHOS 52  BILITOT 0.7  PROT 7.2  ALBUMIN 3.8   No results for input(s): "LIPASE", "AMYLASE" in the last 168 hours. No results for input(s): "AMMONIA" in the last 168 hours. Coagulation Profile: No results for input(s): "INR", "PROTIME" in the last 168 hours. Cardiac Enzymes: No results for input(s): "CKTOTAL", "CKMB", "CKMBINDEX", "TROPONINI" in the last 168 hours. BNP (last 3 results) No results for input(s): "PROBNP" in the last 8760 hours. HbA1C: Recent Labs    09/10/23 1926  HGBA1C 6.2*   CBG: Recent Labs  Lab 09/12/23 1136 09/12/23 1638 09/12/23 2009 09/13/23 0739 09/13/23 1122  GLUCAP 89 84 92 75 74   Lipid Profile: No results for input(s): "CHOL", "HDL", "LDLCALC", "TRIG", "CHOLHDL", "LDLDIRECT" in the last 72 hours. Thyroid Function Tests: No results for input(s): "TSH", "T4TOTAL", "FREET4", "T3FREE", "THYROIDAB" in the last 72 hours. Anemia Panel: No results for input(s): "VITAMINB12", "FOLATE", "FERRITIN", "TIBC", "IRON", "RETICCTPCT" in the last 72 hours. Urine analysis:    Component Value Date/Time   COLORURINE STRAW (A) 09/12/2023 1700   APPEARANCEUR CLEAR 09/12/2023 1700   LABSPEC 1.017 09/12/2023 1700   PHURINE 7.0 09/12/2023 1700   GLUCOSEU NEGATIVE 09/12/2023 1700   HGBUR MODERATE (A) 09/12/2023 1700   HGBUR negative 07/01/2010 1002   BILIRUBINUR NEGATIVE 09/12/2023 1700   BILIRUBINUR negative 07/27/2023 1455   BILIRUBINUR neg 12/17/2011 1451   KETONESUR NEGATIVE 09/12/2023 1700   PROTEINUR NEGATIVE 09/12/2023 1700   UROBILINOGEN 1.0 07/27/2023 1455   UROBILINOGEN 0.2  04/09/2013 0749   NITRITE NEGATIVE 09/12/2023 1700   LEUKOCYTESUR MODERATE (A) 09/12/2023 1700   Sepsis Labs: @LABRCNTIP (procalcitonin:4,lacticidven:4)  )No results found for this or any previous visit (from the past 240 hours).    Studies: No results found.   Scheduled Meds:  calcium carbonate  1 tablet Oral TID   enoxaparin  (LOVENOX ) injection  40 mg Subcutaneous Q24H   feeding supplement  1 Container Oral TID BM   lisinopril  10 mg Oral Daily   And   hydrochlorothiazide  12.5 mg Oral Daily   insulin  aspart  0-9 Units Subcutaneous TID WC   multivitamin with minerals  1 tablet Oral BID WC   pantoprazole  (PROTONIX ) IV  40 mg Intravenous Q12H   phenazopyridine   100 mg Oral TID WC   polyethylene glycol  17 g Oral Daily   [START ON 09/14/2023] vitamin B-12  500 mcg Oral Daily    Continuous Infusions:  cefTRIAXone (ROCEPHIN)  IV 1 g (09/13/23 1349)      LOS: 3 days     Veronica Gordon,  MD [facility]  If 7PM-7AM, please contact night-coverage [email or web address] 09/13/2023, 3:39 PM

## 2023-09-14 ENCOUNTER — Encounter (HOSPITAL_COMMUNITY): Payer: Self-pay | Admitting: Internal Medicine

## 2023-09-14 DIAGNOSIS — K56609 Unspecified intestinal obstruction, unspecified as to partial versus complete obstruction: Secondary | ICD-10-CM | POA: Diagnosis not present

## 2023-09-14 LAB — GLUCOSE, CAPILLARY
Glucose-Capillary: 90 mg/dL (ref 70–99)
Glucose-Capillary: 89 mg/dL (ref 70–99)

## 2023-09-14 LAB — URINE CULTURE: Culture: 30000 — AB

## 2023-09-14 LAB — CBC
HCT: 31.1 % — ABNORMAL LOW (ref 36.0–46.0)
Hemoglobin: 9.9 g/dL — ABNORMAL LOW (ref 12.0–15.0)
MCH: 29.1 pg (ref 26.0–34.0)
MCHC: 31.8 g/dL (ref 30.0–36.0)
MCV: 91.5 fL (ref 80.0–100.0)
Platelets: 158 10*3/uL (ref 150–400)
RBC: 3.4 MIL/uL — ABNORMAL LOW (ref 3.87–5.11)
RDW: 12.5 % (ref 11.5–15.5)
WBC: 5.2 10*3/uL (ref 4.0–10.5)
nRBC: 0 % (ref 0.0–0.2)

## 2023-09-14 LAB — BASIC METABOLIC PANEL WITH GFR
Anion gap: 4 — ABNORMAL LOW (ref 5–15)
BUN: 9 mg/dL (ref 8–23)
CO2: 25 mmol/L (ref 22–32)
Calcium: 7.8 mg/dL — ABNORMAL LOW (ref 8.9–10.3)
Chloride: 105 mmol/L (ref 98–111)
Creatinine, Ser: 0.61 mg/dL (ref 0.44–1.00)
GFR, Estimated: >60 mL/min (ref >60)
Glucose, Bld: 88 mg/dL (ref 70–99)
Potassium: 3.4 mmol/L — ABNORMAL LOW (ref 3.5–5.1)
Sodium: 134 mmol/L — ABNORMAL LOW (ref 135–145)

## 2023-09-14 MED ORDER — PHENAZOPYRIDINE HCL 100 MG PO TABS: 100.0000 mg | ORAL_TABLET | Freq: Three times a day (TID) | ORAL | 0 refills | Status: AC | PRN

## 2023-09-14 MED ORDER — POTASSIUM CHLORIDE CRYS ER 20 MEQ PO TBCR
40.0000 meq | EXTENDED_RELEASE_TABLET | Freq: Once | ORAL | Status: AC
  Administered 2023-09-14: 40 meq via ORAL
  Filled 2023-09-14: qty 2

## 2023-09-14 MED ORDER — DILTIAZEM HCL ER COATED BEADS 180 MG PO CP24: 180.0000 mg | ORAL_CAPSULE | Freq: Every day | ORAL | 0 refills | Status: DC

## 2023-09-14 MED ORDER — POLYETHYLENE GLYCOL 3350 17 G PO PACK: 17.0000 g | PACK | Freq: Every day | ORAL | 0 refills | Status: DC

## 2023-09-14 MED ORDER — CEFADROXIL 500 MG PO CAPS
500.0000 mg | ORAL_CAPSULE | Freq: Two times a day (BID) | ORAL | 0 refills | Status: AC
Start: 2023-09-15 — End: 2023-09-18

## 2023-09-14 MED ORDER — PANTOPRAZOLE SODIUM 40 MG PO TBEC: 40.0000 mg | DELAYED_RELEASE_TABLET | Freq: Every day | ORAL | 0 refills | Status: DC

## 2023-09-14 MED ORDER — INSULIN GLARGINE 100 UNIT/ML ~~LOC~~ SOLN: 1.0000 [IU] | Freq: Every evening | SUBCUTANEOUS | 0 refills | Status: AC | PRN

## 2023-09-14 NOTE — Progress Notes (Signed)
 Subjective: Occasional discomfort in right lower quadrant.  Did have a small bowel movement yesterday.  Objective: Vital signs in last 24 hours: Temp:  [98 F (36.7 C)-98.4 F (36.9 C)] 98.1 F (36.7 C) (05/27 0404) Pulse Rate:  [52-79] 52 (05/27 0404) Resp:  [18] 18 (05/27 0404) BP: (142-160)/(69-86) 160/69 (05/27 0404) SpO2:  [98 %-100 %] 100 % (05/27 0404) Last BM Date : 09/12/23  Intake/Output from previous day: 05/26 0701 - 05/27 0700 In: 1582.4 [P.O.:1480; IV Piggyback:102.4] Out: -  Intake/Output this shift: No intake/output data recorded.  General appearance: alert, cooperative, and no distress GI: soft, non-tender; bowel sounds normal; no masses,  no organomegaly and reducible right hernia  Lab Results:  Recent Labs    09/12/23 0551 09/14/23 0403  WBC 4.8 5.2  HGB 9.3* 9.9*  HCT 29.0* 31.1*  PLT 130* 158   BMET Recent Labs    09/13/23 0333 09/14/23 0403  NA 135 134*  K 3.5 3.4*  CL 107 105  CO2 24 25  GLUCOSE 74 88  BUN 8 9  CREATININE 0.58 0.61  CALCIUM 7.8* 7.8*   PT/INR No results for input(s): "LABPROT", "INR" in the last 72 hours.  Studies/Results: ECHOCARDIOGRAM COMPLETE Result Date: 09/12/2023    ECHOCARDIOGRAM REPORT   Patient Name:   Lisa Todd Date of Exam: 09/12/2023 Medical Rec #:  [id number]       Height:       63.0 in Accession #:    [id number]      Weight:       151.7 lb Date of Birth:  1956/04/03       BSA:          1.719 m Patient Age:    67 years        BP:           140/97 mmHg Patient Gender: F               HR:           67 bpm. Exam [facility] Procedure: 2D Echo, Cardiac Doppler and Color Doppler (Both Spectral and Color            Flow Doppler were utilized during procedure). Indications:    Dyspnea R06.00  History:        Patient has no prior history of Echocardiogram examinations.                 Risk Factors:Hypertension, Diabetes and Sleep Apnea.  Sonographer:    Astrid Blamer Referring Phys: [id number] NKEIRUKA J  EZENDUKA IMPRESSIONS  1. Left ventricular ejection fraction, by estimation, is 60 to 65%. The left ventricle has normal function. The left ventricle has no regional wall motion abnormalities. Left ventricular diastolic parameters were normal.  2. Right ventricular systolic function is normal. The right ventricular size is normal.  3. Left atrial size was mildly dilated.  4. The mitral valve is normal in structure. Trivial mitral valve regurgitation. No evidence of mitral stenosis.  5. The aortic valve is tricuspid. Aortic valve regurgitation is not visualized. No aortic stenosis is present.  6. The inferior vena cava is normal in size with greater than 50% respiratory variability, suggesting right atrial pressure of 3 mmHg. Comparison(s): No prior Echocardiogram. FINDINGS  Left Ventricle: Left ventricular ejection fraction, by estimation, is 60 to 65%. The left ventricle has normal function. The left ventricle has no regional wall motion abnormalities. The left ventricular internal cavity size was normal in  size. There is  no left ventricular hypertrophy. Left ventricular diastolic parameters were normal. Right Ventricle: The right ventricular size is normal. Right ventricular systolic function is normal. Left Atrium: Left atrial size was mildly dilated. Right Atrium: Right atrial size was normal in size. Pericardium: There is no evidence of pericardial effusion. Mitral Valve: The mitral valve is normal in structure. Trivial mitral valve regurgitation. No evidence of mitral valve stenosis. Tricuspid Valve: The tricuspid valve is normal in structure. Tricuspid valve regurgitation is mild . No evidence of tricuspid stenosis. Aortic Valve: The aortic valve is tricuspid. Aortic valve regurgitation is not visualized. No aortic stenosis is present. Aortic valve mean gradient measures 5.0 mmHg. Aortic valve peak gradient measures 10.1 mmHg. Aortic valve area, by VTI measures 2.45  cm. Pulmonic Valve: The pulmonic valve  was normal in structure. Pulmonic valve regurgitation is not visualized. No evidence of pulmonic stenosis. Aorta: The aortic root is normal in size and structure. Venous: The inferior vena cava is normal in size with greater than 50% respiratory variability, suggesting right atrial pressure of 3 mmHg. IAS/Shunts: No atrial level shunt detected by color flow Doppler.  LEFT VENTRICLE PLAX 2D LVIDd:         4.90 cm   Diastology LVIDs:         3.40 cm   LV e' medial:    9.25 cm/s LV PW:         0.90 cm   LV E/e' medial:  10.5 LV IVS:        1.10 cm   LV e' lateral:   9.46 cm/s LVOT diam:     1.90 cm   LV E/e' lateral: 10.3 LV SV:         91 LV SV Index:   53 LVOT Area:     2.84 cm  LEFT ATRIUM             Index LA Vol (A2C):   70.7 ml 41.12 ml/m LA Vol (A4C):   49.8 ml 28.97 ml/m LA Biplane Vol: 69.6 ml 40.48 ml/m  AORTIC VALVE AV Area (Vmax):    2.23 cm AV Area (Vmean):   2.51 cm AV Area (VTI):     2.45 cm AV Vmax:           159.00 cm/s AV Vmean:          111.000 cm/s AV VTI:            0.372 m AV Peak Grad:      10.1 mmHg AV Mean Grad:      5.0 mmHg LVOT Vmax:         125.00 cm/s LVOT Vmean:        98.300 cm/s LVOT VTI:          0.322 m LVOT/AV VTI ratio: 0.87  AORTA Ao Root diam: 2.50 cm MITRAL VALVE               TRICUSPID VALVE MV Area (PHT): 3.53 cm    TR Peak grad:   26.8 mmHg MV Decel Time: 215 msec    TR Vmax:        259.00 cm/s MV E velocity: 97.30 cm/s MV A velocity: 86.10 cm/s  SHUNTS MV E/A ratio:  1.13        Systemic VTI:  0.32 m                            Systemic  Diam: 1.90 cm Alexandria Angel MD Electronically signed by Alexandria Angel MD Signature Date/Time: 09/12/2023/4:40:31 PM    Final    CT ABDOMEN PELVIS W CONTRAST Result Date: 09/12/2023 CLINICAL DATA:  Bowel obstruction suspected. Concern for small bowel stricture. EXAM: CT ABDOMEN AND PELVIS WITH CONTRAST TECHNIQUE: Multidetector CT imaging of the abdomen and pelvis was performed using the standard protocol following bolus  administration of intravenous contrast. RADIATION DOSE REDUCTION: This exam was performed according to the departmental dose-optimization program which includes automated exposure control, adjustment of the mA and/or kV according to patient size and/or use of iterative reconstruction technique. CONTRAST:  OMNIPAQUE  IOHEXOL  300 MG/ML  SOLN COMPARISON:  09/10/2023. FINDINGS: Lower chest: There are trace bilateral pleural effusions with atelectasis at the lung bases. Hepatobiliary: No focal abnormality is seen in the liver. Layering hyperdense material is noted in the gallbladder, possible sludge. No biliary ductal dilatation is seen. Pancreas: There is redemonstration of mild pancreatic ductal dilatation measuring up to 5 mm at the pancreatic head. No surrounding inflammatory changes are seen. Spleen: Normal in size without focal abnormality. Adrenals/Urinary Tract: Adrenal glands are within normal limits. The kidneys enhance symmetrically. No renal calculus or hydronephrosis is seen. There is diffuse bladder wall thickening and a Foley catheter terminates in the urinary bladder. Stomach/Bowel: There is a nonobstructive bowel gas pattern. A segmental loop of small bowel is noted in the right lower quadrant with bowel wall thickening and surrounding edema. Contrast is identified within the colon. No free air or pneumatosis is seen. A few scattered diverticular present along the colon without evidence of diverticulitis. A normal appendix is seen in the right lower quadrant. Vascular/Lymphatic: Aortic atherosclerosis. No enlarged abdominal or pelvic lymph nodes. Reproductive: Status post hysterectomy. No adnexal masses. Other: There is a mesenteric edema in the lower abdomen and pelvis. A small amount of free fluid is abdomen and pelvis and a right inguinal hernia. Anasarca is noted. Musculoskeletal: Degenerative changes are noted in the thoracolumbar spine. No acute osseous abnormality is seen. IMPRESSION: 1. No  evidence of obstruction or stricture. 2. Segmental bowel wall thickening involving the distal ileum with surrounding mesenteric edema, suggesting infectious or inflammatory enteritis. 3. Diffuse bladder wall thickening, compatible with cystitis. 4. Mesenteric edema, small ascites, and anasarca. 5. Layering hyperdense material in gallbladder, likely sludge. Electronically Signed   By: Wyvonnia Heimlich M.D.   On: 09/12/2023 16:27   US  Venous Img Lower Bilateral (DVT) Result Date: 09/12/2023 CLINICAL DATA:  Lower extremity edema. EXAM: BILATERAL LOWER EXTREMITY VENOUS DOPPLER ULTRASOUND TECHNIQUE: Gray-scale sonography with graded compression, as well as color Doppler and duplex ultrasound were performed to evaluate the lower extremity deep venous systems from the level of the common femoral vein and including the common femoral, femoral, profunda femoral, popliteal and calf veins including the posterior tibial, peroneal and gastrocnemius veins when visible. The superficial great saphenous vein was also interrogated. Spectral Doppler was utilized to evaluate flow at rest and with distal augmentation maneuvers in the common femoral, femoral and popliteal veins. COMPARISON:  12/17/2022 FINDINGS: RIGHT LOWER EXTREMITY Common Femoral Vein: No evidence of thrombus. Normal compressibility, respiratory phasicity and response to augmentation. Saphenofemoral Junction: No evidence of thrombus. Normal compressibility and flow on color Doppler imaging. Profunda Femoral Vein: No evidence of thrombus. Normal compressibility and flow on color Doppler imaging. Femoral Vein: No evidence of thrombus. Normal compressibility, respiratory phasicity and response to augmentation. Popliteal Vein: No evidence of thrombus. Normal compressibility, respiratory phasicity and response to augmentation. Calf Veins:  No evidence of thrombus. Normal compressibility and flow on color Doppler imaging. Superficial Great Saphenous Vein: No evidence of  thrombus. Normal compressibility. Venous Reflux:  None. Other Findings: No evidence of superficial thrombophlebitis or abnormal fluid collection. LEFT LOWER EXTREMITY Common Femoral Vein: No evidence of thrombus. Normal compressibility, respiratory phasicity and response to augmentation. Saphenofemoral Junction: No evidence of thrombus. Normal compressibility and flow on color Doppler imaging. Profunda Femoral Vein: No evidence of thrombus. Normal compressibility and flow on color Doppler imaging. Femoral Vein: No evidence of thrombus. Normal compressibility, respiratory phasicity and response to augmentation. Popliteal Vein: No evidence of thrombus. Normal compressibility, respiratory phasicity and response to augmentation. Calf Veins: No evidence of thrombus. Normal compressibility and flow on color Doppler imaging. Superficial Great Saphenous Vein: No evidence of thrombus. Normal compressibility. Venous Reflux:  None. Other Findings: No evidence of superficial thrombophlebitis or abnormal fluid collection. IMPRESSION: No evidence of deep venous thrombosis in either lower extremity. Electronically Signed   By: Erica Hau M.D.   On: 09/12/2023 10:36    Anti-infectives: Anti-infectives (From admission, onward)    Start     Dose/Rate Route Frequency Ordered Stop   09/13/23 1430  cefTRIAXone (ROCEPHIN) 1 g in sodium chloride  0.9 % 100 mL IVPB        1 g 200 mL/hr over 30 Minutes Intravenous Every 24 hours 09/13/23 1337         Assessment/Plan: Impression: Partial small bowel obstruction secondary to right inguinal hernia, resolved Plan: Patient is deciding whether to be discharged home today and follow-up in my office as an outpatient or undergo surgery tomorrow.  She will let me know her decision later on today.  LOS: 4 days    Alanda Allegra 09/14/2023

## 2023-09-14 NOTE — Discharge Summary (Signed)
 Physician Discharge Summary   Patient: Lisa Todd MRN: [id number] DOB: 1955/09/11  Admit date:     09/10/2023  Discharge date: 09/14/23  Discharge Physician: Veronica Gordon   PCP: Narda Bacon, MD   Recommendations at discharge:   Follow-up with PCP Follow-up with general surgery Dr. Larrie Po as scheduled  Discharge Diagnoses: Principal Problem:   SBO (small bowel obstruction) (HCC) Active Problems:   AKI (acute kidney injury) (HCC)   HYPERTENSION   Asthma   Type 2 diabetes mellitus (HCC)   Anxiety   Inguinal hernia of right side without obstruction or gangrene    Hospital Course: Lisa Todd is a 68 y.o. female with medical history significant of asthma, T2DM, depression, hyperlipidemia, and GERD presented with worsening abdominal pain, nausea and vomiting.  Of note, on 05/08 presented to the ED for right inguinal hernia, was manually reduced and patient was discharged home.  Due to this new onset of severe abdominal pain, EMS was called, found to be bradycardic in the 40s, received atropine and was transported to the ED. in the ED, vitals fairly stable except for heart rates in the 50s.  Labs showed creatinine of 1.23, sodium 131, troponin 4, WBC 13.  EKG with no acute ST changes.  CT abdomen/pelvis with findings compatible with early or partial SBO, noted body wall edema.  Patient admitted for further management.    Today, patient reports feeling much better, had a small BM.  Still with some very mild dysuria, but reports improvement overall.  Still with some right lower quadrant abdominal tenderness.  Patient tolerating orally, denies any nausea/vomiting, fever/chills.  Patient stable to discharge from a medical standpoint.   Assessment and Plan:  SBO Right inguinal hernia CT abdomen/pelvis with findings compatible with early or partial SBO, noted body wall edema Repeat CT A/P showed no evidence of obstruction or stricture, segmental bowel wall thickening involving  the distal ileum with surrounding mesenteric edema suggesting infectious or inflammatory enteritis, diffuse bladder wall thickening, compatible with cystitis General Surgery consulted, advanced diet, outpatient follow-up for right inguinal hernia repair   Bilateral lower extremity edema Resolved BNP 507 Chest x-ray unremarkable ECHO unremarkable, with normal EF, no regional wall motion abnormality BLE Doppler negative   Acute urinary retention  Reports dysuria, urinary frequency after foley removed on 5/25 UA with moderate leukocytes, moderate hemoglobin, WBC 21-50, bacteria none seen UC grew 30,000 E. coli S/p ceftriaxone, will DC on p.o. cefadroxil x 3 days to complete 5 days of antibiotics, continue Pyridium  as needed   Type 2 diabetes mellitus Last A1c on 08/2023-->6.2 Continue home regimen   HTN BP stable, heart rates in the 50s to 60s Reduced diltiazem  at a lower dose, given significant bradycardia requiring atrial pain on admission (may adjust pending BP/heart rate) Continue lisinopril/htz   Asthma No signs of acute exacerbation Continue with as needed albuterol       Pain control - [place]  Controlled Substance Reporting System database was reviewed. and patient was instructed, not to drive, operate heavy machinery, perform activities at heights, swimming or participation in water activities or provide baby-sitting services while on Pain, Sleep and Anxiety Medications; until their outpatient Physician has advised to do so again. Also recommended to not to take more than prescribed Pain, Sleep and Anxiety Medications.    Consultants: General Surgery Procedures performed: None Disposition: Home Diet recommendation:  Discharge Diet Orders (From admission, onward)     Start     Ordered   09/14/23 0000  Diet - low sodium heart healthy        09/14/23 1031            DISCHARGE MEDICATION: Allergies as of 09/14/2023       Reactions   Liraglutide Hives, Other  (See Comments)   Sweating    Morphine     Migraine        Medication List     STOP taking these medications    Cardizem  LA 360 MG 24 hr tablet Generic drug: diltiazem  Replaced by: diltiazem  180 MG 24 hr capsule       TAKE these medications    albuterol  108 (90 Base) MCG/ACT inhaler Commonly known as: VENTOLIN  HFA Inhale 1-2 puffs into the lungs every 6 (six) hours as needed for wheezing. What changed: how much to take   aspirin EC 81 MG tablet Take 81 mg by mouth daily.   cefadroxil 500 MG capsule Commonly known as: DURICEF Take 1 capsule (500 mg total) by mouth 2 (two) times daily for 3 days. Start taking on: Sep 15, 2023   celecoxib  200 MG capsule Commonly known as: CELEBREX  Take 200 mg by mouth daily as needed for mild pain (pain score 1-3).   cyanocobalamin 1000 MCG tablet Commonly known as: VITAMIN B12 Take 2,000 mcg by mouth daily.   diclofenac  Sodium 1 % Gel Commonly known as: Voltaren  Apply 2 g topically 4 (four) times daily as needed. What changed: reasons to take this   diltiazem  180 MG 24 hr capsule Commonly known as: CARDIZEM  CD Take 1 capsule (180 mg total) by mouth daily. Start taking on: Sep 15, 2023 Replaces: Cardizem  LA 360 MG 24 hr tablet   ibuprofen  600 MG tablet Commonly known as: ADVIL  Take 1 tablet (600 mg total) by mouth every 6 (six) hours as needed. What changed: reasons to take this   insulin  glargine 100 UNIT/ML injection Commonly known as: LANTUS  Inject 0.01-0.03 mLs (1-3 Units total) into the skin at bedtime as needed (blood sugar over 280).   Januvia 100 MG tablet Generic drug: sitaGLIPtin Take 100 mg by mouth daily.   lisinopril-hydrochlorothiazide 10-12.5 MG tablet Commonly known as: ZESTORETIC Take 1 tablet by mouth daily.   Mounjaro 10 MG/0.5ML Pen Generic drug: tirzepatide Inject 10 mg into the skin every Sunday.   multivitamin with minerals Tabs tablet Take 1 tablet by mouth daily.    oxyCODONE -acetaminophen  10-325 MG tablet Commonly known as: PERCOCET Take 1 tablet by mouth 4 (four) times daily.   pantoprazole  40 MG tablet Commonly known as: Protonix  Take 1 tablet (40 mg total) by mouth daily.   phenazopyridine  100 MG tablet Commonly known as: PYRIDIUM  Take 1 tablet (100 mg total) by mouth 3 (three) times daily as needed for up to 2 days for pain.   polyethylene glycol 17 g packet Commonly known as: MIRALAX  / GLYCOLAX  Take 17 g by mouth daily. What changed:  when to take this reasons to take this        Follow-up Information     Narda Bacon, MD Follow up.   Specialty: Internal Medicine Contact information: [address] [place] [address] [phone]         Alanda Allegra, MD Follow up.   Specialty: General Surgery Why: follow up Contact information: [address] [facility] [address] [phone]                Discharge Exam: Filed Weights   09/10/23 1820 09/11/23 0620  Weight: 68 kg 68.8 kg  General: NAD  Cardiovascular: S1, S2 present Respiratory: CTAB Abdomen: Soft, nontender, nondistended, bowel sounds present Musculoskeletal: No bilateral pedal edema noted Skin: Normal Psychiatry: Normal mood   Condition at discharge: stable  The results of significant diagnostics from this hospitalization (including imaging, microbiology, ancillary and laboratory) are listed below for reference.   Imaging Studies: ECHOCARDIOGRAM COMPLETE Result Date: 09/12/2023    ECHOCARDIOGRAM REPORT   Patient Name:   ROSALI AUGELLO Date of Exam: 09/12/2023 Medical Rec #:  [id number]       Height:       63.0 in Accession #:    [id number]      Weight:       151.7 lb Date of Birth:  1955-11-28       BSA:          1.719 m Patient Age:    67 years        BP:           140/97 mmHg Patient Gender: F               HR:           67 bpm. Exam Location:  [facility] Procedure: 2D Echo, Cardiac Doppler and Color Doppler (Both Spectral and  Color            Flow Doppler were utilized during procedure). Indications:    Dyspnea R06.00  History:        Patient has no prior history of Echocardiogram examinations.                 Risk Factors:Hypertension, Diabetes and Sleep Apnea.  Sonographer:    Astrid Blamer Referring Phys: [id number] Keina Mutch J Theodora Lalanne IMPRESSIONS  1. Left ventricular ejection fraction, by estimation, is 60 to 65%. The left ventricle has normal function. The left ventricle has no regional wall motion abnormalities. Left ventricular diastolic parameters were normal.  2. Right ventricular systolic function is normal. The right ventricular size is normal.  3. Left atrial size was mildly dilated.  4. The mitral valve is normal in structure. Trivial mitral valve regurgitation. No evidence of mitral stenosis.  5. The aortic valve is tricuspid. Aortic valve regurgitation is not visualized. No aortic stenosis is present.  6. The inferior vena cava is normal in size with greater than 50% respiratory variability, suggesting right atrial pressure of 3 mmHg. Comparison(s): No prior Echocardiogram. FINDINGS  Left Ventricle: Left ventricular ejection fraction, by estimation, is 60 to 65%. The left ventricle has normal function. The left ventricle has no regional wall motion abnormalities. The left ventricular internal cavity size was normal in size. There is  no left ventricular hypertrophy. Left ventricular diastolic parameters were normal. Right Ventricle: The right ventricular size is normal. Right ventricular systolic function is normal. Left Atrium: Left atrial size was mildly dilated. Right Atrium: Right atrial size was normal in size. Pericardium: There is no evidence of pericardial effusion. Mitral Valve: The mitral valve is normal in structure. Trivial mitral valve regurgitation. No evidence of mitral valve stenosis. Tricuspid Valve: The tricuspid valve is normal in structure. Tricuspid valve regurgitation is mild . No evidence of tricuspid  stenosis. Aortic Valve: The aortic valve is tricuspid. Aortic valve regurgitation is not visualized. No aortic stenosis is present. Aortic valve mean gradient measures 5.0 mmHg. Aortic valve peak gradient measures 10.1 mmHg. Aortic valve area, by VTI measures 2.45  cm. Pulmonic Valve: The pulmonic valve was normal in structure. Pulmonic valve regurgitation is not visualized. No  evidence of pulmonic stenosis. Aorta: The aortic root is normal in size and structure. Venous: The inferior vena cava is normal in size with greater than 50% respiratory variability, suggesting right atrial pressure of 3 mmHg. IAS/Shunts: No atrial level shunt detected by color flow Doppler.  LEFT VENTRICLE PLAX 2D LVIDd:         4.90 cm   Diastology LVIDs:         3.40 cm   LV e' medial:    9.25 cm/s LV PW:         0.90 cm   LV E/e' medial:  10.5 LV IVS:        1.10 cm   LV e' lateral:   9.46 cm/s LVOT diam:     1.90 cm   LV E/e' lateral: 10.3 LV SV:         91 LV SV Index:   53 LVOT Area:     2.84 cm  LEFT ATRIUM             Index LA Vol (A2C):   70.7 ml 41.12 ml/m LA Vol (A4C):   49.8 ml 28.97 ml/m LA Biplane Vol: 69.6 ml 40.48 ml/m  AORTIC VALVE AV Area (Vmax):    2.23 cm AV Area (Vmean):   2.51 cm AV Area (VTI):     2.45 cm AV Vmax:           159.00 cm/s AV Vmean:          111.000 cm/s AV VTI:            0.372 m AV Peak Grad:      10.1 mmHg AV Mean Grad:      5.0 mmHg LVOT Vmax:         125.00 cm/s LVOT Vmean:        98.300 cm/s LVOT VTI:          0.322 m LVOT/AV VTI ratio: 0.87  AORTA Ao Root diam: 2.50 cm MITRAL VALVE               TRICUSPID VALVE MV Area (PHT): 3.53 cm    TR Peak grad:   26.8 mmHg MV Decel Time: 215 msec    TR Vmax:        259.00 cm/s MV E velocity: 97.30 cm/s MV A velocity: 86.10 cm/s  SHUNTS MV E/A ratio:  1.13        Systemic VTI:  0.32 m                            Systemic Diam: 1.90 cm Alexandria Angel MD Electronically signed by Alexandria Angel MD Signature Date/Time: 09/12/2023/4:40:31 PM    Final    CT  ABDOMEN PELVIS W CONTRAST Result Date: 09/12/2023 CLINICAL DATA:  Bowel obstruction suspected. Concern for small bowel stricture. EXAM: CT ABDOMEN AND PELVIS WITH CONTRAST TECHNIQUE: Multidetector CT imaging of the abdomen and pelvis was performed using the standard protocol following bolus administration of intravenous contrast. RADIATION DOSE REDUCTION: This exam was performed according to the departmental dose-optimization program which includes automated exposure control, adjustment of the mA and/or kV according to patient size and/or use of iterative reconstruction technique. CONTRAST:  OMNIPAQUE  IOHEXOL  300 MG/ML  SOLN COMPARISON:  09/10/2023. FINDINGS: Lower chest: There are trace bilateral pleural effusions with atelectasis at the lung bases. Hepatobiliary: No focal abnormality is seen in the liver. Layering hyperdense material is noted in the gallbladder, possible  sludge. No biliary ductal dilatation is seen. Pancreas: There is redemonstration of mild pancreatic ductal dilatation measuring up to 5 mm at the pancreatic head. No surrounding inflammatory changes are seen. Spleen: Normal in size without focal abnormality. Adrenals/Urinary Tract: Adrenal glands are within normal limits. The kidneys enhance symmetrically. No renal calculus or hydronephrosis is seen. There is diffuse bladder wall thickening and a Foley catheter terminates in the urinary bladder. Stomach/Bowel: There is a nonobstructive bowel gas pattern. A segmental loop of small bowel is noted in the right lower quadrant with bowel wall thickening and surrounding edema. Contrast is identified within the colon. No free air or pneumatosis is seen. A few scattered diverticular present along the colon without evidence of diverticulitis. A normal appendix is seen in the right lower quadrant. Vascular/Lymphatic: Aortic atherosclerosis. No enlarged abdominal or pelvic lymph nodes. Reproductive: Status post hysterectomy. No adnexal masses. Other:  There is a mesenteric edema in the lower abdomen and pelvis. A small amount of free fluid is abdomen and pelvis and a right inguinal hernia. Anasarca is noted. Musculoskeletal: Degenerative changes are noted in the thoracolumbar spine. No acute osseous abnormality is seen. IMPRESSION: 1. No evidence of obstruction or stricture. 2. Segmental bowel wall thickening involving the distal ileum with surrounding mesenteric edema, suggesting infectious or inflammatory enteritis. 3. Diffuse bladder wall thickening, compatible with cystitis. 4. Mesenteric edema, small ascites, and anasarca. 5. Layering hyperdense material in gallbladder, likely sludge. Electronically Signed   By: Wyvonnia Heimlich M.D.   On: 09/12/2023 16:27   US  Venous Img Lower Bilateral (DVT) Result Date: 09/12/2023 CLINICAL DATA:  Lower extremity edema. EXAM: BILATERAL LOWER EXTREMITY VENOUS DOPPLER ULTRASOUND TECHNIQUE: Gray-scale sonography with graded compression, as well as color Doppler and duplex ultrasound were performed to evaluate the lower extremity deep venous systems from the level of the common femoral vein and including the common femoral, femoral, profunda femoral, popliteal and calf veins including the posterior tibial, peroneal and gastrocnemius veins when visible. The superficial great saphenous vein was also interrogated. Spectral Doppler was utilized to evaluate flow at rest and with distal augmentation maneuvers in the common femoral, femoral and popliteal veins. COMPARISON:  12/17/2022 FINDINGS: RIGHT LOWER EXTREMITY Common Femoral Vein: No evidence of thrombus. Normal compressibility, respiratory phasicity and response to augmentation. Saphenofemoral Junction: No evidence of thrombus. Normal compressibility and flow on color Doppler imaging. Profunda Femoral Vein: No evidence of thrombus. Normal compressibility and flow on color Doppler imaging. Femoral Vein: No evidence of thrombus. Normal compressibility, respiratory phasicity and  response to augmentation. Popliteal Vein: No evidence of thrombus. Normal compressibility, respiratory phasicity and response to augmentation. Calf Veins: No evidence of thrombus. Normal compressibility and flow on color Doppler imaging. Superficial Great Saphenous Vein: No evidence of thrombus. Normal compressibility. Venous Reflux:  None. Other Findings: No evidence of superficial thrombophlebitis or abnormal fluid collection. LEFT LOWER EXTREMITY Common Femoral Vein: No evidence of thrombus. Normal compressibility, respiratory phasicity and response to augmentation. Saphenofemoral Junction: No evidence of thrombus. Normal compressibility and flow on color Doppler imaging. Profunda Femoral Vein: No evidence of thrombus. Normal compressibility and flow on color Doppler imaging. Femoral Vein: No evidence of thrombus. Normal compressibility, respiratory phasicity and response to augmentation. Popliteal Vein: No evidence of thrombus. Normal compressibility, respiratory phasicity and response to augmentation. Calf Veins: No evidence of thrombus. Normal compressibility and flow on color Doppler imaging. Superficial Great Saphenous Vein: No evidence of thrombus. Normal compressibility. Venous Reflux:  None. Other Findings: No evidence of superficial thrombophlebitis or abnormal  fluid collection. IMPRESSION: No evidence of deep venous thrombosis in either lower extremity. Electronically Signed   By: Erica Hau M.D.   On: 09/12/2023 10:36   DG CHEST PORT 1 VIEW Result Date: 09/11/2023 CLINICAL DATA:  [id number] Dyspnea [id number] EXAM: PORTABLE CHEST 1 VIEW COMPARISON:  Aug 26, 2023 FINDINGS: The cardiomediastinal silhouette is unchanged in contour.Atherosclerotic calcifications. No pleural effusion. No pneumothorax. No acute pleuroparenchymal abnormality. IMPRESSION: No acute cardiopulmonary abnormality. Electronically Signed   By: Clancy Crimes M.D.   On: 09/11/2023 13:47   CT ABDOMEN PELVIS W CONTRAST Result  Date: 09/10/2023 CLINICAL DATA:  Right lower quadrant abdominal pain. Near syncope. Bradycardia. EXAM: CT ABDOMEN AND PELVIS WITH CONTRAST TECHNIQUE: Multidetector CT imaging of the abdomen and pelvis was performed using the standard protocol following bolus administration of intravenous contrast. RADIATION DOSE REDUCTION: This exam was performed according to the departmental dose-optimization program which includes automated exposure control, adjustment of the mA and/or kV according to patient size and/or use of iterative reconstruction technique. CONTRAST:  OMNIPAQUE  IOHEXOL  300 MG/ML  SOLN COMPARISON:  CT abdomen pelvis 08/26/2023 FINDINGS: Lower chest: No acute abnormality. Hepatobiliary: No focal hepatic lesion. Mild periportal edema. Gallbladder and biliary tree are unremarkable. Pancreas: Unchanged dilation of the pancreatic duct in the pancreatic head measuring 7 mm. No evidence of acute pancreatitis. Spleen: Unremarkable. Adrenals/Urinary Tract: Stable adrenal glands. No urinary calculi or hydronephrosis. Unremarkable bladder. Stomach/Bowel: Fluid-filled loops of small bowel in the lower abdomen and pelvis at the upper limits of normal in caliber measuring up to 3.1 cm in diameter. There is abrupt tapering in the right lower quadrant with mild upstream fecalization (circa series 4/image 38). No bowel wall thickening. Postoperative change of gastric bypass. Appendix is normal. Vascular/Lymphatic: Aortic atherosclerosis. No enlarged abdominal or pelvic lymph nodes. Reproductive: Hysterectomy.  No adnexal mass. Other: Right inguinal hernia containing low-density fluid. Mesenteric and body wall edema. Musculoskeletal: No acute fracture. IMPRESSION: 1. Fluid-filled loops of small bowel in the lower abdomen and pelvis at the upper limits of normal in caliber measuring up to 3.1 cm in diameter. There is abrupt tapering in the right lower quadrant with mild upstream fecalization. Findings are compatible with  early or partial small bowel obstruction. 2. Periportal, mesenteric, and body wall edema. Correlate with cardiac function and fluid status. 3. Aortic Atherosclerosis (ICD10-I70.0). Electronically Signed   By: Rozell Cornet M.D.   On: 09/10/2023 21:28   DG Chest Portable 1 View Result Date: 08/26/2023 CLINICAL DATA:  Cough. Right lower quadrant abdominal pain with nausea and vomiting for 2 days. EXAM: PORTABLE CHEST 1 VIEW COMPARISON:  Abdominopelvic CT same date and 08/24/2023. Chest radiographs 08/24/2023. FINDINGS: 1110 hours. The heart size and mediastinal contours are stable. There is aortic atherosclerosis. The lungs remain clear. No pleural effusion or pneumothorax. No evidence of pneumoperitoneum. Stable mild degenerative changes in the spine without evidence of acute osseous abnormality. IMPRESSION: No evidence of acute cardiopulmonary process. Electronically Signed   By: Elmon Hagedorn M.D.   On: 08/26/2023 11:53   CT ABDOMEN PELVIS W CONTRAST Result Date: 08/26/2023 CLINICAL DATA:  Two day history of right lower quadrant pain, nausea, and vomiting EXAM: CT ABDOMEN AND PELVIS WITH CONTRAST TECHNIQUE: Multidetector CT imaging of the abdomen and pelvis was performed using the standard protocol following bolus administration of intravenous contrast. RADIATION DOSE REDUCTION: This exam was performed according to the departmental dose-optimization program which includes automated exposure control, adjustment of the mA and/or kV according to patient size and/or use  of iterative reconstruction technique. CONTRAST:  OMNIPAQUE  IOHEXOL  300 MG/ML  SOLN COMPARISON:  CT abdomen and pelvis dated 08/24/2023 FINDINGS: Lower chest: No focal consolidation or pulmonary nodule in the lung bases. No pleural effusion or pneumothorax demonstrated. Partially imaged heart size is normal. Hepatobiliary: Unchanged ill-defined hypodensity associated with calcific foci in segment 4A. No intra or extrahepatic biliary ductal  dilation. Normal gallbladder. Pancreas: No focal lesions or main ductal dilation. Spleen: Normal in size without focal abnormality. Adrenals/Urinary Tract: No adrenal nodules. No suspicious renal mass, calculi or hydronephrosis. No focal bladder wall thickening. Stomach/Bowel: Postsurgical changes of Roux-en-Y gastric bypass. Interval decreased size of previously noted small bowel dilation with largely decompressed small bowel loops throughout. Marked gas-filled dilation of the rectum. Moderate volume stool throughout the colon. Normal appendix. Vascular/Lymphatic: Aortic atherosclerosis. No enlarged abdominal or pelvic lymph nodes. Reproductive: No adnexal masses. Other: Trace mesenteric free fluid. No free air or fluid collection. Musculoskeletal: No acute or abnormal lytic or blastic osseous lesions. Right inguinal hernia is again seen containing a single loop of nonobstructed small bowel. IMPRESSION: 1. Persistent right inguinal hernia containing a single loop of nonobstructed small bowel. 2. Interval decreased size of previously noted small bowel dilation with largely decompressed small bowel loops throughout. Normal appendix. 3. Marked gas-filled dilation of the rectum. Moderate volume stool throughout the colon. 4.  Aortic Atherosclerosis (ICD10-I70.0). Electronically Signed   By: Limin  Xu M.D.   On: 08/26/2023 10:07   CT ABDOMEN PELVIS W CONTRAST Result Date: 08/24/2023 CLINICAL DATA:  Abdominal pain, acute, nonlocalized. EXAM: CT ABDOMEN AND PELVIS WITH CONTRAST TECHNIQUE: Multidetector CT imaging of the abdomen and pelvis was performed using the standard protocol following bolus administration of intravenous contrast. RADIATION DOSE REDUCTION: This exam was performed according to the departmental dose-optimization program which includes automated exposure control, adjustment of the mA and/or kV according to patient size and/or use of iterative reconstruction technique. CONTRAST:  OMNIPAQUE  IOHEXOL   300 MG/ML  SOLN COMPARISON:  CT scan renal stone protocol from 12/23/2021. FINDINGS: Lower chest: The lung bases are clear. No pleural effusion. The heart is normal in size. No pericardial effusion. Hepatobiliary: The liver is normal in size. Non-cirrhotic configuration. No suspicious mass. Redemonstration of subcapsular ill-defined hypoattenuating focus in the left hepatic lobe, segment 4A with associated few sub 5 mm dystrophic calcifications. This is incompletely characterized but without significant interval change. No intrahepatic or extrahepatic bile duct dilation. No calcified gallstones. Normal gallbladder wall thickness. No pericholecystic inflammatory changes. Pancreas: Unremarkable. No pancreatic ductal dilatation or surrounding inflammatory changes. Spleen: Within normal limits. No focal lesion. Adrenals/Urinary Tract: Adrenal glands are unremarkable. No suspicious renal mass. Left extrarenal pelvis noted. No obstructive uropathy or nephroureterolithiasis on either side. Urinary bladder is under distended, precluding optimal assessment. However, no large mass or stones identified. No perivesical fat stranding. Stomach/Bowel: Postsurgical changes from prior gastric bypass noted. There is a small sliding hiatal hernia. There is a new, small right inguinal hernia containing portion of small bowel loop. Proximal to this hernia sac, there are multiple dilated small bowel loops exhibiting fecalization. The bowel loops are dilated measuring up to 3.1 cm in diameter. No abnormal bowel wall thickening or surrounding fat stranding. Distal to this hernia sac, remaining small bowel loops are collapsed. Colon is also collapsed and exhibits small to moderate stool burden. The appendix is unremarkable. There are multiple diverticula mainly in the left hemi colon, without imaging signs of diverticulitis. Vascular/Lymphatic: No ascites or pneumoperitoneum. No abdominal or pelvic  lymphadenopathy, by size criteria. No  aneurysmal dilation of the major abdominal arteries. There are mild peripheral atherosclerotic vascular calcifications of the aorta and its major branches. Reproductive: The uterus is surgically absent. No large adnexal mass. Other: Right inguinal hernia, as described above. There is mild to moderate anasarca. The soft tissues and abdominal wall are otherwise unremarkable. Musculoskeletal: No suspicious osseous lesions. There are mild multilevel degenerative changes in the visualized spine. IMPRESSION: 1. There is a small right inguinal hernia containing portion of small bowel loop. There is resultant small bowel obstruction proximal to the hernia. No evidence of bowel ischemia or pneumoperitoneum. 2. Multiple other nonacute observations, as described above. Aortic Atherosclerosis (ICD10-I70.0). Electronically Signed   By: Beula Brunswick M.D.   On: 08/24/2023 17:02   CT Head Wo Contrast Result Date: 08/24/2023 CLINICAL DATA:  Mental status change, unknown cause EXAM: CT HEAD WITHOUT CONTRAST TECHNIQUE: Contiguous axial images were obtained from the base of the skull through the vertex without intravenous contrast. RADIATION DOSE REDUCTION: This exam was performed according to the departmental dose-optimization program which includes automated exposure control, adjustment of the mA and/or kV according to patient size and/or use of iterative reconstruction technique. COMPARISON:  March 09, 2019 FINDINGS: Brain: The ventricles appear age appropriate. No mass effect or midline shift. Gray-white differentiation is preserved without focal attenuation abnormality. No evidence of acute territorial infarction, extra-axial fluid collection, hemorrhage, or mass lesion. The basilar cisterns are patent without downward herniation. The cerebellar hemispheres and vermis are well formed without mass lesion or focal attenuation abnormality. Vascular: No hyperdense vessel. Skull: Normal. Negative for fracture or focal lesion.  Sinuses/Orbits: The paranasal sinuses and mastoids are clear. The globes appear intact. No retrobulbar hematoma. Other: None. IMPRESSION: No acute intracranial abnormality, specifically, no acute hemorrhage, territorial infarction, or intracranial mass. Electronically Signed   By: Rance Burrows M.D.   On: 08/24/2023 14:48   DG Chest 2 View Result Date: 08/24/2023 CLINICAL DATA:  Syncope. EXAM: CHEST - 2 VIEW COMPARISON:  Sep 07, 2020. FINDINGS: The heart size and mediastinal contours are within normal limits. Both lungs are clear. The visualized skeletal structures are unremarkable. IMPRESSION: No active cardiopulmonary disease. Electronically Signed   By: Rosalene Colon M.D.   On: 08/24/2023 13:55    Microbiology: Results for orders placed or performed during the hospital encounter of 09/10/23  Urine Culture (for pregnant, neutropenic or urologic patients or patients with an indwelling urinary catheter)     Status: Abnormal   Collection Time: 09/12/23  5:00 PM   Specimen: Urine, Clean Catch  Result Value Ref Range Status   Specimen Description   Final    URINE, CLEAN CATCH Performed at [facility], [address] [place], [place] [address]    Special Requests   Final    NONE Performed at [facility], [address] [place], [place] [address]    Culture 30,000 COLONIES/mL ESCHERICHIA COLI (A)  Final   Report Status 09/14/2023 FINAL  Final   Organism ID, Bacteria ESCHERICHIA COLI (A)  Final      Susceptibility   Escherichia coli - MIC*    AMPICILLIN 4 SENSITIVE Sensitive     CEFAZOLIN  <=4 SENSITIVE Sensitive     CEFEPIME <=0.12 SENSITIVE Sensitive     CEFTRIAXONE <=0.25 SENSITIVE Sensitive     CIPROFLOXACIN  <=0.25 SENSITIVE Sensitive     GENTAMICIN <=1 SENSITIVE Sensitive     IMIPENEM <=0.25 SENSITIVE Sensitive     NITROFURANTOIN  <=16 SENSITIVE Sensitive  TRIMETH/SULFA <=20 SENSITIVE Sensitive     AMPICILLIN/SULBACTAM <=2 SENSITIVE Sensitive     PIP/TAZO <=4 SENSITIVE  Sensitive ug/mL    * 30,000 COLONIES/mL ESCHERICHIA COLI    Labs: CBC: Recent Labs  Lab 09/10/23 1926 09/11/23 0818 09/12/23 0551 09/14/23 0403  WBC 13.0* 5.1 4.8 5.2  NEUTROABS 11.7*  --   --   --   HGB 12.0 10.3* 9.3* 9.9*  HCT 37.3 31.4* 29.0* 31.1*  MCV 93.3 93.5 93.9 91.5  PLT 168 158 130* 158   Basic Metabolic Panel: Recent Labs  Lab 09/10/23 1926 09/11/23 0818 09/12/23 0551 09/13/23 0333 09/14/23 0403  NA 131* 134* 139 135 134*  K 4.0 4.0 3.6 3.5 3.4*  CL 103 106 109 107 105  CO2 16* 22 23 24 25   GLUCOSE 229* 147* 89 74 88  BUN 34* 21 10 8 9   CREATININE 1.23* 0.74 0.59 0.58 0.61  CALCIUM 8.5* 8.1* 7.9* 7.8* 7.8*   Liver Function Tests: Recent Labs  Lab 09/10/23 1926  AST 24  ALT 20  ALKPHOS 52  BILITOT 0.7  PROT 7.2  ALBUMIN 3.8   CBG: Recent Labs  Lab 09/13/23 0739 09/13/23 1122 09/13/23 1634 09/13/23 2013 09/14/23 0711  GLUCAP 75 74 119* 77 90    Discharge time spent: greater than 30 minutes.  Signed: Veronica Gordon, MD [facility] 09/14/2023

## 2023-09-14 NOTE — Plan of Care (Signed)

## 2023-09-23 ENCOUNTER — Encounter: Payer: Self-pay | Admitting: General Surgery

## 2023-09-23 ENCOUNTER — Ambulatory Visit (INDEPENDENT_AMBULATORY_CARE_PROVIDER_SITE_OTHER): Admitting: General Surgery

## 2023-09-23 VITALS — BP 156/75 | HR 68 | Temp 98.1°F | Resp 14 | Ht 63.0 in | Wt 157.0 lb

## 2023-09-23 DIAGNOSIS — K4090 Unilateral inguinal hernia, without obstruction or gangrene, not specified as recurrent: Secondary | ICD-10-CM

## 2023-09-24 NOTE — Progress Notes (Signed)
 Subjective:     Lisa Todd  Patient is here for follow-up to schedule a right inguinal hernia repair.  She was recently hospitalized for a partial small bowel obstruction secondary to a right inguinal hernia.  Since she has been home, she has not had any further episodes of nausea or vomiting. Objective:    BP (!) 156/75   Pulse 68   Temp 98.1 F (36.7 C) (Oral)   Resp 14   Ht 5\' 3"  (1.6 m)   Wt 157 lb (71.2 kg)   SpO2 97%   BMI 27.81 kg/m   General:  alert, cooperative, and no distress  Abdomen is soft with a reducible right inguinal hernia present.     Assessment:    Right inguinal hernia    Plan:   Patient is scheduled for robotic assisted laparoscopic right inguinal herniorrhaphy with mesh on 10/07/2023.  The risks and benefits of the procedure including bleeding, infection, mesh use, and the possibility of recurrence of the hernia were fully explained to the patient, who gave informed consent.

## 2023-09-24 NOTE — H&P (Signed)
 Lisa Todd is an 67 y.o. female.   Chief Complaint: Right inguinal hernia HPI: Patient is a 68 year old black female who recently was admitted for a partial small bowel obstruction secondary to an incarcerated right inguinal hernia.  This was reduced and her bowel obstruction resolved.  She now presents for robotic assisted laparoscopic right inguinal herniorrhaphy with mesh.  Past Medical History:  Diagnosis Date   Arthritis    Asthma    Chronic back pain    Depression    Diabetes mellitus    hasn't take insulin  since weight loss 2020   Difficult intravenous access    required ultrasound for placement   GERD (gastroesophageal reflux disease)    hx of    Hyperlipidemia    Hypertension    Obesity    Sleep apnea    uses O2 2 liters  / currently has no cpap     Past Surgical History:  Procedure Laterality Date   ABDOMINAL HYSTERECTOMY     BLADDER REPAIR W/ CESAREAN SECTION  1989   CATARACT EXTRACTION W/PHACO Left 08/28/2019   Procedure: CATARACT EXTRACTION PHACO AND INTRAOCULAR LENS PLACEMENT (IOC) CDE: 18.46;  Surgeon: Tarri Farm, MD;  Location: [facility];  Service: Ophthalmology;  Laterality: Left;   CATARACT EXTRACTION W/PHACO Right 10/02/2019   Procedure: CATARACT EXTRACTION PHACO AND INTRAOCULAR LENS PLACEMENT (IOC);  Surgeon: Tarri Farm, MD;  Location: [facility];  Service: Ophthalmology;  Laterality: Right;  CDE: 8.13   GASTRIC ROUX-EN-Y N/A 06/24/2015   Procedure: LAPAROSCOPIC ROUX-EN-Y GASTRIC BYPASS WITH UPPER ENDOSCOPY;  Surgeon: Ayesha Lente, MD;  Location: [facility];  Service: General;  Laterality: N/A;   knee left arthroscopy  2004   PANNICULECTOMY Bilateral 09/04/2016   Procedure: PANNICULECTOMY;  Surgeon: Ayesha Lente, MD;  Location: [facility];  Service: General;  Laterality: Bilateral;   right knee surgery  2007   right rotator cuff surgery  11/26/2009   VESICOVAGINAL FISTULA CLOSURE W/ TAH  2002    Family History  Problem Relation Age of Onset   Breast  cancer Mother    Diabetes Mother    Hypertension Mother    Asthma Father    Diabetes Father    Hypertension Father    Diabetes Brother    Hypertension Brother    Diabetes Sister    Hypertension Sister    Social History:  reports that she has never smoked. She has never used smokeless tobacco. She reports current alcohol use. She reports that she does not use drugs.  Allergies:  Allergies  Allergen Reactions   Liraglutide Hives and Other (See Comments)    Sweating    Morphine      Migraine    No medications prior to admission.    No results found for this or any previous visit (from the past 48 hours). No results found.  Review of Systems  Constitutional:  Positive for fatigue.  HENT: Negative.    Eyes: Negative.   Respiratory: Negative.    Cardiovascular: Negative.   Gastrointestinal:  Positive for abdominal pain.  Endocrine: Negative.   Genitourinary: Negative.   Musculoskeletal: Negative.   Allergic/Immunologic: Negative.   Neurological: Negative.   Hematological: Negative.   Psychiatric/Behavioral: Negative.      There were no vitals taken for this visit. Physical Exam Vitals reviewed.  Constitutional:      Appearance: Normal appearance. She is normal weight. She is not ill-appearing.  HENT:     Head: Normocephalic and atraumatic.  Cardiovascular:     Rate and  Rhythm: Normal rate and regular rhythm.     Heart sounds: Normal heart sounds. No murmur heard.    No friction rub. No gallop.  Pulmonary:     Effort: Pulmonary effort is normal. No respiratory distress.     Breath sounds: Normal breath sounds. No stridor. No wheezing, rhonchi or rales.  Abdominal:     General: Bowel sounds are normal. There is no distension.     Palpations: Abdomen is soft. There is no mass.     Tenderness: There is no abdominal tenderness. There is no guarding or rebound.     Hernia: A hernia is present.  Skin:    General: Skin is warm and dry.  Neurological:     Mental  Status: She is alert and oriented to person, place, and time.      Assessment/Plan Impression: Right inguinal hernia Plan: Patient is scheduled for robotic assisted laparoscopic right inguinal herniorrhaphy with mesh on 10/07/2023.  The risks and benefits of the procedure including bleeding, infection, mesh use, and the possibility of recurrence of the hernia were fully explained to the patient, who gave informed consent.  Alanda Allegra, MD 09/24/2023, 7:17 AM

## 2023-10-04 ENCOUNTER — Encounter (HOSPITAL_COMMUNITY): Payer: Self-pay

## 2023-10-04 ENCOUNTER — Encounter (HOSPITAL_COMMUNITY)
Admission: RE | Admit: 2023-10-04 | Discharge: 2023-10-04 | Disposition: A | Discharge status: Home / Self Care | Source: Ambulatory Visit | Attending: General Surgery | Admitting: General Surgery

## 2023-10-04 NOTE — Patient Instructions (Signed)
 Lisa Todd  10/04/2023     @PREFPERIOPPHARMACY @   Your procedure is scheduled on  10/07/2023.   Report to [facility] at  0730  A.M.   Call this number if you have problems the morning of surgery:  [phone]  If you experience any cold or flu symptoms such as cough, fever, chills, shortness of breath, etc. between now and your scheduled surgery, please notify us  at the above number.   Remember:        Your last dose of mounjaro should have been on 09/29/2023 or before.        Your last dose of januvia should have been on 10/03/2023.       Take 1/2 of your usual insulin  dose the night before your procedure.        DO NOT take any medications for diabetes the morning of you procedure.        Use your inhaler before you coma and bring your rescus inhaler with you.   Do not eat after midnight.    You may drink clear liquids until  0530 am on 10/07/2023.   Clear liquids allowed are:                    Water , Juice (No red color; non-citric and without pulp; diabetics please choose diet or no sugar options), Carbonated beverages (diabetics please choose diet or no sugar options), Clear Tea (No creamer, milk, or cream, including half & half and powdered creamer), Black Coffee Only (No creamer, milk or cream, including half & half and powdered creamer), and Clear Sports drink (No red color; diabetics please choose diet or no sugar options)    Take these medicines the morning of surgery with A SIP OF WATER                     diltiazem , oxycodone  (if needed), pantoprazole .    Do not wear jewelry, make-up or nail polish, including gel polish,  artificial nails, or any other type of covering on natural nails (fingers and  toes).  Do not wear lotions, powders, or perfumes, or deodorant.  Do not shave 48 hours prior to surgery.  Men may shave face and neck.  Do not bring valuables to the hospital.  [facility] is not responsible for any belongings or  valuables.  Contacts, dentures or bridgework may not be worn into surgery.  Leave your suitcase in the car.  After surgery it may be brought to your room.  For patients admitted to the hospital, discharge time will be determined by your treatment team.  Patients discharged the day of surgery will not be allowed to drive home and must have someone with them for 24 hours.    Special instructions:   DO NOT smoke tobacco or vape for 24 hours before your procedure.  Please read over the following fact sheets that you were given. Pain Booklet, Coughing and Deep Breathing, Surgical Site Infection Prevention, Anesthesia Post-op Instructions, and Care and Recovery After Surgery      Laparoscopic Surgery for Groin Hernia in Adults: What to Know After After a laparoscopic surgery for groin hernia, it's common to have pain, discomfort, soreness, swelling, and bruising around the cuts that were made in the belly. There may also be swelling of the scrotum in males. Follow these instructions at home: Activity Rest as told. Get up and take short walks many times during the  day. This helps you breathe better and keeps your blood flowing. Ask for help if you feel weak or unsteady. Ask if it's OK for you to lift. Do not take baths, swim, or use a hot tub until you're told it's OK. Ask if you can shower. Ask what things are safe for you to do at home. Ask when you can go back to work or school. Medicines Take your medicines only as told. You may need to take steps to help treat or prevent trouble pooping (constipation), such as: Taking medicine to help you poop. Eating foods high in fiber, like beans, whole grains, and fresh fruits and vegetables. Drinking more fluids as told. Ask your health care provider if it's safe to drive or use machines while taking your medicine. Wound care  Take care of the cuts in your belly as told. Make sure you: Wash your hands with soap and water  for at least 20 seconds  before and after you change your bandage. If you can't use soap and water , use hand sanitizer. Change your bandage. Leave stitches or skin glue alone. Leave tape strips alone unless you're told to take them off. You may trim the edges of the tape strips if they curl up. Check the cuts on your belly every day for signs of infection. Check for: More redness, swelling, or pain. More fluid or blood. Warmth. Pus or a bad smell. Pain management  Use ice or an ice pack as told. Place a towel between your skin and the ice. Leave the ice on for 20 minutes, 2-3 times a day. If your skin turns red, take off the ice right away to prevent skin damage. The risk of damage is higher if you can't feel pain, heat, or cold. General instructions Do not smoke, vape, or use nicotine or tobacco. Doing this can slow healing. Wear compression stockings to reduce swelling and help prevent blood clots in your legs. You may be asked to continue to do deep breathing exercises at home. This will help to prevent a lung infection. Your provider may give you more instructions. Make sure you know what you can and can't do. Contact a health care provider if: You have any signs of infection. You have more swelling or pain in your scrotum. You have pain that gets worse or doesn't get better with medicine. You aren't able to pee. You haven't pooped in 3 days. You have a fever. You throw up or you feel like throwing up. Get help right away if: You have redness, warmth, or pain in your leg. You have chest pain. You have trouble breathing. You have very bad pain in your belly. You throw up each time you eat or drink. These symptoms may be an emergency. Call 911 right away. Do not wait to see if the symptoms will go away. Do not drive yourself to the hospital. This information is not intended to replace advice given to you by your health care provider. Make sure you discuss any questions you have with your health care  provider. Document Revised: 01/19/2023 Document Reviewed: 01/19/2023 Elsevier Patient Education  2025 [employer].General Anesthesia, Adult, Care After The following information offers guidance on how to care for yourself after your procedure. Your health care provider may also give you more specific instructions. If you have problems or questions, contact your health care provider. What can I expect after the procedure? After the procedure, it is common for people to: Have pain or discomfort at the IV  site. Have nausea or vomiting. Have a sore throat or hoarseness. Have trouble concentrating. Feel cold or chills. Feel weak, sleepy, or tired (fatigue). Have soreness and body aches. These can affect parts of the body that were not involved in surgery. Follow these instructions at home: For the time period you were told by your health care provider:  Rest. Do not participate in activities where you could fall or become injured. Do not drive or use machinery. Do not drink alcohol. Do not take sleeping pills or medicines that cause drowsiness. Do not make important decisions or sign legal documents. Do not take care of children on your own. General instructions Drink enough fluid to keep your urine pale yellow. If you have sleep apnea, surgery and certain medicines can increase your risk for breathing problems. Follow instructions from your health care provider about wearing your sleep device: Anytime you are sleeping, including during daytime naps. While taking prescription pain medicines, sleeping medicines, or medicines that make you drowsy. Return to your normal activities as told by your health care provider. Ask your health care provider what activities are safe for you. Take over-the-counter and prescription medicines only as told by your health care provider. Do not use any products that contain nicotine or tobacco. These products include cigarettes, chewing tobacco, and vaping  devices, such as e-cigarettes. These can delay incision healing after surgery. If you need help quitting, ask your health care provider. Contact a health care provider if: You have nausea or vomiting that does not get better with medicine. You vomit every time you eat or drink. You have pain that does not get better with medicine. You cannot urinate or have bloody urine. You develop a skin rash. You have a fever. Get help right away if: You have trouble breathing. You have chest pain. You vomit blood. These symptoms may be an emergency. Get help right away. Call 911. Do not wait to see if the symptoms will go away. Do not drive yourself to the hospital. Summary After the procedure, it is common to have a sore throat, hoarseness, nausea, vomiting, or to feel weak, sleepy, or fatigue. For the time period you were told by your health care provider, do not drive or use machinery. Get help right away if you have difficulty breathing, have chest pain, or vomit blood. These symptoms may be an emergency. This information is not intended to replace advice given to you by your health care provider. Make sure you discuss any questions you have with your health care provider. Document Revised: 07/04/2021 Document Reviewed: 07/04/2021 Elsevier Patient Education  2024 Else[employer] Inc.How to Use Chlorhexidine  at Home in the Shower Chlorhexidine  gluconate (CHG) is a germ-killing (antiseptic) wash that's used to clean the skin. It can get rid of the germs that normally live on the skin and can keep them away for about 24 hours. If you're having surgery, you may be told to shower with CHG at home the night before surgery. This can help lower your risk for infection. To use CHG wash in the shower, follow the steps below. Supplies needed: CHG body wash. Clean washcloth. Clean towel. How to use CHG in the shower Follow these steps unless you're told to use CHG in a different way: Start the shower. Use your  normal soap and shampoo to wash your face and hair. Turn off the shower or move out of the shower stream. Pour CHG onto a clean washcloth. Do not use any type of brush or rough  sponge. Start at your neck, washing your body down to your toes. Make sure you: Wash the part of your body where the surgery will be done for at least 1 minute. Do not scrub. Do not use CHG on your head or face unless your health care provider tells you to. If it gets into your ears or eyes, rinse them well with water . Do not wash your genitals with CHG. Wash your back and under your arms. Make sure to wash skin folds. Let the CHG sit on your skin for 1-2 minutes or as long as told. Rinse your entire body in the shower, including all body creases and folds. Turn off the shower. Dry off with a clean towel. Do not put anything on your skin afterward, such as powder, lotion, or perfume. Put on clean clothes or pajamas. If it's the night before surgery, sleep in clean sheets. General tips Use CHG only as told, and follow the instructions on the label. Use the full amount of CHG as told. This is often one bottle. Do not smoke and stay away from flames after using CHG. Your skin may feel sticky after using CHG. This is normal. The sticky feeling will go away as the CHG dries. Do not use CHG: If you have a chlorhexidine  allergy or have reacted to chlorhexidine  in the past. On open wounds or areas of skin that have broken skin, cuts, or scrapes. On babies younger than 42 months of age. Contact a health care provider if: You have questions about using CHG. Your skin gets irritated or itchy. You have a rash after using CHG. You swallow any CHG. Call your local poison control center [phone] in the [place] Your eyes itch badly, or they become very red or swollen. Your hearing changes. You have trouble seeing. If you can't reach your provider, go to an urgent care or emergency room. Do not drive yourself. Get help  right away if: You have swelling or tingling in your mouth or throat. You make high-pitched whistling sounds when you breathe, most often when you breathe out (wheeze). You have trouble breathing. These symptoms may be an emergency. Call 911 right away. Do not wait to see if the symptoms will go away. Do not drive yourself to the hospital. This information is not intended to replace advice given to you by your health care provider. Make sure you discuss any questions you have with your health care provider. Document Revised: 10/20/2022 Document Reviewed: 10/16/2021 Elsevier Patient Education  2024 [employer].

## 2023-10-04 NOTE — Pre-Procedure Instructions (Signed)
 Attempted pre-op phonecall. Left VM for her to call us back.

## 2023-10-07 ENCOUNTER — Ambulatory Visit (HOSPITAL_COMMUNITY): Admitting: Anesthesiology

## 2023-10-07 ENCOUNTER — Ambulatory Visit (HOSPITAL_COMMUNITY)
Admission: RE | Admit: 2023-10-07 | Discharge: 2023-10-07 | Disposition: A | Discharge status: Home / Self Care | Attending: General Surgery | Admitting: General Surgery

## 2023-10-07 ENCOUNTER — Encounter (HOSPITAL_COMMUNITY)
Admission: RE | Disposition: A | Discharge status: Home / Self Care | Payer: Self-pay | Source: Home / Self Care | Attending: General Surgery

## 2023-10-07 DIAGNOSIS — K219 Gastro-esophageal reflux disease without esophagitis: Secondary | ICD-10-CM | POA: Diagnosis not present

## 2023-10-07 DIAGNOSIS — I10 Essential (primary) hypertension: Secondary | ICD-10-CM | POA: Diagnosis not present

## 2023-10-07 DIAGNOSIS — K4030 Unilateral inguinal hernia, with obstruction, without gangrene, not specified as recurrent: Secondary | ICD-10-CM | POA: Diagnosis not present

## 2023-10-07 DIAGNOSIS — E119 Type 2 diabetes mellitus without complications: Secondary | ICD-10-CM | POA: Insufficient documentation

## 2023-10-07 DIAGNOSIS — F419 Anxiety disorder, unspecified: Secondary | ICD-10-CM | POA: Diagnosis not present

## 2023-10-07 DIAGNOSIS — J45909 Unspecified asthma, uncomplicated: Secondary | ICD-10-CM | POA: Insufficient documentation

## 2023-10-07 DIAGNOSIS — Z79899 Other long term (current) drug therapy: Secondary | ICD-10-CM | POA: Diagnosis not present

## 2023-10-07 DIAGNOSIS — G4730 Sleep apnea, unspecified: Secondary | ICD-10-CM | POA: Insufficient documentation

## 2023-10-07 DIAGNOSIS — F32A Depression, unspecified: Secondary | ICD-10-CM | POA: Insufficient documentation

## 2023-10-07 DIAGNOSIS — K4090 Unilateral inguinal hernia, without obstruction or gangrene, not specified as recurrent: Secondary | ICD-10-CM | POA: Diagnosis not present

## 2023-10-07 LAB — GLUCOSE, CAPILLARY
Glucose-Capillary: 125 mg/dL — ABNORMAL HIGH (ref 70–99)
Glucose-Capillary: 170 mg/dL — ABNORMAL HIGH (ref 70–99)
Glucose-Capillary: 178 mg/dL — ABNORMAL HIGH (ref 70–99)

## 2023-10-07 SURGERY — REPAIR, HERNIA, INGUINAL, ROBOT-ASSISTED, LAPAROSCOPIC, USING MESH
Anesthesia: General | Site: Abdomen | Laterality: Right

## 2023-10-07 MED ORDER — FENTANYL CITRATE PF 50 MCG/ML IJ SOSY
25.0000 ug | PREFILLED_SYRINGE | INTRAMUSCULAR | Status: DC | PRN
  Administered 2023-10-07: 50 ug via INTRAVENOUS
  Filled 2023-10-07: qty 1

## 2023-10-07 MED ORDER — FENTANYL CITRATE (PF) 100 MCG/2ML IJ SOLN
INTRAMUSCULAR | Status: AC
  Filled 2023-10-07: qty 2

## 2023-10-07 MED ORDER — CHLORHEXIDINE GLUCONATE 0.12 % MT SOLN
15.0000 mL | Freq: Once | OROMUCOSAL | Status: AC
  Administered 2023-10-07: 15 mL via OROMUCOSAL

## 2023-10-07 MED ORDER — HYDROMORPHONE HCL 1 MG/ML IJ SOLN
INTRAMUSCULAR | Status: DC | PRN
  Administered 2023-10-07: .5 mg via INTRAVENOUS

## 2023-10-07 MED ORDER — PHENYLEPHRINE 80 MCG/ML (10ML) SYRINGE FOR IV PUSH (FOR BLOOD PRESSURE SUPPORT)
PREFILLED_SYRINGE | INTRAVENOUS | Status: DC | PRN
  Administered 2023-10-07: 160 ug via INTRAVENOUS
  Administered 2023-10-07: 80 ug via INTRAVENOUS

## 2023-10-07 MED ORDER — ACETAMINOPHEN 10 MG/ML IV SOLN
INTRAVENOUS | Status: DC | PRN
Start: 2023-10-07 — End: 2023-10-07
  Administered 2023-10-07: 1000 mg via INTRAVENOUS

## 2023-10-07 MED ORDER — PHENYLEPHRINE 80 MCG/ML (10ML) SYRINGE FOR IV PUSH (FOR BLOOD PRESSURE SUPPORT)
PREFILLED_SYRINGE | INTRAVENOUS | Status: AC
  Filled 2023-10-07: qty 10

## 2023-10-07 MED ORDER — LACTATED RINGERS IV SOLN: INTRAVENOUS | Status: DC

## 2023-10-07 MED ORDER — MIDAZOLAM HCL 2 MG/2ML IJ SOLN
INTRAMUSCULAR | Status: AC
  Filled 2023-10-07: qty 2

## 2023-10-07 MED ORDER — CHLORHEXIDINE GLUCONATE CLOTH 2 % EX PADS: 6.0000 | MEDICATED_PAD | Freq: Once | CUTANEOUS | Status: DC

## 2023-10-07 MED ORDER — KETOROLAC TROMETHAMINE 30 MG/ML IJ SOLN
INTRAMUSCULAR | Status: DC | PRN
Start: 2023-10-07 — End: 2023-10-07
  Administered 2023-10-07: 30 mg via INTRAVENOUS

## 2023-10-07 MED ORDER — ACETAMINOPHEN 10 MG/ML IV SOLN
INTRAVENOUS | Status: AC
Start: 2023-10-07 — End: 2023-10-07
  Filled 2023-10-07: qty 100

## 2023-10-07 MED ORDER — ROCURONIUM BROMIDE 10 MG/ML (PF) SYRINGE
PREFILLED_SYRINGE | INTRAVENOUS | Status: DC | PRN
Start: 2023-10-07 — End: 2023-10-07
  Administered 2023-10-07: 70 mg via INTRAVENOUS

## 2023-10-07 MED ORDER — ORAL CARE MOUTH RINSE: 15.0000 mL | Freq: Once | OROMUCOSAL | Status: AC

## 2023-10-07 MED ORDER — ONDANSETRON HCL 4 MG/2ML IJ SOLN: 4.0000 mg | Freq: Once | INTRAMUSCULAR | Status: DC | PRN

## 2023-10-07 MED ORDER — STERILE WATER FOR IRRIGATION IR SOLN
Status: DC | PRN
  Administered 2023-10-07: 500 mL

## 2023-10-07 MED ORDER — PROPOFOL 10 MG/ML IV BOLUS
INTRAVENOUS | Status: DC | PRN
Start: 2023-10-07 — End: 2023-10-07
  Administered 2023-10-07: 150 mg via INTRAVENOUS

## 2023-10-07 MED ORDER — SUGAMMADEX SODIUM 200 MG/2ML IV SOLN
INTRAVENOUS | Status: DC | PRN
Start: 2023-10-07 — End: 2023-10-07
  Administered 2023-10-07: 200 mg via INTRAVENOUS

## 2023-10-07 MED ORDER — MIDAZOLAM HCL 2 MG/2ML IJ SOLN
INTRAMUSCULAR | Status: DC | PRN
  Administered 2023-10-07: 2 mg via INTRAVENOUS

## 2023-10-07 MED ORDER — PROPOFOL 10 MG/ML IV BOLUS
INTRAVENOUS | Status: AC
  Filled 2023-10-07: qty 20

## 2023-10-07 MED ORDER — ROCURONIUM BROMIDE 10 MG/ML (PF) SYRINGE
PREFILLED_SYRINGE | INTRAVENOUS | Status: AC
  Filled 2023-10-07: qty 10

## 2023-10-07 MED ORDER — DEXMEDETOMIDINE HCL IN NACL 80 MCG/20ML IV SOLN
INTRAVENOUS | Status: DC | PRN
Start: 2023-10-07 — End: 2023-10-07
  Administered 2023-10-07: 8 ug via INTRAVENOUS

## 2023-10-07 MED ORDER — ONDANSETRON HCL 4 MG/2ML IJ SOLN
INTRAMUSCULAR | Status: AC
  Filled 2023-10-07: qty 2

## 2023-10-07 MED ORDER — BUPIVACAINE HCL (PF) 0.5 % IJ SOLN
INTRAMUSCULAR | Status: AC
  Filled 2023-10-07: qty 30

## 2023-10-07 MED ORDER — LIDOCAINE 2% (20 MG/ML) 5 ML SYRINGE
INTRAMUSCULAR | Status: DC | PRN
  Administered 2023-10-07: 60 mg via INTRAVENOUS

## 2023-10-07 MED ORDER — FENTANYL CITRATE (PF) 100 MCG/2ML IJ SOLN
INTRAMUSCULAR | Status: DC | PRN
  Administered 2023-10-07 (×4): 50 ug via INTRAVENOUS

## 2023-10-07 MED ORDER — LIDOCAINE 2% (20 MG/ML) 5 ML SYRINGE
INTRAMUSCULAR | Status: AC
  Filled 2023-10-07: qty 5

## 2023-10-07 MED ORDER — CEFAZOLIN SODIUM-DEXTROSE 2-4 GM/100ML-% IV SOLN
2.0000 g | INTRAVENOUS | Status: AC
  Administered 2023-10-07: 2 g via INTRAVENOUS
  Filled 2023-10-07: qty 100

## 2023-10-07 MED ORDER — KETOROLAC TROMETHAMINE 30 MG/ML IJ SOLN
INTRAMUSCULAR | Status: AC
  Filled 2023-10-07: qty 1

## 2023-10-07 MED ORDER — BUPIVACAINE HCL (PF) 0.5 % IJ SOLN: INTRAMUSCULAR | Status: DC | PRN

## 2023-10-07 MED ORDER — HYDROMORPHONE HCL 1 MG/ML IJ SOLN
INTRAMUSCULAR | Status: AC
Start: 2023-10-07 — End: 2023-10-07
  Filled 2023-10-07: qty 1

## 2023-10-07 MED ORDER — ONDANSETRON HCL 4 MG/2ML IJ SOLN
INTRAMUSCULAR | Status: DC | PRN
  Administered 2023-10-07: 4 mg via INTRAVENOUS

## 2023-10-07 MED ORDER — OXYCODONE HCL 5 MG/5ML PO SOLN: 5.0000 mg | Freq: Once | ORAL | Status: DC | PRN

## 2023-10-07 MED ORDER — OXYCODONE HCL 5 MG PO TABS: 5.0000 mg | ORAL_TABLET | Freq: Once | ORAL | Status: DC | PRN

## 2023-10-07 SURGICAL SUPPLY — 39 items
CHLORAPREP W/TINT 26 (MISCELLANEOUS) ×2 IMPLANT
COVER LIGHT HANDLE STERIS (MISCELLANEOUS) ×2 IMPLANT
COVER MAYO STAND XLG (MISCELLANEOUS) ×2 IMPLANT
COVER TIP SHEARS 8 DVNC (MISCELLANEOUS) ×2 IMPLANT
DERMABOND ADVANCED .7 DNX12 (GAUZE/BANDAGES/DRESSINGS) ×2 IMPLANT
DRAPE ARM DVNC X/XI (DISPOSABLE) ×6 IMPLANT
DRAPE COLUMN DVNC XI (DISPOSABLE) ×2 IMPLANT
DRIVER NDL MEGA SUTCUT DVNCXI (INSTRUMENTS) ×2 IMPLANT
DRIVER NDLE MEGA SUTCUT DVNCXI (INSTRUMENTS) ×1 IMPLANT
ELECTRODE REM PT RTRN 9FT ADLT (ELECTROSURGICAL) ×2 IMPLANT
FORCEPS BPLR R/ABLATION 8 DVNC (INSTRUMENTS) ×2 IMPLANT
GAUZE SPONGE 4X4 12PLY STRL (GAUZE/BANDAGES/DRESSINGS) ×2 IMPLANT
GLOVE BIOGEL PI IND STRL 7.0 (GLOVE) ×8 IMPLANT
GLOVE SURG SS PI 7.5 STRL IVOR (GLOVE) ×4 IMPLANT
GOWN STRL REUS W/TWL LRG LVL3 (GOWN DISPOSABLE) ×4 IMPLANT
KIT PINK PAD W/HEAD ARM REST (MISCELLANEOUS) ×2 IMPLANT
KIT TURNOVER KIT A (KITS) ×2 IMPLANT
MANIFOLD NEPTUNE II (INSTRUMENTS) ×2 IMPLANT
MESH 3DMAX MID 5X7 RT XLRG (Mesh General) IMPLANT
NDL HYPO 21X1.5 SAFETY (NEEDLE) ×2 IMPLANT
NDL INSUFFLATION 14GA 120MM (NEEDLE) ×2 IMPLANT
NEEDLE HYPO 21X1.5 SAFETY (NEEDLE) ×1 IMPLANT
NEEDLE INSUFFLATION 14GA 120MM (NEEDLE) ×1 IMPLANT
OBTURATOR OPTICALSTD 8 DVNC (TROCAR) ×2 IMPLANT
PACK LAP CHOLE LZT030E (CUSTOM PROCEDURE TRAY) ×2 IMPLANT
PENCIL HANDSWITCHING (ELECTRODE) ×2 IMPLANT
POSITIONER HEAD 8X9X4 ADT (SOFTGOODS) ×2 IMPLANT
SCISSORS MNPLR CVD DVNC XI (INSTRUMENTS) ×2 IMPLANT
SEAL UNIV 5-12 XI (MISCELLANEOUS) ×6 IMPLANT
SET BASIN LINEN APH (SET/KITS/TRAYS/PACK) ×2 IMPLANT
SET TUBE DA VINCI INSUFFLATOR (TUBING) IMPLANT
SOL PREP POV-IOD 4OZ 10% (MISCELLANEOUS) ×2 IMPLANT
SUT MNCRL AB 4-0 PS2 18 (SUTURE) ×4 IMPLANT
SUT STRATA 3-0 SH (SUTURE) ×8 IMPLANT
SUT VIC AB 2-0 SH 27X BRD (SUTURE) ×2 IMPLANT
SYR 30ML LL (SYRINGE) ×2 IMPLANT
TAPE TRANSPORE STRL 2 31045 (GAUZE/BANDAGES/DRESSINGS) ×2 IMPLANT
TRAY FOL W/BAG SLVR 16FR STRL (SET/KITS/TRAYS/PACK) ×2 IMPLANT
WATER STERILE IRR 500ML POUR (IV SOLUTION) ×2 IMPLANT

## 2023-10-07 NOTE — Anesthesia Procedure Notes (Signed)
 Procedure Name: Intubation Date/Time: 10/07/2023 10:58 AM  Performed by: Juluis Ok, CRNAPre-anesthesia Checklist: Patient identified, Emergency Drugs available, Suction available and Patient being monitored Patient Re-evaluated:Patient Re-evaluated prior to induction Oxygen Delivery Method: Circle system utilized Preoxygenation: Pre-oxygenation with 100% oxygen Induction Type: IV induction Ventilation: Mask ventilation without difficulty Laryngoscope Size: Mac and 4 Grade View: Grade I Tube size: 7.0 mm Number of attempts: 1 Airway Equipment and Method: Stylet Placement Confirmation: ETT inserted through vocal cords under direct vision, positive ETCO2, CO2 detector and breath sounds checked- equal and bilateral Secured at: 22 cm Tube secured with: Tape Dental Injury: Teeth and Oropharynx as per pre-operative assessment  Comments: Atraumatic intubation. Patient edentulous. Lips remain in preoperative condition.

## 2023-10-07 NOTE — Op Note (Signed)
 Patient:  Lisa Todd  DOB:  1955/10/26  MRN:  [id number]   Preop Diagnosis: Right inguinal hernia  Postop Diagnosis: Same  Procedure: Robotic assisted laparoscopic right inguinal herniorrhaphy with mesh  Surgeon: Alanda Allegra, MD  Anes: General endotracheal  Indications: Patient is a 68 year old black female with a history of an incarcerated right inguinal hernia who now presents for elective repair.  The risks and benefits of the procedure including bleeding, infection, mesh use, and the possibility of recurrence of the hernia were fully explained to the patient, who gave informed consent.  Procedure note: The patient was placed in the supine position.  After induction of general endotracheal anesthesia, the abdomen was prepped and draped using the usual sterile technique with ChloraPrep.  Surgical site confirmation was performed.  An incision was made in the left upper quadrant at Palmer's point.  A Veress needle was introduced into the abdominal cavity and confirmation of placement was done using the saline drop test.  The abdomen was then insufflated to 15 mmHg pressure.  An 8 mm trocar was introduced into the abdominal cavity under direct visualization without difficulty.  Additional 8 mm trocars were placed in the upper midline and right upper quadrant regions.  The patient was placed in Trendelenburg position.  The robot was then docked and targeted.  The patient had an indirect right inguinal hernia.  No bowel was present in the hernia.  A peritoneal flap was then formed down to Cooper's ligament.  This was then taken out laterally.  The hernia sac was freed away along with the processes vaginalis.  A high ligation of the process vaginalis was performed using the vessel sealer.  Approximately 8 cm of posterior dissection from the hernia defect was performed.  An extra-large Bard 3D max mesh was then inserted and secured to Cooper's ligament using a 2-0 Vicryl interrupted suture.  A  stay suture was placed anteriorly above the hernia defect with a 2-0 Vicryl suture.  The peritoneal flap was then closed using a 3-0 stratafix running suture.  A portion of the processes vaginalis was removed from the abdominal cavity.  The robot was then undocked and all air was evacuated from the abdominal cavity prior to removal of the trocars.  All wounds were irrigated with normal saline.  All wounds were injected with 0.5% Sensorcaine .  All incisions were closed using a 4-0 Monocryl subcuticular suture.  Dermabond was applied.  All tape and needle counts were correct at the end of the procedure.  The patient was extubated in the operating room and transferred to PACU in stable condition.  Complications: None  EBL: Minimal  Specimen: None

## 2023-10-07 NOTE — Transfer of Care (Signed)
 Immediate Anesthesia Transfer of Care Note  Patient: Lisa Todd  Procedure(s) Performed: REPAIR, HERNIA, INGUINAL, ROBOT-ASSISTED, LAPAROSCOPIC, USING MESH (Right: Abdomen)  Patient Location: PACU  Anesthesia Type:General  Level of Consciousness: drowsy and patient cooperative  Airway & Oxygen Therapy: Patient Spontanous Breathing and Patient connected to face mask oxygen  Post-op Assessment: Report given to RN and Post -op Vital signs reviewed and stable  Post vital signs: Reviewed and stable  Last Vitals:  Vitals Value Taken Time  BP 132/60 10/07/23 12:30  Temp 36.4 C 10/07/23 12:30  Pulse 57 10/07/23 12:36  Resp 13 10/07/23 12:36  SpO2 100 % 10/07/23 12:36  Vitals shown include unfiled device data.  Last Pain:  Vitals:   10/07/23 0815  TempSrc: Oral  PainSc: 8       Patients Stated Pain Goal: 8 (10/07/23 0815)  Complications: No notable events documented.

## 2023-10-07 NOTE — Interval H&P Note (Signed)
 History and Physical Interval Note:  10/07/2023 10:08 AM  Lisa Todd  has presented today for surgery, with the diagnosis of INGUINAL HERNIA, RIGHT.  The various methods of treatment have been discussed with the patient and family. After consideration of risks, benefits and other options for treatment, the patient has consented to  Procedure(s): REPAIR, HERNIA, INGUINAL, ROBOT-ASSISTED, LAPAROSCOPIC, USING MESH (Right) as a surgical intervention.  The patient's history has been reviewed, patient examined, no change in status, stable for surgery.  I have reviewed the patient's chart and labs.  Questions were answered to the patient's satisfaction.     Alanda Allegra

## 2023-10-07 NOTE — Anesthesia Preprocedure Evaluation (Signed)
 Anesthesia Evaluation  Patient identified by MRN, date of birth, ID band Patient awake    Reviewed: Allergy & Precautions, H&P , NPO status , Patient's Chart, lab work & pertinent test results, reviewed documented beta blocker date and time   Airway Mallampati: II  TM Distance: >3 FB Neck ROM: full    Dental no notable dental hx.    Pulmonary asthma , sleep apnea    Pulmonary exam normal breath sounds clear to auscultation       Cardiovascular Exercise Tolerance: Good hypertension,  Rhythm:regular Rate:Normal     Neuro/Psych  PSYCHIATRIC DISORDERS Anxiety Depression    negative neurological ROS     GI/Hepatic Neg liver ROS,GERD  Medicated,,  Endo/Other  diabetes    Renal/GU Renal disease  negative genitourinary   Musculoskeletal   Abdominal   Peds  Hematology negative hematology ROS (+)   Anesthesia Other Findings   Reproductive/Obstetrics negative OB ROS                             Anesthesia Physical Anesthesia Plan  ASA: 3  Anesthesia Plan: General and General ETT   Post-op Pain Management:    Induction:   PONV Risk Score and Plan: Ondansetron   Airway Management Planned:   Additional Equipment:   Intra-op Plan:   Post-operative Plan:   Informed Consent: I have reviewed the patients History and Physical, chart, labs and discussed the procedure including the risks, benefits and alternatives for the proposed anesthesia with the patient or authorized representative who has indicated his/her understanding and acceptance.     Dental Advisory Given  Plan Discussed with: CRNA  Anesthesia Plan Comments:        Anesthesia Quick Evaluation

## 2023-10-08 ENCOUNTER — Encounter (HOSPITAL_COMMUNITY): Payer: Self-pay | Admitting: General Surgery

## 2023-10-09 NOTE — Anesthesia Postprocedure Evaluation (Signed)
 Anesthesia Post Note  Patient: LATEIA FRASER  Procedure(s) Performed: REPAIR, HERNIA, INGUINAL, ROBOT-ASSISTED, LAPAROSCOPIC, USING MESH (Right: Abdomen)  Patient location during evaluation: Phase II Anesthesia Type: General Level of consciousness: awake Pain management: pain level controlled Vital Signs Assessment: post-procedure vital signs reviewed and stable Respiratory status: spontaneous breathing and respiratory function stable Cardiovascular status: blood pressure returned to baseline and stable Postop Assessment: no headache and no apparent nausea or vomiting Anesthetic complications: no Comments: Late entry   No notable events documented.   Last Vitals:  Vitals:   10/07/23 1345 10/07/23 1353  BP: 129/64 131/60  Pulse: (!) 50 (!) 48  Resp: 10 14  Temp:  (!) 36.3 C  SpO2: 96% 100%    Last Pain:  Vitals:   10/07/23 1353  TempSrc: Oral  PainSc: 10-Worst pain ever                 Yvonna JINNY Bosworth

## 2023-10-21 ENCOUNTER — Encounter: Payer: Self-pay | Admitting: General Surgery

## 2023-10-21 ENCOUNTER — Ambulatory Visit (INDEPENDENT_AMBULATORY_CARE_PROVIDER_SITE_OTHER): Admitting: General Surgery

## 2023-10-21 ENCOUNTER — Encounter: Payer: Self-pay | Admitting: Family Medicine

## 2023-10-21 VITALS — BP 151/86 | HR 61 | Temp 97.9°F | Resp 12 | Ht 63.0 in | Wt 154.0 lb

## 2023-10-21 DIAGNOSIS — Z09 Encounter for follow-up examination after completed treatment for conditions other than malignant neoplasm: Secondary | ICD-10-CM

## 2023-10-21 NOTE — Progress Notes (Signed)
 Subjective:     Lisa Todd  Patient here for postoperative visit, status post robotic assisted laparoscopic right inguinal herniorrhaphy with mesh.  She is doing well.  She has minimal incisional pain.  Minimal right groin pain is noted. Objective:    BP (!) 151/86   Pulse 61   Temp 97.9 F (36.6 C) (Oral)   Resp 12   Ht 5' 3 (1.6 m)   Wt 154 lb (69.9 kg)   SpO2 98%   BMI 27.28 kg/m   General:  alert, cooperative, and no distress  Abdomen soft, incisions healing well.  No hematoma noted in right groin region.  No hernia present.     Assessment:    Doing well postoperatively.    Plan:   Patient may gradually increase her activity.  She may return to work without restrictions on 11/01/2023.  Follow-up here as needed.

## 2023-10-26 ENCOUNTER — Ambulatory Visit
Admission: EM | Admit: 2023-10-26 | Discharge: 2023-10-26 | Disposition: A | Discharge status: Home / Self Care | Source: Ambulatory Visit | Attending: Nurse Practitioner | Admitting: Nurse Practitioner

## 2023-10-26 DIAGNOSIS — R399 Unspecified symptoms and signs involving the genitourinary system: Secondary | ICD-10-CM | POA: Diagnosis not present

## 2023-10-26 DIAGNOSIS — R350 Frequency of micturition: Secondary | ICD-10-CM | POA: Diagnosis present

## 2023-10-26 LAB — POCT URINALYSIS DIP (MANUAL ENTRY)
Blood, UA: NEGATIVE
Glucose, UA: NEGATIVE mg/dL
Leukocytes, UA: NEGATIVE
Nitrite, UA: NEGATIVE
Protein Ur, POC: 30 mg/dL — AB
Spec Grav, UA: 1.02 (ref 1.010–1.025)
Urobilinogen, UA: 0.2 U/dL
pH, UA: 7 (ref 5.0–8.0)
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
## 2023-10-26 NOTE — ED Triage Notes (Addendum)
 Pt reports UTI sx's, urinary frequency, urinary urgency, abdominal pressure, irritation, lower back pain x 4 days

## 2023-10-26 NOTE — Discharge Instructions (Addendum)
 A urine culture has been ordered.  You will be contacted if the pending test results are abnormal.  You will also have access to your results via MyChart. Make sure you drink at least 8-10 8 ounce glasses of water  daily while symptoms persist. You may take over-the-counter Tylenol  as needed for pain or discomfort. As discussed, I am providing you information for the urologist.  Please call to schedule an appointment if your symptoms continue to persist.  You may also follow-up with your primary care physician for further evaluation. Follow-up as needed.

## 2023-10-28 ENCOUNTER — Ambulatory Visit (HOSPITAL_COMMUNITY): Payer: Self-pay

## 2023-10-28 LAB — URINE CULTURE: Culture: <10000 — AB

## 2023-11-15 ENCOUNTER — Other Ambulatory Visit (HOSPITAL_COMMUNITY): Payer: Self-pay | Admitting: Internal Medicine

## 2023-11-15 DIAGNOSIS — Z780 Asymptomatic menopausal state: Secondary | ICD-10-CM

## 2023-11-15 DIAGNOSIS — Z1231 Encounter for screening mammogram for malignant neoplasm of breast: Secondary | ICD-10-CM

## 2023-11-24 ENCOUNTER — Inpatient Hospital Stay (HOSPITAL_COMMUNITY): Admission: RE | Admit: 2023-11-24 | Source: Ambulatory Visit

## 2023-11-24 ENCOUNTER — Other Ambulatory Visit (HOSPITAL_COMMUNITY)

## 2023-12-01 ENCOUNTER — Inpatient Hospital Stay (HOSPITAL_COMMUNITY): Admission: RE | Admit: 2023-12-01 | Source: Ambulatory Visit

## 2023-12-15 ENCOUNTER — Encounter (HOSPITAL_COMMUNITY): Payer: Self-pay

## 2023-12-15 ENCOUNTER — Ambulatory Visit (HOSPITAL_COMMUNITY)
Admission: RE | Admit: 2023-12-15 | Discharge: 2023-12-15 | Disposition: A | Discharge status: Home / Self Care | Source: Ambulatory Visit | Attending: Internal Medicine | Admitting: Internal Medicine

## 2023-12-15 ENCOUNTER — Ambulatory Visit (HOSPITAL_COMMUNITY)

## 2023-12-15 DIAGNOSIS — Z1231 Encounter for screening mammogram for malignant neoplasm of breast: Secondary | ICD-10-CM

## 2023-12-15 DIAGNOSIS — Z780 Asymptomatic menopausal state: Secondary | ICD-10-CM | POA: Diagnosis present

## 2024-01-12 ENCOUNTER — Emergency Department (HOSPITAL_COMMUNITY)

## 2024-01-12 ENCOUNTER — Encounter (HOSPITAL_COMMUNITY): Payer: Self-pay | Admitting: Emergency Medicine

## 2024-01-12 ENCOUNTER — Other Ambulatory Visit: Payer: Self-pay

## 2024-01-12 ENCOUNTER — Emergency Department (HOSPITAL_COMMUNITY)
Admission: EM | Admit: 2024-01-12 | Discharge: 2024-01-12 | Disposition: A | Discharge status: Home / Self Care | Attending: Emergency Medicine | Admitting: Emergency Medicine

## 2024-01-12 DIAGNOSIS — Y990 Civilian activity done for income or pay: Secondary | ICD-10-CM | POA: Insufficient documentation

## 2024-01-12 DIAGNOSIS — M25511 Pain in right shoulder: Secondary | ICD-10-CM | POA: Diagnosis not present

## 2024-01-12 DIAGNOSIS — M5412 Radiculopathy, cervical region: Secondary | ICD-10-CM | POA: Insufficient documentation

## 2024-01-12 DIAGNOSIS — X500XXA Overexertion from strenuous movement or load, initial encounter: Secondary | ICD-10-CM | POA: Diagnosis not present

## 2024-01-12 DIAGNOSIS — Z7982 Long term (current) use of aspirin: Secondary | ICD-10-CM | POA: Diagnosis not present

## 2024-01-12 DIAGNOSIS — M542 Cervicalgia: Secondary | ICD-10-CM | POA: Diagnosis present

## 2024-01-12 MED ORDER — KETOROLAC TROMETHAMINE 30 MG/ML IJ SOLN
30.0000 mg | Freq: Once | INTRAMUSCULAR | Status: AC
  Administered 2024-01-12: 30 mg via INTRAMUSCULAR
  Filled 2024-01-12: qty 1

## 2024-01-12 MED ORDER — DEXAMETHASONE SODIUM PHOSPHATE 10 MG/ML IJ SOLN
10.0000 mg | Freq: Once | INTRAMUSCULAR | Status: AC
  Administered 2024-01-12: 10 mg via INTRAMUSCULAR
  Filled 2024-01-12: qty 1

## 2024-01-12 NOTE — ED Provider Notes (Signed)
 [facility] AT [facility] Provider Note   CSN: [id number] Arrival date & time: 01/12/24  9243     Patient presents with: Back Pain   Lisa Todd is a 68 y.o. female.  She is here with a complaint of pain in the right side of her neck through her trapezius and shoulder and down her right arm it has been going on for 2 weeks.  Worse with movement.  She does a lot of lifting at her job.  No clear trauma though.  Right hand does not feel as strong as normal.  No numbness.  Is on chronic Percocet for her arthritis in her knees.  Does not seem to help with this pain though.   The history is provided by the patient.  Arm Injury Location:  Shoulder and arm Shoulder location:  R shoulder Arm location:  R arm Injury: no   Pain details:    Quality:  Aching   Severity:  Severe   Onset quality:  Gradual   Duration:  2 weeks   Timing:  Constant   Progression:  Unchanged Handedness:  Right-handed Dislocation: no   Prior injury to area:  No Relieved by:  Nothing Worsened by:  Movement Ineffective treatments:  Narcotics Associated symptoms: back pain (upper back and shoulder) and muscle weakness   Associated symptoms: no numbness, no swelling and no tingling        Prior to Admission medications   Medication Sig Start Date End Date Taking? Authorizing Provider  aspirin EC 81 MG tablet Take 81 mg by mouth daily.   Yes [provider]  diclofenac  Sodium (VOLTAREN ) 1 % GEL Apply 2 g topically 4 (four) times daily as needed. Patient taking differently: Apply 2 g topically 4 (four) times daily as needed (for pain). 09/07/20  Yes Long, Fonda MATSU, MD  diltiazem  (CARDIZEM  LA) 360 MG 24 hr tablet Take 360 mg by mouth daily. 11/04/23  Yes [provider]  ibuprofen  (ADVIL ) 600 MG tablet Take 1 tablet (600 mg total) by mouth every 6 (six) hours as needed. Patient taking differently: Take 600 mg by mouth every 6 (six) hours as needed for mild pain  (pain score 1-3). 09/01/20  Yes Pfeiffer, Ludivina, MD  JANUVIA 100 MG tablet Take 100 mg by mouth daily. 05/06/15  Yes [provider]  lisinopril -hydrochlorothiazide  (ZESTORETIC ) 20-12.5 MG tablet Take 1 tablet by mouth daily. 10/11/23  Yes [provider]  MOUNJARO 12.5 MG/0.5ML Pen Inject 12.5 mg into the skin once a week. 01/04/24  Yes [provider]  Multiple Vitamin (MULTIVITAMIN WITH MINERALS) TABS tablet Take 1 tablet by mouth daily.   Yes [provider]  oxyCODONE -acetaminophen  (PERCOCET) 10-325 MG tablet Take 1 tablet by mouth 4 (four) times daily. 05/10/15  Yes [provider]  vitamin B-12 (CYANOCOBALAMIN ) 1000 MCG tablet Take 2,000 mcg by mouth daily.   Yes [provider]  albuterol  (PROVENTIL  HFA;VENTOLIN  HFA) 108 (90 BASE) MCG/ACT inhaler Inhale 1-2 puffs into the lungs every 6 (six) hours as needed for wheezing. Patient not taking: Reported on 01/12/2024 08/05/12   Wardell Jerel RAMAN, MD  insulin  glargine (LANTUS ) 100 UNIT/ML injection Inject 0.01-0.03 mLs (1-3 Units total) into the skin at bedtime as needed (blood sugar over 280). Patient not taking: Reported on 01/12/2024 09/14/23 04/04/36  Donnamarie Lebron PARAS, MD    Allergies: Liraglutide and Morphine     Review of Systems  Musculoskeletal:  Positive for back pain (upper back and shoulder).  Updated Vital Signs BP (!) 155/68 (BP Location: Left Arm)   Pulse 61   Resp 17   Ht 5' 3 (1.6 m)   Wt 68 kg   SpO2 100%   BMI 26.57 kg/m   Physical Exam Vitals and nursing note reviewed.  Constitutional:      General: She is not in acute distress.    Appearance: Normal appearance. She is well-developed.  HENT:     Head: Normocephalic and atraumatic.  Eyes:     Conjunctiva/sclera: Conjunctivae normal.  Cardiovascular:     Rate and Rhythm: Normal rate and regular rhythm.     Heart sounds: No murmur heard. Pulmonary:     Effort: Pulmonary effort is normal. No respiratory  distress.     Breath sounds: Normal breath sounds. No stridor. No wheezing.  Abdominal:     Palpations: Abdomen is soft.     Tenderness: There is no abdominal tenderness. There is no guarding or rebound.  Musculoskeletal:        General: Tenderness present. No deformity.     Cervical back: Neck supple.     Comments: She has some right paraspinal tenderness into her right trapezius and shoulder posteriorly.  Skin:    General: Skin is warm and dry.  Neurological:     General: No focal deficit present.     Mental Status: She is alert.     GCS: GCS eye subscore is 4. GCS verbal subscore is 5. GCS motor subscore is 6.     Sensory: No sensory deficit.     Motor: No weakness.     (all labs ordered are listed, but only abnormal results are displayed) Labs Reviewed - No data to display  EKG: None  Radiology: DG Shoulder Right Result Date: 01/12/2024 CLINICAL DATA:  Shoulder pain for 2 weeks. EXAM: RIGHT SHOULDER - 2+ VIEW COMPARISON:  None Available. FINDINGS: No acute osseous or joint abnormality. Question old injury involving the right humeral head. Degenerative changes in the right acromioclavicular joint. Visualized right chest is unremarkable with a suspected skin fold projecting over the lower lateral right hemithorax. IMPRESSION: 1. No acute findings. 2. Right acromioclavicular joint osteoarthritis. 3. Suspected old injury involving the right humeral head. Electronically Signed   By: Newell Eke M.D.   On: 01/12/2024 10:25   CT Cervical Spine Wo Contrast Result Date: 01/12/2024 EXAM: CT CERVICAL SPINE WITHOUT CONTRAST 01/12/2024 09:36:20 AM TECHNIQUE: CT of the cervical spine was performed without the administration of intravenous contrast. Multiplanar reformatted images are provided for review. Automated exposure control, iterative reconstruction, and/or weight based adjustment of the mA/kV was utilized to reduce the radiation dose to as low as reasonably achievable. COMPARISON: CT  of the cervical spine 09/01/2020. CLINICAL HISTORY: Cervical radiculopathy. Patient reports significant right arm, shoulder, and back pain. Patient denies any known injury. 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 Patient has experienced weakness in the right arm and hand as well. FINDINGS: CERVICAL SPINE: BONES AND ALIGNMENT: Straightening of the normal cervical lordosis is similar to the prior study. No acute fracture or traumatic malalignment. DEGENERATIVE CHANGES: No significant degenerative changes. SOFT TISSUES: Atherosclerotic changes at the carotid bifurcations bilaterally are similar to the prior exam. No prevertebral soft tissue swelling. IMPRESSION: 1. No acute abnormality of the cervical spine related to the clinical history of cervical radiculopathy. Electronically signed by: Lonni Necessary MD 01/12/2024 09:43 AM EDT RP Workstation: [id number]     Procedures   Medications Ordered in the ED  ketorolac  (TORADOL ) 30 MG/ML injection 30 mg (30 mg Intramuscular Given 01/12/24 1042)  dexamethasone  (DECADRON ) injection 10 mg (10 mg Intramuscular Given 01/12/24 1042)    Clinical Course as of 01/12/24 1637  Wed Jan 12, 2024  1032 Reviewed results of workup with patient.  She said she is already taking NSAIDs and muscle relaxants.  She is asking for a shot.  Will give her a shot of some Toradol  and steroids.  Recommended close follow-up with PCP. [MB]    Clinical Course User Index [MB] Towana Ozell BROCKS, MD                                 Medical Decision Making Amount and/or Complexity of Data Reviewed Radiology: ordered.  Risk Prescription drug management.   This patient complains of right-sided neck shoulder arm pain; this involves an extensive number of treatment Options and is a complaint that carries with it a high risk of complications and morbidity. The differential includes musculoskeletal pain, arthritis, radiculopathy, DVT I ordered medication IM Toradol  and  steroids and reviewed PMP when indicated. I ordered imaging studies which included right shoulder x-ray CT cervical spine and I independently    visualized and interpreted imaging which showed degenerative changes Previous records obtained and reviewed in epic including recent urgent care and PCP notes Social determinants considered, no significant barriers Critical Interventions: None  After the interventions stated above, I reevaluated the patient and found patient to be neurovascularly intact in no distress Admission and further testing considered, no indications for admission at this time.  She is already on narcotics.  Recommend NSAIDs muscle relaxants and outpatient PCP follow-up.  Return instructions discussed.      Final diagnoses:  Cervical radiculopathy  Acute pain of right shoulder    ED Discharge Orders     None          Towana Ozell BROCKS, MD 01/12/24 [phone]

## 2024-01-12 NOTE — ED Triage Notes (Signed)
 Pt bib POV w/ c/o significant right arm, shoulder and back pain. Pt denies any known injury. 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 Pt has experienced weakness in that right arm and hand as well

## 2024-01-12 NOTE — Discharge Instructions (Signed)
 Please contact your primary care doctor for close follow-up.  Continue your anti-inflammatory and pain medicine.  Return if any worsening or concerning symptoms
